# Patient Record
Sex: Female | Born: 1955 | Race: White | Hispanic: No | Marital: Married | Smoking: Never smoker
Health system: Southern US, Community
[De-identification: ages and names within clinical notes are randomized; demographics above are authoritative.]

## PROBLEM LIST (undated history)

## (undated) DIAGNOSIS — H6692 Otitis media, unspecified, left ear: Secondary | ICD-10-CM

## (undated) DIAGNOSIS — E079 Disorder of thyroid, unspecified: Secondary | ICD-10-CM

## (undated) DIAGNOSIS — E119 Type 2 diabetes mellitus without complications: Secondary | ICD-10-CM

## (undated) DIAGNOSIS — I729 Aneurysm of unspecified site: Secondary | ICD-10-CM

## (undated) DIAGNOSIS — F419 Anxiety disorder, unspecified: Secondary | ICD-10-CM

## (undated) HISTORY — PX: APPENDECTOMY: SHX54

## (undated) HISTORY — PX: DESCENDING AORTIC ANEURYSM REPAIR W/ STENT: SHX1456

---

## 2007-02-26 ENCOUNTER — Encounter (INDEPENDENT_AMBULATORY_CARE_PROVIDER_SITE_OTHER): Payer: Self-pay | Admitting: Specialist

## 2007-02-26 ENCOUNTER — Ambulatory Visit (HOSPITAL_BASED_OUTPATIENT_CLINIC_OR_DEPARTMENT_OTHER): Admission: RE | Admit: 2007-02-26 | Discharge: 2007-02-27 | Payer: Self-pay | Admitting: Specialist

## 2010-01-26 ENCOUNTER — Ambulatory Visit: Payer: Self-pay | Admitting: Radiology

## 2010-01-26 ENCOUNTER — Encounter: Payer: Self-pay | Admitting: Emergency Medicine

## 2010-01-27 ENCOUNTER — Observation Stay (HOSPITAL_COMMUNITY): Admission: EM | Admit: 2010-01-27 | Discharge: 2010-01-28 | Payer: Self-pay | Admitting: Internal Medicine

## 2010-12-14 LAB — BASIC METABOLIC PANEL
BUN: 12 mg/dL (ref 6–23)
CO2: 28 mEq/L (ref 19–32)
CO2: 26 mEq/L (ref 19–32)
Calcium: 8.9 mg/dL (ref 8.4–10.5)
Chloride: 105 mEq/L (ref 96–112)
Chloride: 102 mEq/L (ref 96–112)
Creatinine, Ser: 0.62 mg/dL (ref 0.4–1.2)
Creatinine, Ser: 0.7 mg/dL (ref 0.4–1.2)
GFR calc Af Amer: >60 mL/min (ref >60)
Glucose, Bld: 181 mg/dL — ABNORMAL HIGH (ref 70–99)
Glucose, Bld: 170 mg/dL — ABNORMAL HIGH (ref 70–99)
Potassium: 4.2 mEq/L (ref 3.5–5.1)

## 2010-12-14 LAB — COMPREHENSIVE METABOLIC PANEL
ALT: 22 U/L (ref 0–35)
AST: 30 U/L (ref 0–37)
Alkaline Phosphatase: 45 U/L (ref 39–117)
CO2: 23 mEq/L (ref 19–32)
Calcium: 8.8 mg/dL (ref 8.4–10.5)
Creatinine, Ser: 0.88 mg/dL (ref 0.4–1.2)
GFR calc non Af Amer: >60 mL/min (ref >60)
Glucose, Bld: 299 mg/dL — ABNORMAL HIGH (ref 70–99)
Potassium: 4.2 mEq/L (ref 3.5–5.1)

## 2010-12-14 LAB — CBC
HCT: 36.8 % (ref 36.0–46.0)
MCHC: 33.7 g/dL (ref 30.0–36.0)
MCV: 85.9 fL (ref 78.0–100.0)
RBC: 4.26 MIL/uL (ref 3.87–5.11)
RBC: 4.66 MIL/uL (ref 3.87–5.11)
RDW: 13.9 % (ref 11.5–15.5)
WBC: 9.1 10*3/uL (ref 4.0–10.5)

## 2010-12-14 LAB — DIFFERENTIAL
Basophils Absolute: 0 10*3/uL (ref 0.0–0.1)
Basophils Relative: 1 % (ref 0–1)
Eosinophils Absolute: 0 10*3/uL (ref 0.0–0.7)
Monocytes Absolute: 0.4 10*3/uL (ref 0.1–1.0)
Monocytes Relative: 4 % (ref 3–12)
Neutrophils Relative %: 85 % — ABNORMAL HIGH (ref 43–77)

## 2010-12-14 LAB — POCT I-STAT 3, ART BLOOD GAS (G3+)
Acid-base deficit: 2 mmol/L (ref 0.0–2.0)
O2 Saturation: 91 %
pCO2 arterial: 33.7 mmHg — ABNORMAL LOW (ref 35.0–45.0)

## 2010-12-14 LAB — GLUCOSE, CAPILLARY: Glucose-Capillary: 167 mg/dL — ABNORMAL HIGH (ref 70–99)

## 2010-12-14 LAB — HEMOGLOBIN A1C: Hgb A1c MFr Bld: 6.4 % — ABNORMAL HIGH (ref <5.7)

## 2011-02-11 NOTE — Op Note (Signed)
Amber Cruz, Amber Cruz                  ACCOUNT NO.:  [id number]   MEDICAL RECORD NO.:  [id number]          PATIENT TYPE:  AMB   LOCATION:  DSC                          FACILITY:  [facility]   PHYSICIAN:  Earvin Hansen L. Shon Hough, M.D.DATE OF BIRTH:  21-Jan-1956   DATE OF PROCEDURE:  02/26/2007  DATE OF DISCHARGE:                               OPERATIVE REPORT   HISTORY:  This patient has severe macromastia back and shoulder pain.  There are large pendulous breasts.  Has increase in accessory breast  tissue that is going around to the latissimus dorsal area up to the  axillary regions causing above intertriginous changes, chap, and  irritation.   PROCEDURES DONE:  Bilateral breast reduction using the inferior pedicle  technique.  Excision of accessory breast tissue with some liposuction  assistance.   ANESTHESIA:  General.   PREOPERATIVE:  The patient was set up and drawn for the inferior pedicle  reduction mammoplasty. We marked the nipple areolar complex back up to  20 cm from the suprasternal notch.  She then underwent general  anesthesia, intubated orally.  Prep was done to the chest, breast, and  back areas with Hibiclens soap and solution, walled off with sterile  towels and draped in satisfactory sterile field.  Xylocaine 0.25% with  epinephrine was injected locally, 1:400,000 concentration on 150 mL per  side.  This was allowed to set up.  The wound was scored then with a #10  blade.  The skin over the inferior pedicle was epithelialized with a #20  blade.  Medial and lateral fatty dermal pedicles were excised out down  to the pectoralis major fascia.  Accessory breast tissue was also  excised rectally in the lateral aspect and over the axillary and  latissimus dorsal area.  It was removed with liposuction assistance  using a [place] catheter length is 28-3 and 4's.  Removal of 150 mL of  excess breast tissue.  Appropriate hemostasis of fascia transposed and  stable with 3-0 Monocryl and  3-0 Prolene sutures.  Subcutaneous closure  was done with 3-0 Monocryl x2 layers, then ran a subcuticular stitch  with 3-0 Monocryl and 5-0 Monocryl throughout the inverted T.  The  wounds were drained with #10 Blake drains fully fluted, which were  placed in the depths of the wound to the lateral portion of the breast  and secured with 3-0 Prolene.  The wounds were cleansed.  Sterile strips  and soft dressing were applied with xeroform, 4 x 4, and ABDs,  hyperfixate.  She will then be taken to recovery for evaluation and care  and postop overnight stay.   ESTIMATED BLOOD LOSS:  Less than 100 mL.   COMPLICATIONS:  None.      Yaakov Guthrie. Shon Hough, M.D.  Electronically Signed     GLT/MEDQ  D:  02/26/2007  T:  02/26/2007  Job:  [id number]

## 2011-04-15 ENCOUNTER — Other Ambulatory Visit (HOSPITAL_COMMUNITY)
Admission: RE | Admit: 2011-04-15 | Discharge: 2011-04-15 | Disposition: A | Discharge status: Home / Self Care | Payer: Managed Care, Other (non HMO) | Source: Ambulatory Visit | Attending: Obstetrics and Gynecology | Admitting: Obstetrics and Gynecology

## 2011-04-15 ENCOUNTER — Other Ambulatory Visit: Payer: Self-pay | Admitting: Obstetrics and Gynecology

## 2011-04-15 DIAGNOSIS — Z01419 Encounter for gynecological examination (general) (routine) without abnormal findings: Secondary | ICD-10-CM | POA: Insufficient documentation

## 2014-03-26 DIAGNOSIS — F4322 Adjustment disorder with anxiety: Secondary | ICD-10-CM | POA: Insufficient documentation

## 2014-03-26 DIAGNOSIS — E039 Hypothyroidism, unspecified: Secondary | ICD-10-CM | POA: Insufficient documentation

## 2016-09-22 DIAGNOSIS — M25541 Pain in joints of right hand: Secondary | ICD-10-CM | POA: Insufficient documentation

## 2016-09-26 HISTORY — PX: MYRINGOTOMY: SHX2060

## 2017-08-11 DIAGNOSIS — E119 Type 2 diabetes mellitus without complications: Secondary | ICD-10-CM | POA: Insufficient documentation

## 2017-08-21 DIAGNOSIS — H6502 Acute serous otitis media, left ear: Secondary | ICD-10-CM | POA: Insufficient documentation

## 2017-11-10 ENCOUNTER — Emergency Department
Admission: EM | Admit: 2017-11-10 | Discharge: 2017-11-10 | Disposition: A | Discharge status: Home / Self Care | Payer: Managed Care, Other (non HMO) | Source: Home / Self Care | Attending: Emergency Medicine | Admitting: Emergency Medicine

## 2017-11-10 ENCOUNTER — Encounter: Payer: Self-pay | Admitting: Emergency Medicine

## 2017-11-10 ENCOUNTER — Emergency Department (INDEPENDENT_AMBULATORY_CARE_PROVIDER_SITE_OTHER): Payer: Managed Care, Other (non HMO)

## 2017-11-10 DIAGNOSIS — J342 Deviated nasal septum: Secondary | ICD-10-CM | POA: Diagnosis not present

## 2017-11-10 DIAGNOSIS — J101 Influenza due to other identified influenza virus with other respiratory manifestations: Secondary | ICD-10-CM

## 2017-11-10 DIAGNOSIS — R110 Nausea: Secondary | ICD-10-CM

## 2017-11-10 DIAGNOSIS — R05 Cough: Secondary | ICD-10-CM

## 2017-11-10 DIAGNOSIS — R059 Cough, unspecified: Secondary | ICD-10-CM

## 2017-11-10 HISTORY — DX: Type 2 diabetes mellitus without complications: E11.9

## 2017-11-10 LAB — POCT CBC W AUTO DIFF (K'VILLE URGENT CARE)

## 2017-11-10 LAB — POCT INFLUENZA A/B
INFLUENZA A, POC: POSITIVE — AB
INFLUENZA B, POC: NEGATIVE

## 2017-11-10 MED ORDER — ONDANSETRON 4 MG PO TBDP: 4.0000 mg | ORAL_TABLET | Freq: Three times a day (TID) | ORAL | 0 refills | Status: DC | PRN

## 2017-11-10 MED ORDER — BENZONATATE 100 MG PO CAPS: 100.0000 mg | ORAL_CAPSULE | Freq: Three times a day (TID) | ORAL | 0 refills | Status: DC | PRN

## 2017-11-10 MED ORDER — ONDANSETRON 4 MG PO TBDP
4.0000 mg | ORAL_TABLET | Freq: Once | ORAL | Status: AC
  Administered 2017-11-10: 4 mg via ORAL

## 2017-11-10 MED ORDER — OSELTAMIVIR PHOSPHATE 75 MG PO CAPS: 75.0000 mg | ORAL_CAPSULE | Freq: Two times a day (BID) | ORAL | 0 refills | Status: DC

## 2017-11-10 NOTE — ED Triage Notes (Signed)
Pt c/o cold sxs x1 week. States yesterday sinus pressure, HA and fever of 101. Tylenol this am.

## 2017-11-10 NOTE — Discharge Instructions (Addendum)
Take Tylenol every 6 hours as needed for headache. Take Tamiflu twice a day. You have medications for nausea and cough. Please drink good quantities of fluids during the day.

## 2017-11-10 NOTE — ED Provider Notes (Addendum)
[facility]    CSN: [id number] Arrival date & time: 11/10/17  0813     History   Chief Complaint Chief Complaint  Patient presents with  . Headache  Patient presents with a severe frontal headache. She states she was ill with a respiratory infectionthe last 2 weeks. This was associated with a scratchy throat and dry cough. She has a history of significant sinus problems and has a tube in her left ear. She has subsequently developed a purulent nasal drainage and at times a productive cough. Yesterday she started running fevers up to 101. She has had a flu shot. Her biggest problem has been her severe headache. Her headache has been associated with nausea but no vomiting. 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   HPI Amber Cruz is a 62 y.o. female.   HPI  Past Medical History:  Diagnosis Date  . Diabetes mellitus without complication (HCC)     There are no active problems to display for this patient.   History reviewed. No pertinent surgical history.  OB History    No data available       Home Medications    Prior to Admission medications   Medication Sig Start Date End Date Taking? Authorizing Provider  aspirin EC 81 MG tablet Take 81 mg by mouth daily.   Yes [provider]  clopidogrel (PLAVIX) 75 MG tablet Take 75 mg by mouth daily.   Yes [provider]  metFORMIN (GLUMETZA) 500 MG (MOD) 24 hr tablet Take 500 mg by mouth daily with breakfast.   Yes [provider]  sertraline (ZOLOFT) 100 MG tablet Take 100 mg by mouth daily.   Yes [provider]  benzonatate (TESSALON) 100 MG capsule Take 1-2 capsules (100-200 mg total) by mouth 3 (three) times daily as needed for cough. 11/10/17   Collene Gobbleaub, Christia Domke A, MD  ondansetron (ZOFRAN ODT) 4 MG disintegrating tablet Take 1 tablet (4 mg total) by mouth every 8 (eight) hours as  needed for nausea or vomiting. 11/10/17   Collene Gobbleaub, Kya Mayfield A, MD  oseltamivir (TAMIFLU) 75 MG capsule Take 1 capsule (75 mg total) by mouth every 12 (twelve) hours. 11/10/17   Collene Gobbleaub, Zebadiah Willert A, MD    Family History History reviewed. No pertinent family history.  Social History Social History   Tobacco Use  . Smoking status: Former Games developermoker  . Smokeless tobacco: Never Used  Substance Use Topics  . Alcohol use: No    Frequency: Never  . Drug use: Not on file     Allergies   Patient has no allergy information on record.   Review of Systems Review of Systems  Constitutional: Positive for fever.  HENT: Positive for congestion, postnasal drip, sinus pressure and sinus pain.   Eyes: Negative.   Respiratory: Positive for cough and shortness of breath. Negative for wheezing.   Cardiovascular: Negative for chest pain.  Gastrointestinal: Positive for nausea.  Endocrine:       She has a history of diabetes and checks her sugars twice a day.  Neurological: Positive for headaches.     Physical Exam Triage Vital Signs ED Triage Vitals [11/10/17 0847]  Enc Vitals Group     BP 121/73     Pulse Rate 80     Resp      Temp 98.8 F (37.1 C)     Temp Source Oral  SpO2 95 %     Weight 259 lb (117.5 kg)     Height      Head Circumference      Peak Flow      Pain Score 0     Pain Loc      Pain Edu?      Excl. in GC?    No data found.  Updated Vital Signs BP 121/73 (BP Location: Right Arm)   Pulse 80   Temp 98.8 F (37.1 C) (Oral)   Wt 259 lb (117.5 kg)   SpO2 95%   Visual Acuity Right Eye Distance:   Left Eye Distance:   Bilateral Distance:    Right Eye Near:   Left Eye Near:    Bilateral Near:     Physical Exam  Constitutional:  Patient is ill but not toxic appearing complaining of a significant frontal headache.  HENT:  Head: Normocephalic and atraumatic.  Mouth/Throat: Oropharynx is clear and moist.  Neck: Normal range of motion. Neck supple.  Cardiovascular:  Normal rate.  Pulmonary/Chest: Effort normal and breath sounds normal. No respiratory distress. She has no wheezes. She has no rales.  Abdominal: Soft.  Skin: Skin is warm and dry.   there is no maxillary sinus tenderness to palpation but there is significant tenderness over the frontal area.   UC Treatments / Results  Labs (all labs ordered are listed, but only abnormal results are displayed) Labs Reviewed  POCT INFLUENZA A/B - Abnormal; Notable for the following components:      Result Value   Influenza A, POC Positive (*)    All other components within normal limits  POCT CBC W AUTO DIFF ([facility])  POCT CBC W AUTO DIFF ([facility])   CBC done shows a white count of 6200 with 87% segs with a hemoglobin of 13.3 platelet count 194,000. EKG  EKG Interpretation None       Radiology Dg Sinuses Complete  Result Date: 11/10/2017 CLINICAL DATA:  Sinus drainage and pressure EXAM: PARANASAL SINUSES - COMPLETE 3 + VIEW COMPARISON:  None. FINDINGS: Marina Goodell, and lateral views obtained. Paranasal sinuses are clear. No air-fluid level. No bony destruction or expansion. Mastoid air cells are clear. There is slight rightward deviation of the nasal septum. IMPRESSION: Paranasal sinuses and mastoid air cells are clear. There is mild rightward deviation of the nasal septum. Electronically Signed   By: Bretta Bang III M.D.   On: 11/10/2017 10:01    Procedures Procedures (including critical care time)  Medications Ordered in UC Medications  ondansetron (ZOFRAN-ODT) disintegrating tablet 4 mg (4 mg Oral Given 11/10/17 1610)     Initial Impression / Assessment and Plan / UC Course  I have reviewed the triage vital signs and the nursing notes.  Pertinent labs & imaging results that were available during my care of the patient were reviewed by me and considered in my medical decision making (see chart for details). Patient presents with respiratory symptoms  going on for 2 weeks now yesterday she started with a fever associated with a periodic nasal drainage and mildly productive cough. Her main complaint today is of her severe headache which is in the frontal area and this area is very tender to touch. She has a tube in her left ear from being treated for chronic sinus infections. Her history is complicated by recent treatment with a coil of an aneurysm. She is currently on Plavix and aspirin for this. CBC was done and  had a normal total count but 87% segs. Flu swab will be done just because the count is normal. She is sent down for films of her sinuses. Sinus films look clear to me. Her flu test was positive for influenza A. She will be treated with Tamiflu, Tessalon Perles, and Zofran for nausea. She will take Tylenol for the headache.  sinus films are normal    Final Clinical Impressions(s) / UC Diagnoses   Final diagnoses:  Influenza A  Nausea without vomiting  Cough    ED Discharge Orders        Ordered    oseltamivir (TAMIFLU) 75 MG capsule  Every 12 hours     11/10/17 1004    benzonatate (TESSALON) 100 MG capsule  3 times daily PRN     11/10/17 1004    ondansetron (ZOFRAN ODT) 4 MG disintegrating tablet  Every 8 hours PRN     11/10/17 1004       Controlled Substance Prescriptions [place] Controlled Substance Registry consulted? Yes, I have consulted the [place] Controlled Substances Registry for this patient, and feel the risk/benefit ratio today is favorable for proceeding with this prescription for a controlled substance.   Collene Gobble, MD 11/10/17 1014    Collene Gobble, MD 11/10/17 1016

## 2017-11-16 ENCOUNTER — Encounter: Payer: Self-pay | Admitting: *Deleted

## 2017-11-16 ENCOUNTER — Emergency Department (INDEPENDENT_AMBULATORY_CARE_PROVIDER_SITE_OTHER): Payer: Managed Care, Other (non HMO)

## 2017-11-16 ENCOUNTER — Emergency Department
Admission: EM | Admit: 2017-11-16 | Discharge: 2017-11-16 | Disposition: A | Discharge status: Home / Self Care | Payer: Managed Care, Other (non HMO) | Source: Home / Self Care | Attending: Family Medicine | Admitting: Family Medicine

## 2017-11-16 ENCOUNTER — Other Ambulatory Visit: Payer: Self-pay

## 2017-11-16 DIAGNOSIS — R05 Cough: Secondary | ICD-10-CM | POA: Diagnosis not present

## 2017-11-16 DIAGNOSIS — R059 Cough, unspecified: Secondary | ICD-10-CM

## 2017-11-16 DIAGNOSIS — R0602 Shortness of breath: Secondary | ICD-10-CM

## 2017-11-16 MED ORDER — METHYLPREDNISOLONE SODIUM SUCC 125 MG IJ SOLR
80.0000 mg | Freq: Once | INTRAMUSCULAR | Status: AC
  Administered 2017-11-16: 80 mg via INTRAMUSCULAR

## 2017-11-16 MED ORDER — PREDNISONE 20 MG PO TABS: ORAL_TABLET | ORAL | 0 refills | Status: DC

## 2017-11-16 NOTE — ED Triage Notes (Signed)
Pt c/o lingering cough and SOB after flu dx last week.

## 2017-11-16 NOTE — Discharge Instructions (Signed)
Begin prednisone Friday 11/17/17. Take plain guaifenesin (1200mg  extended release tabs such as Mucinex) twice daily, with plenty of water, for cough and congestion.  May add Pseudoephedrine (30mg , one or two every 4 to 6 hours) for sinus congestion.  Get adequate rest.   May use Afrin nasal spray (or generic oxymetazoline) once daily for about 5 days and then discontinue.  Also recommend using saline nasal spray several times daily and saline nasal irrigation (AYR is a common brand).  Use Fluticasone nasal spray after using Afrin nasal spray and saline nasal irrigation. May take Delsym Cough Suppressant at bedtime for nighttime cough.  May take Tessalon perle with the Delsym Stop all antihistamines for now, and other non-prescription cough/cold preparations.

## 2017-11-16 NOTE — ED Provider Notes (Signed)
[name]    CSN: [id number] Arrival date & time: 11/16/17  1117     History   Chief Complaint Chief Complaint  Patient presents with  . Cough    HPI Amber Cruz is a 62 y.o. female.   Patient had the flu last week, and complains of persistent cough with tightness in her anterior chest.  She felt somewhat better yesterday.  No pleuritic pain but feels mild shortness of breath.  Her ears feel full.  She has a history of otitis media and has a left T-tube in place.  She notes that URI's tend to linger.   The history is provided by the patient.    Past Medical History:  Diagnosis Date  . Diabetes mellitus without complication (HCC)     There are no active problems to display for this patient.   History reviewed. No pertinent surgical history.  OB History    No data available       Home Medications    Prior to Admission medications   Medication Sig Start Date End Date Taking? Authorizing Provider  aspirin EC 81 MG tablet Take 81 mg by mouth daily.   Yes [provider]  clopidogrel (PLAVIX) 75 MG tablet Take 75 mg by mouth daily.   Yes [provider]  levothyroxine (SYNTHROID, LEVOTHROID) 137 MCG tablet Take 137 mcg by mouth daily before breakfast.   Yes [provider]  metFORMIN (GLUMETZA) 500 MG (MOD) 24 hr tablet Take 500 mg by mouth daily with breakfast.    [provider]  ondansetron (ZOFRAN ODT) 4 MG disintegrating tablet Take 1 tablet (4 mg total) by mouth every 8 (eight) hours as needed for nausea or vomiting. 11/10/17   Collene Gobble, MD  predniSONE (DELTASONE) 20 MG tablet Take one tab by mouth twice daily for 4 days, then one daily for 3 days. Take with food. 11/16/17   Lattie Haw, MD  sertraline (ZOLOFT) 100 MG tablet Take 100 mg by mouth daily.    [provider]    Family History History reviewed. No pertinent family history.  Social History Social History   Tobacco Use  .  Smoking status: Former Games developer  . Smokeless tobacco: Never Used  Substance Use Topics  . Alcohol use: No    Frequency: Never  . Drug use: No     Allergies   Patient has no known allergies.   Review of Systems Review of Systems No sore throat + cough No pleuritic pain, but feels tight in anterior chest No wheezing + nasal congestion + post-nasal drainage No sinus pain/pressure No itchy/red eyes No earache, but ears feel clogged. No hemoptysis No SOB No fever/chills No nausea No vomiting No abdominal pain No diarrhea No urinary symptoms No skin rash + fatigue No myalgias No headache Used OTC meds without relief   Physical Exam Triage Vital Signs ED Triage Vitals  Enc Vitals Group     BP 11/16/17 1136 136/79     Pulse Rate 11/16/17 1136 67     Resp 11/16/17 1136 16     Temp 11/16/17 1136 98.2 F (36.8 C)     Temp Source 11/16/17 1136 Oral     SpO2 11/16/17 1136 95 %     Weight 11/16/17 1138 260 lb (117.9 kg)     Height 11/16/17 1138 5\' 6"  (1.676 m)     Head Circumference --      Peak Flow --  Pain Score 11/16/17 1138 0     Pain Loc --      Pain Edu? --      Excl. in GC? --    No data found.  Updated Vital Signs BP 136/79   Pulse 67   Temp 98.2 F (36.8 C) (Oral)   Resp 16   Ht 5\' 6"  (1.676 m)   Wt 260 lb (117.9 kg)   SpO2 95%   BMI 41.97 kg/m   Visual Acuity Right Eye Distance:   Left Eye Distance:   Bilateral Distance:    Right Eye Near:   Left Eye Near:    Bilateral Near:     Physical Exam Nursing notes and Vital Signs reviewed. Appearance:  Patient appears stated age, and in no acute distress Eyes:  Pupils are equal, round, and reactive to light and accomodation.  Extraocular movement is intact.  Conjunctivae are not inflamed  Ears:  Canals normal.  Right tympanic membrane normal.  Left tympanic membrane has T-tube in place without drainage. Nose:  Mildly congested turbinates.  No sinus tenderness.    Pharynx:  Normal Neck:   Supple.  No adenopathy.  Lungs:  Clear to auscultation.  Breath sounds are equal.  Moving air well. Heart:  Regular rate and rhythm without murmurs, rubs, or gallops.  Abdomen:  Nontender without masses or hepatosplenomegaly.  Bowel sounds are present.  No CVA or flank tenderness.  Extremities:  No edema.  Skin:  No rash present.    UC Treatments / Results  Labs (all labs ordered are listed, but only abnormal results are displayed) Labs Reviewed - No data to display  EKG  EKG Interpretation None       Radiology Dg Chest 2 View  Result Date: 11/16/2017 CLINICAL DATA:  Cough and short of breath EXAM: CHEST  2 VIEW COMPARISON:  08/25/2015 FINDINGS: The heart size and mediastinal contours are within normal limits. Both lungs are clear. The visualized skeletal structures are unremarkable. IMPRESSION: No active cardiopulmonary disease. Electronically Signed   By: Marlan Palauharles  Clark M.D.   On: 11/16/2017 11:56    Procedures Procedures (including critical care time)  Medications Ordered in UC Medications  methylPREDNISolone sodium succinate (SOLU-MEDROL) 125 mg/2 mL injection 80 mg (80 mg Intramuscular Given 11/16/17 1242)     Initial Impression / Assessment and Plan / UC Course  I have reviewed the triage vital signs and the nursing notes.  Pertinent labs & imaging results that were available during my care of the patient were reviewed by me and considered in my medical decision making (see chart for details).    Influenza resolving.  Negative chest X-ray reassuring.  Appears to have post-infectious cough. Administered Solumedrol 80mg  IM. Begin prednisone burst/taper Friday 11/17/17. Take plain guaifenesin (1200mg  extended release tabs such as Mucinex) twice daily, with plenty of water, for cough and congestion.  May add Pseudoephedrine (30mg , one or two every 4 to 6 hours) for sinus congestion.  Get adequate rest.   May use Afrin nasal spray (or generic oxymetazoline) once daily for  about 5 days and then discontinue.  Also recommend using saline nasal spray several times daily and saline nasal irrigation (AYR is a common brand).  Use Fluticasone nasal spray after using Afrin nasal spray and saline nasal irrigation. May take Delsym Cough Suppressant at bedtime for nighttime cough.  May take Tessalon perle with the Delsym Stop all antihistamines for now, and other non-prescription cough/cold preparations. Followup with Family Doctor if not improved in  one week.     Final Clinical Impressions(s) / UC Diagnoses   Final diagnoses:  Cough    ED Discharge Orders        Ordered    predniSONE (DELTASONE) 20 MG tablet     11/16/17 1239          Lattie Haw, MD 11/19/17 2038

## 2018-01-29 DIAGNOSIS — I671 Cerebral aneurysm, nonruptured: Secondary | ICD-10-CM | POA: Insufficient documentation

## 2018-05-09 ENCOUNTER — Other Ambulatory Visit: Payer: Self-pay

## 2018-05-09 ENCOUNTER — Emergency Department
Admission: EM | Admit: 2018-05-09 | Discharge: 2018-05-09 | Disposition: A | Discharge status: Home / Self Care | Payer: Managed Care, Other (non HMO) | Source: Home / Self Care | Attending: Family Medicine | Admitting: Family Medicine

## 2018-05-09 DIAGNOSIS — H9202 Otalgia, left ear: Secondary | ICD-10-CM | POA: Diagnosis not present

## 2018-05-09 DIAGNOSIS — Z9622 Myringotomy tube(s) status: Secondary | ICD-10-CM

## 2018-05-09 MED ORDER — AMOXICILLIN-POT CLAVULANATE 875-125 MG PO TABS: 1.0000 | ORAL_TABLET | Freq: Two times a day (BID) | ORAL | 0 refills | Status: DC

## 2018-05-09 NOTE — ED Triage Notes (Signed)
Tube was placed in left ear in January.  About 10 days ago, started having pain in her left ear.  Sunday had a small amount of bloody drainage.  Today pain has been constant.

## 2018-05-09 NOTE — ED Provider Notes (Signed)
[name]    CSN: [id number] Arrival date & time: 05/09/18  1915     History   Chief Complaint Chief Complaint  Patient presents with  . Otalgia    HPI Amber Cruz is a 62 y.o. female.   HPI  Amber Cruz is a 62 y.o. female presenting to UC with c/o Left ear pain for about 10 days.  She had a small amount of bloody drainage 3 days ago on her pillow.  Today the pain has been a constant ache, radiating down the Left side of her face.  Denies fever, chills, cough. She has mild congestion. She did have an ear tube placed in her Left ear in January 2019.  This is her second set in two years. The first set lasted about 8-9 months before falling out on its own.    Past Medical History:  Diagnosis Date  . Diabetes mellitus without complication (HCC)     There are no active problems to display for this patient.   History reviewed. No pertinent surgical history.  OB History   None      Home Medications    Prior to Admission medications   Medication Sig Start Date End Date Taking? Authorizing Provider  amoxicillin-clavulanate (AUGMENTIN) 875-125 MG tablet Take 1 tablet by mouth 2 (two) times daily. One po bid x 7 days 05/09/18   Lurene Shadow, PA-C  aspirin EC 81 MG tablet Take 81 mg by mouth daily.    [provider]  clopidogrel (PLAVIX) 75 MG tablet Take 75 mg by mouth daily.    [provider]  levothyroxine (SYNTHROID, LEVOTHROID) 137 MCG tablet Take 137 mcg by mouth daily before breakfast.    [provider]  metFORMIN (GLUMETZA) 500 MG (MOD) 24 hr tablet Take 500 mg by mouth daily with breakfast.    [provider]  ondansetron (ZOFRAN ODT) 4 MG disintegrating tablet Take 1 tablet (4 mg total) by mouth every 8 (eight) hours as needed for nausea or vomiting. 11/10/17   Collene Gobble, MD  predniSONE (DELTASONE) 20 MG tablet Take one tab by mouth twice daily for 4 days, then one daily for 3 days. Take with food. 11/16/17    Lattie Haw, MD  sertraline (ZOLOFT) 100 MG tablet Take 100 mg by mouth daily.    [provider]    Family History History reviewed. No pertinent family history.  Social History Social History   Tobacco Use  . Smoking status: Former Games developer  . Smokeless tobacco: Never Used  Substance Use Topics  . Alcohol use: No    Frequency: Never  . Drug use: No     Allergies   Patient has no known allergies.   Review of Systems Review of Systems  Constitutional: Negative for chills and fever.  HENT: Positive for congestion ( minimal), ear pain ( Left) and sinus pressure ( Left side). Negative for postnasal drip, rhinorrhea, sinus pain and sore throat.   Respiratory: Negative for cough and wheezing.   Gastrointestinal: Negative for nausea and vomiting.  Neurological: Negative for dizziness, light-headedness and headaches.     Physical Exam Triage Vital Signs ED Triage Vitals [05/09/18 1950]  Enc Vitals Group     BP 124/75     Pulse Rate 63     Resp      Temp 97.8 F (36.6 C)     Temp Source Oral     SpO2 95 %     Weight 225  lb (102.1 kg)     Height 5\' 6"  (1.676 m)     Head Circumference      Peak Flow      Pain Score 8     Pain Loc      Pain Edu?      Excl. in GC?    No data found.  Updated Vital Signs BP 124/75 (BP Location: Right Arm)   Pulse 63   Temp 97.8 F (36.6 C) (Oral)   Ht 5\' 6"  (1.676 m)   Wt 225 lb (102.1 kg)   SpO2 95%   BMI 36.32 kg/m   Visual Acuity Right Eye Distance:   Left Eye Distance:   Bilateral Distance:    Right Eye Near:   Left Eye Near:    Bilateral Near:     Physical Exam  Constitutional: She is oriented to person, place, and time. She appears well-developed and well-nourished.  HENT:  Head: Normocephalic and atraumatic.  Right Ear: Tympanic membrane normal.  Left Ear: Tympanic membrane is not erythematous and not bulging.  Left: tube in place, appears to be slightly at an angle. Scant dried blood below tube  but no active bleeding or drainage. TM- besides tube in place, appears normal. No erythema or bulging.   Eyes: EOM are normal.  Neck: Normal range of motion. Neck supple.  Cardiovascular: Normal rate and regular rhythm.  Pulmonary/Chest: Effort normal and breath sounds normal. No stridor. No respiratory distress. She has no wheezes. She has no rales.  Musculoskeletal: Normal range of motion.  Neurological: She is alert and oriented to person, place, and time.  Skin: Skin is warm and dry.  Psychiatric: She has a normal mood and affect. Her behavior is normal.  Nursing note and vitals reviewed.    UC Treatments / Results  Labs (all labs ordered are listed, but only abnormal results are displayed) Labs Reviewed - No data to display  EKG None  Radiology No results found.  Procedures Procedures (including critical care time)  Medications Ordered in UC Medications - No data to display  Initial Impression / Assessment and Plan / UC Course  I have reviewed the triage vital signs and the nursing notes.  Pertinent labs & imaging results that were available during my care of the patient were reviewed by me and considered in my medical decision making (see chart for details).     No evidence of infection at this time. Question if tube is starting to work its way out because it is currently at an angle but still in place. No bleeding or drainage. No erythema Encouraged  Symptomatic treatment at this time.  Prescription to hold with expiration date for Augmentin.  Final Clinical Impressions(s) / UC Diagnoses   Final diagnoses:  Otalgia of left ear  History of placement of ear tubes     Discharge Instructions      You do not appear to have an ear infection at this time but you may be developing an early sinus infection and the ear tube is at a slight angle, it may be gradually working its way out but there is currently no active bleeding or drainage.  If you develop a fever,  worsening pain, or no improvement of pain over the next 3-5 days, you may start the antibiotic. If you start taking the antibiotic, please complete the entire course, even if you start to feel better, to ensure the infection does not come back.  If not improving in 7-10 days,  please follow up with family medicine or your ENT.    ED Prescriptions    Medication Sig Dispense Auth. Provider   amoxicillin-clavulanate (AUGMENTIN) 875-125 MG tablet Take 1 tablet by mouth 2 (two) times daily. One po bid x 7 days 14 tablet Lurene ShadowPhelps, Alicianna Litchford O, [place]PA-C     Controlled Substance Prescriptions [place] Controlled Substance Registry consulted? Not Applicable   Rolla Platehelps, Kimyah Frein O, PA-C 05/10/18 1408

## 2018-05-09 NOTE — Discharge Instructions (Signed)
°  You do not appear to have an ear infection at this time but you may be developing an early sinus infection and the ear tube is at a slight angle, it may be gradually working its way out but there is currently no active bleeding or drainage.  If you develop a fever, worsening pain, or no improvement of pain over the next 3-5 days, you may start the antibiotic. If you start taking the antibiotic, please complete the entire course, even if you start to feel better, to ensure the infection does not come back.  If not improving in 7-10 days, please follow up with family medicine or your ENT.

## 2018-07-10 ENCOUNTER — Emergency Department (INDEPENDENT_AMBULATORY_CARE_PROVIDER_SITE_OTHER): Payer: Managed Care, Other (non HMO)

## 2018-07-10 ENCOUNTER — Emergency Department
Admission: EM | Admit: 2018-07-10 | Discharge: 2018-07-10 | Disposition: A | Discharge status: Home / Self Care | Payer: Managed Care, Other (non HMO) | Source: Home / Self Care | Attending: Family Medicine | Admitting: Family Medicine

## 2018-07-10 ENCOUNTER — Encounter: Payer: Self-pay | Admitting: Emergency Medicine

## 2018-07-10 ENCOUNTER — Other Ambulatory Visit: Payer: Self-pay

## 2018-07-10 DIAGNOSIS — J069 Acute upper respiratory infection, unspecified: Secondary | ICD-10-CM | POA: Diagnosis not present

## 2018-07-10 DIAGNOSIS — B9789 Other viral agents as the cause of diseases classified elsewhere: Secondary | ICD-10-CM | POA: Diagnosis not present

## 2018-07-10 DIAGNOSIS — R05 Cough: Secondary | ICD-10-CM

## 2018-07-10 DIAGNOSIS — R062 Wheezing: Secondary | ICD-10-CM

## 2018-07-10 MED ORDER — METHYLPREDNISOLONE SODIUM SUCC 40 MG IJ SOLR
80.0000 mg | Freq: Once | INTRAMUSCULAR | Status: AC
  Administered 2018-07-10: 80 mg via INTRAMUSCULAR

## 2018-07-10 MED ORDER — PREDNISONE 20 MG PO TABS: ORAL_TABLET | ORAL | 0 refills | Status: DC

## 2018-07-10 NOTE — ED Triage Notes (Signed)
Cough, ear pain, congestion x 3 days

## 2018-07-10 NOTE — ED Provider Notes (Signed)
[facility]    CSN: [id number] Arrival date & time: 07/10/18  0955     History   Chief Complaint Chief Complaint  Patient presents with  . Cough    HPI Amber Cruz is a 62 y.o. female.   HPI Amber Cruz is a 62 y.o. female presenting to UC with c/o 3 days cough, congestion with mild chest tightness/soreness from cough, and ear pain. Associated nasal congestion and post-nasal drip. Denies fever but did have chills this morning. 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  No prior hx of asthma or pneumonia but she has done well with steroid shot and prednisone for cough in the past.     Past Medical History:  Diagnosis Date  . Diabetes mellitus without complication (HCC)     There are no active problems to display for this patient.   History reviewed. No pertinent surgical history.  OB History   None      Home Medications    Prior to Admission medications   Medication Sig Start Date End Date Taking? Authorizing Provider  aspirin EC 81 MG tablet Take 81 mg by mouth daily.    [provider]  clopidogrel (PLAVIX) 75 MG tablet Take 75 mg by mouth daily.    [provider]  levothyroxine (SYNTHROID, LEVOTHROID) 137 MCG tablet Take 137 mcg by mouth daily before breakfast.    [provider]  metFORMIN (GLUMETZA) 500 MG (MOD) 24 hr tablet Take 500 mg by mouth daily with breakfast.    [provider]  predniSONE (DELTASONE) 20 MG tablet 3 tabs po day one, then 2 po daily x 4 days 07/10/18   Lurene Shadow, PA-C  sertraline (ZOLOFT) 100 MG tablet Take 100 mg by mouth daily.    [provider]    Family History No family history on file.  Social History Social History   Tobacco Use  . Smoking status: Former Games developer  . Smokeless tobacco: Never Used  Substance Use Topics  . Alcohol use: No    Frequency: Never  . Drug use: No     Allergies   Patient has no  known allergies.   Review of Systems Review of Systems  Constitutional: Positive for chills. Negative for fever.  HENT: Positive for congestion, ear pain, postnasal drip and sore throat. Negative for trouble swallowing and voice change.   Respiratory: Positive for cough. Negative for shortness of breath.   Cardiovascular: Negative for chest pain and palpitations.  Gastrointestinal: Negative for abdominal pain, diarrhea, nausea and vomiting.  Musculoskeletal: Negative for arthralgias, back pain and myalgias.  Skin: Negative for rash.     Physical Exam Triage Vital Signs ED Triage Vitals  Enc Vitals Group     BP 07/10/18 1007 119/81     Pulse Rate 07/10/18 1007 71     Resp --      Temp 07/10/18 1007 99.1 F (37.3 C)     Temp Source 07/10/18 1007 Oral     SpO2 07/10/18 1007 96 %     Weight 07/10/18 1008 245 lb (111.1 kg)     Height 07/10/18 1008 5\' 6"  (1.676 m)     Head Circumference --      Peak Flow --      Pain Score 07/10/18 1008 2     Pain Loc --      Pain Edu? --      Excl. in GC? --  No data found.  Updated Vital Signs BP 119/81 (BP Location: Right Arm)   Pulse 71   Temp 99.1 F (37.3 C) (Oral)   Ht 5\' 6"  (1.676 m)   Wt 245 lb (111.1 kg)   SpO2 96%   BMI 39.54 kg/m   Visual Acuity Right Eye Distance:   Left Eye Distance:   Bilateral Distance:    Right Eye Near:   Left Eye Near:    Bilateral Near:     Physical Exam  Constitutional: She is oriented to person, place, and time. She appears well-developed and well-nourished. No distress.  HENT:  Head: Normocephalic and atraumatic.  Right Ear: Tympanic membrane normal.  Left Ear: Tympanic membrane normal.  Nose: Nose normal. Right sinus exhibits no maxillary sinus tenderness and no frontal sinus tenderness. Left sinus exhibits no maxillary sinus tenderness and no frontal sinus tenderness.  Mouth/Throat: Uvula is midline, oropharynx is clear and moist and mucous membranes are normal.  Eyes: EOM are  normal.  Neck: Normal range of motion. Neck supple.  Cardiovascular: Normal rate and regular rhythm.  Pulmonary/Chest: Effort normal. No stridor. No respiratory distress. She has wheezes ( lower lung fields bilaterally, Right > Left).  Musculoskeletal: Normal range of motion.  Lymphadenopathy:    She has no cervical adenopathy.  Neurological: She is alert and oriented to person, place, and time.  Skin: Skin is warm and dry. She is not diaphoretic.  Psychiatric: She has a normal mood and affect. Her behavior is normal.  Nursing note and vitals reviewed.    UC Treatments / Results  Labs (all labs ordered are listed, but only abnormal results are displayed) Labs Reviewed - No data to display  EKG None  Radiology Dg Chest 2 View  Result Date: 07/10/2018 CLINICAL DATA:  Cough, congestion, wheezing EXAM: CHEST - 2 VIEW COMPARISON:  11/16/2017 FINDINGS: Heart is enlarged. Persistent stable linear densities in the lung bases, likely scarring. No effusions or acute confluent airspace opacities. No acute bony abnormality. IMPRESSION: Stable mild cardiomegaly. Stable bibasilar scarring. No active disease. Electronically Signed   By: Charlett Nose M.D.   On: 07/10/2018 10:48    Procedures Procedures (including critical care time)  Medications Ordered in UC Medications  methylPREDNISolone sodium succinate (SOLU-MEDROL) 40 mg/mL injection 80 mg (80 mg Intramuscular Given 07/10/18 1124)    Initial Impression / Assessment and Plan / UC Course  I have reviewed the triage vital signs and the nursing notes.  Pertinent labs & imaging results that were available during my care of the patient were reviewed by me and considered in my medical decision making (see chart for details).     Reviewed CXR with pt Will tx for viral illness Solumedrol 80mg  IM given in UC Home care instructions provided.  Final Clinical Impressions(s) / UC Diagnoses   Final diagnoses:  Viral URI with cough      Discharge Instructions      Please follow up with family medicine in 1 week if not improving.     ED Prescriptions    Medication Sig Dispense Auth. Provider   predniSONE (DELTASONE) 20 MG tablet 3 tabs po day one, then 2 po daily x 4 days 11 tablet Lurene Shadow, PA-C     Controlled Substance Prescriptions [place] Controlled Substance Registry consulted? Not Applicable   Rolla Plate 07/10/18 1130

## 2018-07-10 NOTE — Discharge Instructions (Signed)
°  Please follow up with family medicine in 1 week if not improving. °

## 2018-11-22 ENCOUNTER — Telehealth: Payer: Self-pay

## 2018-11-22 NOTE — Telephone Encounter (Signed)
Copied from CRM [id number]. Topic: Appointment Scheduling - New Patient >> Nov 21, 2018 12:16 PM [name] E wrote: New patient would like to be scheduled for your office. Provider: Dr. Abner Greenspan Pt was referred by Gabriel Rung and Gardiner Coins and Pt's PCP Dr. Lendon Colonel is retiring and referred her to Dr. Abner Greenspan as well / please advise   Route to department's PEC pool.  \  Kirsten- Dr. Abner Greenspan is currently not accepting new patients at this time.  Please let patient know

## 2018-11-23 NOTE — Telephone Encounter (Signed)
CALLED PATIENT TO INFORM OF DR. Abner Greenspan NOT ACCEPTING NEW PATIENTS. Informed patient of Dr. Carmelia Roller and Esperanza Richters who are accepting patients. She stated she wanted to think on it and call us back. Informed of [place] not prescribing controlled substances.

## 2019-01-01 ENCOUNTER — Encounter: Payer: Self-pay | Admitting: Emergency Medicine

## 2019-01-01 ENCOUNTER — Other Ambulatory Visit: Payer: Self-pay

## 2019-01-01 ENCOUNTER — Emergency Department
Admission: EM | Admit: 2019-01-01 | Discharge: 2019-01-01 | Disposition: A | Discharge status: Home / Self Care | Payer: Managed Care, Other (non HMO) | Source: Home / Self Care

## 2019-01-01 DIAGNOSIS — R3915 Urgency of urination: Secondary | ICD-10-CM

## 2019-01-01 DIAGNOSIS — N3289 Other specified disorders of bladder: Secondary | ICD-10-CM | POA: Diagnosis not present

## 2019-01-01 LAB — POCT URINALYSIS DIP (MANUAL ENTRY)
Bilirubin, UA: NEGATIVE
Blood, UA: NEGATIVE
Glucose, UA: NEGATIVE mg/dL
Ketones, POC UA: NEGATIVE mg/dL
Leukocytes, UA: NEGATIVE
Nitrite, UA: NEGATIVE
Protein Ur, POC: NEGATIVE mg/dL
Spec Grav, UA: 1.015 (ref 1.010–1.025)
Urobilinogen, UA: 0.2 E.U./dL
pH, UA: 7 (ref 5.0–8.0)

## 2019-01-01 MED ORDER — PHENAZOPYRIDINE HCL 200 MG PO TABS: 200.0000 mg | ORAL_TABLET | Freq: Three times a day (TID) | ORAL | 0 refills | Status: DC

## 2019-01-01 NOTE — ED Provider Notes (Signed)
[name]    CSN: [id number] Arrival date & time: 01/01/19  0840     History   Chief Complaint Chief Complaint  Patient presents with  . Polyuria    HPI Amber Cruz is a 63 y.o. female.   HPI Amber Cruz is a 63 y.o. female presenting to UC with c/o bladder spasms with urinary frequency and urgency for the last 3-4 days. Hx of overactive bladder about 2 years ago.  Denies fever, chills, abdominal pain, back pain, or pain with urination. No medication tried PTA. Pt believes she was prescribed pyridium last time she had similar symptoms.   Past Medical History:  Diagnosis Date  . Diabetes mellitus without complication (HCC)     There are no active problems to display for this patient.   History reviewed. No pertinent surgical history.  OB History   No obstetric history on file.      Home Medications    Prior to Admission medications   Medication Sig Start Date End Date Taking? Authorizing Provider  aspirin EC 81 MG tablet Take 81 mg by mouth daily.    [provider]  levothyroxine (SYNTHROID, LEVOTHROID) 137 MCG tablet Take 137 mcg by mouth daily before breakfast.    [provider]  phenazopyridine (PYRIDIUM) 200 MG tablet Take 1 tablet (200 mg total) by mouth 3 (three) times daily. 01/01/19   Lurene Shadow, PA-C  sertraline (ZOLOFT) 100 MG tablet Take 100 mg by mouth daily.    [provider]    Family History History reviewed. No pertinent family history.  Social History Social History   Tobacco Use  . Smoking status: Former Games developer  . Smokeless tobacco: Never Used  Substance Use Topics  . Alcohol use: No    Frequency: Never  . Drug use: No     Allergies   Patient has no known allergies.   Review of Systems Review of Systems  Constitutional: Negative for chills and fever.  Gastrointestinal: Negative for abdominal pain, diarrhea, nausea and vomiting.  Genitourinary: Positive for frequency and  urgency. Negative for dysuria, flank pain, hematuria and pelvic pain.  Neurological: Negative for dizziness, light-headedness and headaches.     Physical Exam Triage Vital Signs ED Triage Vitals  Enc Vitals Group     BP 01/01/19 0923 124/83     Pulse Rate 01/01/19 0923 73     Resp --      Temp 01/01/19 0923 98.8 F (37.1 C)     Temp Source 01/01/19 0923 Oral     SpO2 01/01/19 0923 96 %     Weight 01/01/19 0924 225 lb (102.1 kg)     Height 01/01/19 0924 5\' 6"  (1.676 m)     Head Circumference --      Peak Flow --      Pain Score 01/01/19 0924 6     Pain Loc --      Pain Edu? --      Excl. in GC? --    No data found.  Updated Vital Signs BP 124/83 (BP Location: Right Arm)   Pulse 73   Temp 98.8 F (37.1 C) (Oral)   Ht 5\' 6"  (1.676 m)   Wt 225 lb (102.1 kg)   SpO2 96%   BMI 36.32 kg/m   Visual Acuity Right Eye Distance:   Left Eye Distance:   Bilateral Distance:    Right Eye Near:   Left Eye Near:    Bilateral Near:  Physical Exam Vitals signs and nursing note reviewed.  Constitutional:      Appearance: Normal appearance. She is well-developed.  HENT:     Head: Normocephalic and atraumatic.  Neck:     Musculoskeletal: Normal range of motion.  Cardiovascular:     Rate and Rhythm: Normal rate and regular rhythm.  Pulmonary:     Effort: Pulmonary effort is normal.     Breath sounds: Normal breath sounds.  Abdominal:     General: There is no distension.     Palpations: Abdomen is soft.     Tenderness: There is no abdominal tenderness. There is no right CVA tenderness or left CVA tenderness.  Musculoskeletal: Normal range of motion.  Skin:    General: Skin is warm and dry.  Neurological:     Mental Status: She is alert and oriented to person, place, and time.  Psychiatric:        Behavior: Behavior normal.      UC Treatments / Results  Labs (all labs ordered are listed, but only abnormal results are displayed) Labs Reviewed  URINE CULTURE     EKG None  Radiology No results found.  Procedures Procedures (including critical care time)  Medications Ordered in UC Medications - No data to display  Initial Impression / Assessment and Plan / UC Course  I have reviewed the triage vital signs and the nursing notes.  Pertinent labs & imaging results that were available during my care of the patient were reviewed by me and considered in my medical decision making (see chart for details).     UA: no evidence of UTI Will tx symptomatically   Final Clinical Impressions(s) / UC Diagnoses   Final diagnoses:  Bladder spasms  Urinary urgency     Discharge Instructions      Please take this medication as prescribed.  Please call your family doctor later this week if not improving.  Limit or avoid caffeine which can cause urinary symptoms to worsen.    ED Prescriptions    Medication Sig Dispense Auth. Provider   phenazopyridine (PYRIDIUM) 200 MG tablet Take 1 tablet (200 mg total) by mouth 3 (three) times daily. 6 tablet Lurene ShadowPhelps, Jory Welke O, PA-C     Controlled Substance Prescriptions [place] Controlled Substance Registry consulted? Not Applicable   Rolla Platehelps, Tinamarie Przybylski O, PA-C 01/01/19 1006

## 2019-01-01 NOTE — ED Triage Notes (Signed)
Bladder spasms x 4 days

## 2019-01-01 NOTE — Discharge Instructions (Signed)
°  Please take this medication as prescribed.  Please call your family doctor later this week if not improving.  Limit or avoid caffeine which can cause urinary symptoms to worsen.

## 2019-01-03 ENCOUNTER — Telehealth: Payer: Self-pay

## 2019-01-03 LAB — URINE CULTURE
MICRO NUMBER:: 380385
SPECIMEN QUALITY:: ADEQUATE

## 2019-01-03 NOTE — Telephone Encounter (Signed)
Called patient today in regards to labwork from recent visit.  No answer, left voicemail to return call

## 2019-01-03 NOTE — Telephone Encounter (Signed)
Patient returned call, informed her of lab results and if she needs then to return to clinic

## 2019-06-17 ENCOUNTER — Encounter: Payer: Self-pay | Admitting: Emergency Medicine

## 2019-06-17 ENCOUNTER — Emergency Department
Admission: EM | Admit: 2019-06-17 | Discharge: 2019-06-17 | Disposition: A | Discharge status: Home / Self Care | Payer: Managed Care, Other (non HMO) | Source: Home / Self Care | Attending: Family Medicine | Admitting: Family Medicine

## 2019-06-17 ENCOUNTER — Other Ambulatory Visit: Payer: Self-pay

## 2019-06-17 DIAGNOSIS — H1032 Unspecified acute conjunctivitis, left eye: Secondary | ICD-10-CM

## 2019-06-17 MED ORDER — SULFACETAMIDE SODIUM 10 % OP SOLN: 1.0000 [drp] | OPHTHALMIC | 0 refills | Status: DC

## 2019-06-17 NOTE — ED Triage Notes (Signed)
LT eye red, scratchy, painful, watery, started yesterday, today it was swollen shut, matted. RT eye is starting to bother me

## 2019-06-17 NOTE — ED Provider Notes (Signed)
[facility]    CSN: [id number] Arrival date & time: 06/17/19  2542      History   Chief Complaint Chief Complaint  Patient presents with   Eye Problem    HPI Amber Cruz is a 63 y.o. female.   Patient reports that her left eye felt "dry" yesterday after she removed her contact lens.  Today she has had increased left eye redness and mucoid discharge in her left eye this morning.  She denies lid pain and foreign body sensation.  Her right eye felt minimally "scratchy" today.  No changes in vision.  The history is provided by the patient.  Eye Problem Location:  Left eye Quality: feels dry. Severity:  Mild Onset quality:  Sudden Duration:  1 day Timing:  Constant Progression:  Worsening Chronicity:  New Context: contact lenses   Context: not burn, not chemical exposure, not direct trauma, not foreign body, not using machinery, not scratch, not smoke exposure and not UV exposure   Relieved by:  Nothing Worsened by:  Nothing Ineffective treatments:  None tried Associated symptoms: crusting, discharge, redness, swelling and tearing   Associated symptoms: no blurred vision, no decreased vision, no double vision, no facial rash, no headaches, no itching, no photophobia and no scotomas   Risk factors: no conjunctival hemorrhage, no exposure to pinkeye, no previous injury to eye and no recent URI     History reviewed. No pertinent past medical history.  There are no active problems to display for this patient.   History reviewed. No pertinent surgical history.  OB History   No obstetric history on file.      Home Medications    Prior to Admission medications   Medication Sig Start Date End Date Taking? Authorizing Provider  aspirin EC 81 MG tablet Take 81 mg by mouth daily.    [provider]  levothyroxine (SYNTHROID, LEVOTHROID) 137 MCG tablet Take 137 mcg by mouth daily before breakfast.    [provider]  sertraline (ZOLOFT) 100  MG tablet Take 100 mg by mouth daily.    [provider]  sulfacetamide (BLEPH-10) 10 % ophthalmic solution Place 1-2 drops into both eyes every 4 (four) hours. (While awake) 06/17/19   Tor[place] Sponsel, Ishmael Holter, MD    Family History Family History  Problem Relation Age of Onset   Alzheimer's disease Mother    Hypertension Father    Heart failure Father     Social History Social History   Tobacco Use   Smoking status: Never Smoker   Smokeless tobacco: Never Used  Substance Use Topics   Alcohol use: No    Frequency: Never   Drug use: No     Allergies   Patient has no known allergies.   Review of Systems Review of Systems  Eyes: Positive for discharge and redness. Negative for blurred vision, double vision, photophobia and itching.  Neurological: Negative for headaches.     Physical Exam Triage Vital Signs ED Triage Vitals  Enc Vitals Group     BP 06/17/19 0953 104/72     Pulse Rate 06/17/19 0953 73     Resp --      Temp 06/17/19 0953 98.8 F (37.1 C)     Temp Source 06/17/19 0953 Oral     SpO2 06/17/19 0953 96 %     Weight 06/17/19 0955 225 lb (102.1 kg)     Height 06/17/19 0955 5\' 6"  (1.676 m)     Head Circumference --  Peak Flow --      Pain Score 06/17/19 0954 1     Pain Loc --      Pain Edu? --      Excl. in [place]? --    No data found.  Updated Vital Signs BP 104/72 (BP Location: Right Arm)    Pulse 73    Temp 98.8 F (37.1 C) (Oral)    Ht 5\' 6"  (1.676 m)    Wt 102.1 kg    SpO2 96%    BMI 36.32 kg/m   Visual Acuity Right Eye Distance: 20/25 Left Eye Distance: 20/25 Bilateral Distance: 20/25  Right Eye Near:   Left Eye Near:    Bilateral Near:     Physical Exam Nursing notes and Vital Signs reviewed. Appearance:  Patient appears stated age, and in no acute distress Eyes:  Pupils are equal, round, and reactive to light and accomodation.  Extraocular movement is intact.  Left conjunctivae mildly injected.  No lid swelling or tenderness  to palpation.  Left lid eversion reveals no foreign bodies.  Fluorescein shows no uptake left cornea.  No photophobia.  Right eye appears normal. Ears:  Canals normal.  Tympanic membranes normal.  Nose:  Mildly congested turbinates.  No sinus tenderness.  Neck:  Supple. No adnopathy. Lungs:  Clear to auscultation.  Breath sounds are equal.  Moving air well. Heart:  Regular rate and rhythm without murmurs, rubs, or gallops.  Extremities:  No edema.  Skin:  No rash present.    UC Treatments / Results  Labs (all labs ordered are listed, but only abnormal results are displayed) Labs Reviewed - No data to display  EKG   Radiology No results found.  Procedures Procedures (including critical care time)  Medications Ordered in UC Medications - No data to display  Initial Impression / Assessment and Plan / UC Course  I have reviewed the triage vital signs and the nursing notes.  Pertinent labs & imaging results that were available during my care of the patient were reviewed by me and considered in my medical decision making (see chart for details).    Suspect viral conjunctivitis; likely early viral URI Begin empiric sulfacetamide ophthalmic suspension. Advise to avoid wearing contacts until symptoms resolved. Followup with ophthalmologist if not improved 4 days.     Final Clinical Impressions(s) / UC Diagnoses   Final diagnoses:  Acute conjunctivitis of left eye, unspecified acute conjunctivitis type     Discharge Instructions     May continue to use lubricating eye drops as needed.    ED Prescriptions    Medication Sig Dispense Auth. Provider   sulfacetamide (BLEPH-10) 10 % ophthalmic solution Place 1-2 drops into both eyes every 4 (four) hours. (While awake) 5 mL Lattie HawBeese, Zamira Hickam A, MD        Lattie HawBeese, Nhat Hearne A, MD 06/20/19 [phone]

## 2019-06-17 NOTE — Discharge Instructions (Addendum)
May continue to use lubricating eye drops as needed.

## 2019-07-13 ENCOUNTER — Other Ambulatory Visit: Payer: Self-pay

## 2019-07-13 ENCOUNTER — Emergency Department
Admission: EM | Admit: 2019-07-13 | Discharge: 2019-07-13 | Disposition: A | Discharge status: Home / Self Care | Payer: Managed Care, Other (non HMO) | Source: Home / Self Care

## 2019-07-13 ENCOUNTER — Encounter: Payer: Self-pay | Admitting: Emergency Medicine

## 2019-07-13 DIAGNOSIS — H9202 Otalgia, left ear: Secondary | ICD-10-CM

## 2019-07-13 DIAGNOSIS — R110 Nausea: Secondary | ICD-10-CM

## 2019-07-13 DIAGNOSIS — R6889 Other general symptoms and signs: Secondary | ICD-10-CM | POA: Diagnosis not present

## 2019-07-13 DIAGNOSIS — R42 Dizziness and giddiness: Secondary | ICD-10-CM

## 2019-07-13 LAB — POCT INFLUENZA A/B
Influenza A, POC: NEGATIVE
Influenza B, POC: NEGATIVE

## 2019-07-13 MED ORDER — ONDANSETRON 4 MG PO TBDP
4.0000 mg | ORAL_TABLET | Freq: Once | ORAL | Status: AC
  Administered 2019-07-13: 11:00:00 4 mg via ORAL

## 2019-07-13 MED ORDER — ONDANSETRON HCL 4 MG PO TABS: 4.0000 mg | ORAL_TABLET | Freq: Four times a day (QID) | ORAL | 0 refills | Status: DC

## 2019-07-13 NOTE — ED Triage Notes (Signed)
Took tylenol at 9am States fever has been 101

## 2019-07-13 NOTE — ED Triage Notes (Signed)
Since Thursday nausea, fever and dizziness and left ear fullness.

## 2019-07-13 NOTE — ED Provider Notes (Signed)
[name]    CSN: [id number] Arrival date & time: 07/13/19  1018      History   Chief Complaint Chief Complaint  Patient presents with  . Fever    HPI Amber Cruz is a 63 y.o. female.   HPI Amber Cruz is a 63 y.o. female presenting to UC with c/o gradually worsening flu-like symptoms the last 2-3 days. She returned from the beach 3 days ago.  She started feeling nauseated on the ride home, the next day she developed mild body aches, congestion, dizziness, and Left ear fullness. She had an ear tube placed in Left ear about 1 year ago. Denies bleeding or drainage from her ear. She has taken Tylenol and OTC cold/sinus medication with mild relief. She has also taken zofran, which has helped the nausea but she took her last pill today.  No known sick contacts. Pt stayed with her husband in their camper trailer while at the beach. Her husband is not sick. 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  Denies chest pain, SOB, or major cough. She has an occasional dry cough to clear her throat.   History reviewed. No pertinent past medical history.  There are no active problems to display for this patient.   History reviewed. No pertinent surgical history.  OB History   No obstetric history on file.      Home Medications    Prior to Admission medications   Medication Sig Start Date End Date Taking? Authorizing Provider  aspirin EC 81 MG tablet Take 81 mg by mouth daily.    [provider]  levothyroxine (SYNTHROID, LEVOTHROID) 137 MCG tablet Take 137 mcg by mouth daily before breakfast.    [provider]  sertraline (ZOLOFT) 100 MG tablet Take 100 mg by mouth daily.    [provider]  sulfacetamide (BLEPH-10) 10 % ophthalmic solution Place 1-2 drops into both eyes every 4 (four) hours. (While awake) 06/17/19   Lattie Haw, MD    Family History Family History  Problem  Relation Age of Onset  . Alzheimer's disease Mother   . Hypertension Father   . Heart failure Father     Social History Social History   Tobacco Use  . Smoking status: Never Smoker  . Smokeless tobacco: Never Used  Substance Use Topics  . Alcohol use: No    Frequency: Never  . Drug use: No     Allergies   Iodides   Review of Systems Review of Systems  Constitutional: Negative for chills and fever.  HENT: Positive for congestion and ear pain (Left). Negative for ear discharge, sore throat, trouble swallowing and voice change.   Respiratory: Positive for cough (minimal). Negative for shortness of breath.   Cardiovascular: Negative for chest pain and palpitations.  Gastrointestinal: Negative for abdominal pain, diarrhea, nausea and vomiting.  Musculoskeletal: Negative for arthralgias, back pain and myalgias.  Skin: Negative for rash.  Neurological: Positive for dizziness and headaches. Negative for light-headedness.     Physical Exam Triage Vital Signs ED Triage Vitals  Enc Vitals Group     BP 07/13/19 1033 129/64     Pulse Rate 07/13/19 1033 79     Resp --      Temp 07/13/19 1033 98.7 F (37.1 C)     Temp Source 07/13/19 1033 Oral     SpO2 07/13/19 1033 96 %     Weight --  Height --      Head Circumference --      Peak Flow --      Pain Score 07/13/19 1035 0     Pain Loc --      Pain Edu? --      Excl. in [place]? --    No data found.  Updated Vital Signs BP 129/64 (BP Location: Right Arm)   Pulse 79   Temp 98.7 F (37.1 C) (Oral)   SpO2 96%   Visual Acuity Right Eye Distance:   Left Eye Distance:   Bilateral Distance:    Right Eye Near:   Left Eye Near:    Bilateral Near:     Physical Exam Vitals signs and nursing note reviewed.  Constitutional:      Appearance: Normal appearance. She is well-developed.  HENT:     Head: Normocephalic and atraumatic.     Right Ear: Tympanic membrane and ear canal normal.     Left Ear: Ear canal normal. No  drainage, swelling or tenderness. A PE tube (in place, clear, no bleeding or drainage ) is present. Tympanic membrane is not erythematous or bulging.     Nose: Nose normal.     Right Sinus: No maxillary sinus tenderness or frontal sinus tenderness.     Left Sinus: No maxillary sinus tenderness or frontal sinus tenderness.     Mouth/Throat:     Lips: Pink.     Mouth: Mucous membranes are moist.     Pharynx: Oropharynx is clear. Uvula midline.  Neck:     Musculoskeletal: Normal range of motion.  Cardiovascular:     Rate and Rhythm: Normal rate and regular rhythm.  Pulmonary:     Effort: Pulmonary effort is normal. No respiratory distress.     Breath sounds: Normal breath sounds. No stridor. No wheezing, rhonchi or rales.  Musculoskeletal: Normal range of motion.  Skin:    General: Skin is warm and dry.  Neurological:     Mental Status: She is alert and oriented to person, place, and time.  Psychiatric:        Behavior: Behavior normal.      UC Treatments / Results  Labs (all labs ordered are listed, but only abnormal results are displayed) Labs Reviewed  NOVEL CORONAVIRUS, NAA  POCT INFLUENZA A/B    EKG   Radiology No results found.  Procedures Procedures (including critical care time)  Medications Ordered in UC Medications  ondansetron (ZOFRAN-ODT) disintegrating tablet 4 mg (4 mg Oral Given 07/13/19 1107)    Initial Impression / Assessment and Plan / UC Course  I have reviewed the triage vital signs and the nursing notes.  Pertinent labs & imaging results that were available during my care of the patient were reviewed by me and considered in my medical decision making (see chart for details).     Rapid flu: NEGATIVE Covid-19 pending Encouraged symptomatic tx AVS provided  Final Clinical Impressions(s) / UC Diagnoses   Final diagnoses:  Flu-like symptoms   Discharge Instructions   None    ED Prescriptions    None     PDMP not reviewed this  encounter.   Noe Gens, [place] 07/13/19 1123

## 2019-07-13 NOTE — Discharge Instructions (Signed)
  You may take 500mg acetaminophen every 4-6 hours or in combination with ibuprofen 400-600mg every 6-8 hours as needed for pain, inflammation, and fever.  Be sure to well hydrated with clear liquids and get at least 8 hours of sleep at night, preferably more while sick.   Please follow up with family medicine in 1 week if needed.   

## 2019-07-14 ENCOUNTER — Telehealth: Payer: Self-pay

## 2019-07-14 MED ORDER — PREDNISONE 50 MG PO TABS: 50.0000 mg | ORAL_TABLET | Freq: Every day | ORAL | 0 refills | Status: AC

## 2019-07-14 NOTE — Telephone Encounter (Signed)
Prednisone sent to pharmacy on file to help with sinus pressure. F/u with PCP or ENT if not improving by later this week.

## 2019-07-14 NOTE — Telephone Encounter (Signed)
Pt called and said that the pressure in the left ear is worse. Wanted to know what could be done.

## 2019-07-15 LAB — NOVEL CORONAVIRUS, NAA: SARS-CoV-2, NAA: NOT DETECTED

## 2020-01-09 ENCOUNTER — Emergency Department (HOSPITAL_BASED_OUTPATIENT_CLINIC_OR_DEPARTMENT_OTHER): Payer: Managed Care, Other (non HMO)

## 2020-01-09 ENCOUNTER — Emergency Department (HOSPITAL_BASED_OUTPATIENT_CLINIC_OR_DEPARTMENT_OTHER)
Admission: EM | Admit: 2020-01-09 | Discharge: 2020-01-10 | Disposition: A | Discharge status: Home / Self Care | Payer: Managed Care, Other (non HMO) | Attending: Emergency Medicine | Admitting: Emergency Medicine

## 2020-01-09 ENCOUNTER — Other Ambulatory Visit: Payer: Self-pay

## 2020-01-09 ENCOUNTER — Encounter (HOSPITAL_BASED_OUTPATIENT_CLINIC_OR_DEPARTMENT_OTHER): Payer: Self-pay | Admitting: *Deleted

## 2020-01-09 DIAGNOSIS — S81012A Laceration without foreign body, left knee, initial encounter: Secondary | ICD-10-CM

## 2020-01-09 DIAGNOSIS — W19XXXA Unspecified fall, initial encounter: Secondary | ICD-10-CM

## 2020-01-09 DIAGNOSIS — Y999 Unspecified external cause status: Secondary | ICD-10-CM | POA: Insufficient documentation

## 2020-01-09 DIAGNOSIS — W01198A Fall on same level from slipping, tripping and stumbling with subsequent striking against other object, initial encounter: Secondary | ICD-10-CM | POA: Insufficient documentation

## 2020-01-09 DIAGNOSIS — Y92007 Garden or yard of unspecified non-institutional (private) residence as the place of occurrence of the external cause: Secondary | ICD-10-CM | POA: Insufficient documentation

## 2020-01-09 DIAGNOSIS — Y9389 Activity, other specified: Secondary | ICD-10-CM | POA: Insufficient documentation

## 2020-01-09 DIAGNOSIS — Z23 Encounter for immunization: Secondary | ICD-10-CM | POA: Insufficient documentation

## 2020-01-09 DIAGNOSIS — Z79899 Other long term (current) drug therapy: Secondary | ICD-10-CM | POA: Diagnosis not present

## 2020-01-09 DIAGNOSIS — Z7982 Long term (current) use of aspirin: Secondary | ICD-10-CM | POA: Insufficient documentation

## 2020-01-09 MED ORDER — TETANUS-DIPHTH-ACELL PERTUSSIS 5-2.5-18.5 LF-MCG/0.5 IM SUSP
0.5000 mL | Freq: Once | INTRAMUSCULAR | Status: AC
  Administered 2020-01-09: 23:00:00 0.5 mL via INTRAMUSCULAR
  Filled 2020-01-09: qty 0.5

## 2020-01-09 MED ORDER — LIDOCAINE-EPINEPHRINE (PF) 2 %-1:200000 IJ SOLN
10.0000 mL | Freq: Once | INTRAMUSCULAR | Status: AC
  Administered 2020-01-09: 10 mL
  Filled 2020-01-09: qty 10

## 2020-01-09 NOTE — ED Triage Notes (Signed)
She slipped in her yard and fell today. Laceration to her left knee. Skin tears to her hands and right forearm. Stiffness to both shoulders.

## 2020-01-10 MED ORDER — TRAMADOL HCL 50 MG PO TABS: 50.0000 mg | ORAL_TABLET | Freq: Four times a day (QID) | ORAL | 0 refills | Status: DC | PRN

## 2020-01-10 NOTE — ED Provider Notes (Signed)
[facility] Provider Note   CSN: [id number] Arrival date & time: 01/09/20  1940     History Chief Complaint  Patient presents with  . Fall    Amber Cruz is a 64 y.o. female.  Patient status post a fall outside in the garden.  Feels a tetanus is probably not up-to-date.  She did not hit her head there was no loss of consciousness.  Main injury has been laceration to her left knee.  Which is L-shaped.  Also has a few abrasions superficial to her arms and hands.  But no complaints of any pain there.  Patient states she did roll.        History reviewed. No pertinent past medical history.  There are no problems to display for this patient.   History reviewed. No pertinent surgical history.   OB History   No obstetric history on file.     Family History  Problem Relation Age of Onset  . Alzheimer's disease Mother   . Hypertension Father   . Heart failure Father     Social History   Tobacco Use  . Smoking status: Never Smoker  . Smokeless tobacco: Never Used  Substance Use Topics  . Alcohol use: No  . Drug use: No    Home Medications Prior to Admission medications   Medication Sig Start Date End Date Taking? Authorizing Provider  aspirin EC 81 MG tablet Take 81 mg by mouth daily.   Yes [provider]  levothyroxine (SYNTHROID, LEVOTHROID) 137 MCG tablet Take 137 mcg by mouth daily before breakfast.   Yes [provider]  sertraline (ZOLOFT) 100 MG tablet Take 100 mg by mouth daily.   Yes [provider]  ondansetron (ZOFRAN) 4 MG tablet Take 1 tablet (4 mg total) by mouth every 6 (six) hours. 07/13/19   Noe Gens, PA-C  sulfacetamide (BLEPH-10) 10 % ophthalmic solution Place 1-2 drops into both eyes every 4 (four) hours. (While awake) 06/17/19   Kandra Nicolas, MD  traMADol (ULTRAM) 50 MG tablet Take 1 tablet (50 mg total) by mouth every 6 (six) hours as needed. 01/10/20   Fredia Sorrow, MD     Allergies    Iodides  Review of Systems   Review of Systems  Constitutional: Negative for chills and fever.  HENT: Negative for rhinorrhea and sore throat.   Eyes: Negative for visual disturbance.  Respiratory: Negative for cough and shortness of breath.   Cardiovascular: Negative for chest pain and leg swelling.  Gastrointestinal: Negative for abdominal pain, diarrhea, nausea and vomiting.  Genitourinary: Negative for dysuria.  Musculoskeletal: Negative for back pain and neck pain.  Skin: Positive for wound. Negative for rash.  Neurological: Negative for dizziness, light-headedness and headaches.  Hematological: Does not bruise/bleed easily.  Psychiatric/Behavioral: Negative for confusion.    Physical Exam Updated Vital Signs BP 116/83   Pulse 66   Temp 98.1 F (36.7 C) (Oral)   Resp 20   Ht 1.676 m (5\' 6" )   Wt 102.1 kg   SpO2 100%   BMI 36.33 kg/m   Physical Exam Vitals and nursing note reviewed.  Constitutional:      General: She is not in acute distress.    Appearance: She is well-developed.  HENT:     Head: Normocephalic and atraumatic.  Eyes:     Conjunctiva/sclera: Conjunctivae normal.     Pupils: Pupils are equal, round, and reactive to light.  Cardiovascular:     Rate  and Rhythm: Normal rate and regular rhythm.     Heart sounds: No murmur.  Pulmonary:     Effort: Pulmonary effort is normal. No respiratory distress.     Breath sounds: Normal breath sounds.  Abdominal:     Palpations: Abdomen is soft.     Tenderness: There is no abdominal tenderness.  Musculoskeletal:        General: Signs of injury present.     Cervical back: Normal range of motion and neck supple.     Comments: Some superficial abrasions skin tears to hands and forearm.  Scattered.  None of them deep.  Radial pulses are 2+.  Left knee has an L-shaped laceration into the subcutaneous tissue that measures 7 cm in total.  You can see some of the underlying structures.  No evidence of  any significant foreign bodies.  No evidence of joint capsule penetration.  Distally dorsalis pedis and posterior tibial pulses are 1-2+.  Good cap refill.  Sensation to the foot is fine.  Motor function is fine.  Skin:    General: Skin is warm and dry.  Neurological:     General: No focal deficit present.     Mental Status: She is alert and oriented to person, place, and time.     Cranial Nerves: No cranial nerve deficit.     Sensory: No sensory deficit.     Motor: No weakness.     ED Results / Procedures / Treatments   Labs (all labs ordered are listed, but only abnormal results are displayed) Labs Reviewed - No data to display  EKG None  Radiology DG Knee Complete 4 Views Left  Result Date: 01/09/2020 CLINICAL DATA:  Recent fall with left knee pain, initial encounter EXAM: LEFT KNEE - COMPLETE 4+ VIEW COMPARISON:  None. FINDINGS: Soft tissue injury is noted anteriorly consistent with the given clinical history. No acute fracture or dislocation is noted. No other focal abnormality is noted. IMPRESSION: Soft tissue injury anteriorly without bony abnormality. Electronically Signed   By: Alcide Clever M.D.   On: 01/09/2020 20:26    Procedures Procedures (including critical care time)  Medications Ordered in ED Medications  Tdap (BOOSTRIX) injection 0.5 mL (0.5 mLs Intramuscular Given 01/09/20 2243)  lidocaine-EPINEPHrine (XYLOCAINE W/EPI) 2 %-1:200000 (PF) injection 10 mL (10 mLs Infiltration Given 01/09/20 2244)    ED Course  I have reviewed the triage vital signs and the nursing notes.  Pertinent labs & imaging results that were available during my care of the patient were reviewed by me and considered in my medical decision making (see chart for details).    MDM Rules/Calculators/A&P                      X-rays of the left knee show no bony involvement.  No evidence of air in the joint capsule.  No evidence of any foreign bodies.  Wound irrigation and suture closure done  by physician assistant.  Shared visit.  Wound will be dressed.  Patient be placed in knee immobilizer.  Suture removal in 10 to 14 days.  Patient tetanus was updated here.  Medical screening examination/treatment/procedure(s) were conducted as a shared visit with non-physician practitioner(s) and myself.  I personally evaluated the patient during the encounter.     Final Clinical Impression(s) / ED Diagnoses Final diagnoses:  Fall, initial encounter  Knee laceration, left, initial encounter    Rx / DC Orders ED Discharge Orders  Ordered    traMADol (ULTRAM) 50 MG tablet  Every 6 hours PRN     01/10/20 0011           Vanetta Mulders, MD 01/10/20 0020

## 2020-01-10 NOTE — ED Provider Notes (Signed)
LACERATION REPAIR Performed by: Placido Sou Consent: Verbal consent obtained. Risks and benefits: risks, benefits and alternatives were discussed Patient identity confirmed: provided demographic data Time out performed prior to procedure Prepped and Draped in normal sterile fashion Wound explored Laceration Location: left knee Laceration Length: 7 cm No Foreign Bodies seen or palpated Anesthesia: local infiltration Local anesthetic: lidocaine 2% with epinephrine Anesthetic total: 5 ml Irrigation method: syringe Amount of cleaning: standard Skin closure: sutures Number of sutures or staples: 15 Technique: simple interrupted  Patient tolerance: Patient tolerated the procedure well with no immediate complications.    Placido Sou, PA-C 01/10/20 0007    Vanetta Mulders, MD 01/10/20 [phone]

## 2020-01-10 NOTE — Discharge Instructions (Addendum)
Keep knee immobilizer in place. Except for bathing. But do not bend. The sutures may rip. Suture removal and 14 days. He can return here to have them removed. Tetanus updated here. Keep dressing in place for 2 days. Then can take off and remove and apply a new dressing. He can also have sutures removed by your primary care doctor. Prescription for tramadol provided for pain.

## 2020-03-22 ENCOUNTER — Other Ambulatory Visit: Payer: Self-pay

## 2020-03-22 ENCOUNTER — Emergency Department (INDEPENDENT_AMBULATORY_CARE_PROVIDER_SITE_OTHER)
Admission: RE | Admit: 2020-03-22 | Discharge: 2020-03-22 | Disposition: A | Discharge status: Home / Self Care | Payer: Managed Care, Other (non HMO) | Source: Ambulatory Visit

## 2020-03-22 VITALS — BP 109/73 | HR 68 | Temp 99.2°F | Ht 66.0 in | Wt 240.0 lb

## 2020-03-22 DIAGNOSIS — H6692 Otitis media, unspecified, left ear: Secondary | ICD-10-CM

## 2020-03-22 DIAGNOSIS — H8112 Benign paroxysmal vertigo, left ear: Secondary | ICD-10-CM

## 2020-03-22 MED ORDER — MECLIZINE HCL 25 MG PO TABS: 25.0000 mg | ORAL_TABLET | Freq: Three times a day (TID) | ORAL | 0 refills | Status: DC | PRN

## 2020-03-22 MED ORDER — PREDNISONE 20 MG PO TABS: ORAL_TABLET | ORAL | 0 refills | Status: DC

## 2020-03-22 MED ORDER — AMOXICILLIN-POT CLAVULANATE 875-125 MG PO TABS: 1.0000 | ORAL_TABLET | Freq: Two times a day (BID) | ORAL | 0 refills | Status: DC

## 2020-03-22 NOTE — ED Provider Notes (Signed)
[facility]    CSN: [id number] Arrival date & time: 03/22/20  1305      History   Chief Complaint Chief Complaint  Patient presents with  . Otalgia    HPI Amber Cruz is a 64 y.o. female.   HPI Amber Cruz is a 64 y.o. female presenting to UC with c/o left ear pain that is aching and throbbing, associated dizziness and mild nausea c/w prior episodes of vertigo that only occur when moving her head such as looking down.  Facial pain on Left side when leaning forward, sinus drainage.  Symptoms started 3 days ago, gradually worsening. Ear tube placed Left ear 68mo ago.  No recent swimming. No fever, chills. No sick contacts or recent travel.    History reviewed. No pertinent past medical history.  There are no problems to display for this patient.   History reviewed. No pertinent surgical history.  OB History   No obstetric history on file.      Home Medications    Prior to Admission medications   Medication Sig Start Date End Date Taking? Authorizing Provider  levothyroxine (SYNTHROID, LEVOTHROID) 137 MCG tablet Take 137 mcg by mouth daily before breakfast.   Yes [provider]  LORazepam (ATIVAN) 1 MG tablet TAKE ONE (1) TABLET BY MOUTH TWICE DAILY DAY AS NEEDED FOR ANXIETY 11/17/16  Yes [provider]  Multiple Vitamin (MULTI-VITAMIN) tablet Take 1 tablet by mouth daily.   Yes [provider]  sertraline (ZOLOFT) 100 MG tablet Take 100 mg by mouth daily.   Yes [provider]  amoxicillin-clavulanate (AUGMENTIN) 875-125 MG tablet Take 1 tablet by mouth 2 (two) times daily. One po bid x 7 days 03/22/20   Noe Gens, PA-C  aspirin EC 81 MG tablet Take 81 mg by mouth daily.    [provider]  meclizine (ANTIVERT) 25 MG tablet Take 1 tablet (25 mg total) by mouth 3 (three) times daily as needed for dizziness. 03/22/20   Noe Gens, PA-C  ondansetron (ZOFRAN) 4 MG tablet Take 1 tablet (4 mg total) by mouth  every 6 (six) hours. 07/13/19   Noe Gens, PA-C  predniSONE (DELTASONE) 20 MG tablet 3 tabs po day one, then 2 po daily x 4 days 03/22/20   Noe Gens, PA-C  sulfacetamide (BLEPH-10) 10 % ophthalmic solution Place 1-2 drops into both eyes every 4 (four) hours. (While awake) 06/17/19   Kandra Nicolas, MD  traMADol (ULTRAM) 50 MG tablet Take 1 tablet (50 mg total) by mouth every 6 (six) hours as needed. 01/10/20   Fredia Sorrow, MD    Family History Family History  Problem Relation Age of Onset  . Alzheimer's disease Mother   . Hypertension Father   . Heart failure Father     Social History Social History   Tobacco Use  . Smoking status: Never Smoker  . Smokeless tobacco: Never Used  Vaping Use  . Vaping Use: Never used  Substance Use Topics  . Alcohol use: No  . Drug use: No     Allergies   Iodides   Review of Systems Review of Systems  Constitutional: Negative for chills and fever.  HENT: Positive for congestion and ear pain (Left ). Negative for ear discharge, sore throat, trouble swallowing and voice change.   Respiratory: Positive for cough (minimal from drainage). Negative for shortness of breath.   Cardiovascular: Negative for chest pain and palpitations.  Gastrointestinal: Positive for nausea. Negative for  abdominal pain, diarrhea and vomiting.  Musculoskeletal: Negative for arthralgias, back pain and myalgias.  Skin: Negative for rash.  Neurological: Positive for dizziness. Negative for light-headedness and headaches.  All other systems reviewed and are negative.    Physical Exam Triage Vital Signs ED Triage Vitals  Enc Vitals Group     BP 03/22/20 1327 109/73     Pulse Rate 03/22/20 1327 68     Resp --      Temp 03/22/20 1327 99.2 F (37.3 C)     Temp Source 03/22/20 1327 Oral     SpO2 03/22/20 1327 95 %     Weight 03/22/20 1322 240 lb (108.9 kg)     Height 03/22/20 1322 5\' 6"  (1.676 m)     Head Circumference --      Peak Flow --       Pain Score 03/22/20 1322 6     Pain Loc --      Pain Edu? --      Excl. in GC? --    No data found.  Updated Vital Signs BP 109/73 (BP Location: Right Arm)   Pulse 68   Temp 99.2 F (37.3 C) (Oral)   Ht 5\' 6"  (1.676 m)   Wt 240 lb (108.9 kg)   SpO2 95%   BMI 38.74 kg/m   Visual Acuity Right Eye Distance:   Left Eye Distance:   Bilateral Distance:    Right Eye Near:   Left Eye Near:    Bilateral Near:     Physical Exam Vitals and nursing note reviewed.  Constitutional:      Appearance: Normal appearance. She is well-developed.  HENT:     Head: Normocephalic and atraumatic.     Right Ear: Tympanic membrane and ear canal normal.     Left Ear: Tenderness present. No drainage or swelling. A PE tube is present. Tympanic membrane is erythematous. Tympanic membrane is not bulging.     Nose:     Right Sinus: No maxillary sinus tenderness or frontal sinus tenderness.     Left Sinus: Maxillary sinus tenderness present. No frontal sinus tenderness.     Mouth/Throat:     Lips: Pink.     Mouth: Mucous membranes are moist.     Pharynx: Oropharynx is clear. Uvula midline.  Eyes:     Extraocular Movements: Extraocular movements intact.     Pupils: Pupils are equal, round, and reactive to light.  Cardiovascular:     Rate and Rhythm: Normal rate and regular rhythm.  Pulmonary:     Effort: Pulmonary effort is normal. No respiratory distress.     Breath sounds: Normal breath sounds. No stridor. No wheezing, rhonchi or rales.  Musculoskeletal:        General: Normal range of motion.     Cervical back: Normal range of motion.  Skin:    General: Skin is warm and dry.  Neurological:     General: No focal deficit present.     Mental Status: She is alert and oriented to person, place, and time.     Cranial Nerves: No cranial nerve deficit.     Coordination: Coordination normal.     Gait: Gait normal.  Psychiatric:        Mood and Affect: Mood normal.        Behavior: Behavior  normal.      UC Treatments / Results  Labs (all labs ordered are listed, but only abnormal results are displayed) Labs Reviewed - No  data to display  EKG   Radiology No results found.  Procedures Procedures (including critical care time)  Medications Ordered in UC Medications - No data to display  Initial Impression / Assessment and Plan / UC Course  I have reviewed the triage vital signs and the nursing notes.  Pertinent labs & imaging results that were available during my care of the patient were reviewed by me and considered in my medical decision making (see chart for details).     Will tx for otitis media, and secondary vertigo F/u PCP  AVS given  Final Clinical Impressions(s) / UC Diagnoses   Final diagnoses:  Acute left otitis media  Benign paroxysmal positional vertigo of left ear     Discharge Instructions      You may take 500mg  acetaminophen every 4-6 hours or in combination with ibuprofen 400-600mg  every 6-8 hours as needed for pain, inflammation, and fever.  Be sure to well hydrated with clear liquids and get at least 8 hours of sleep at night, preferably more while sick.   Please follow up with family medicine in 1 week if needed.  Antivert (meclizine) is a medication to help with dizziness and nausea related to vertigo.  This medication can cause drowsiness. Do not operate heavy machinery or drive while taking.   Please take antibiotics as prescribed and be sure to complete entire course even if you start to feel better to ensure infection does not come back.      ED Prescriptions    Medication Sig Dispense Auth. Provider   amoxicillin-clavulanate (AUGMENTIN) 875-125 MG tablet Take 1 tablet by mouth 2 (two) times daily. One po bid x 7 days 14 tablet Akeria Hedstrom O, PA-C   predniSONE (DELTASONE) 20 MG tablet 3 tabs po day one, then 2 po daily x 4 days 11 tablet Cabe Lashley O, PA-C   meclizine (ANTIVERT) 25 MG tablet Take 1 tablet (25 mg  total) by mouth 3 (three) times daily as needed for dizziness. 30 tablet 12-23-1971, Lurene Shadow     PDMP not reviewed this encounter.   [place], Lurene Shadow 03/22/20 1431

## 2020-03-22 NOTE — ED Triage Notes (Signed)
Pt states she has some left ear pain, pt states that she has some facial pain as well and dizziness. Pt states that she has some sinus drainage as well. Pt states that I started 3 days ago. Pt states that she has been fully vaccinated.

## 2020-03-22 NOTE — Discharge Instructions (Signed)
  You may take 500mg  acetaminophen every 4-6 hours or in combination with ibuprofen 400-600mg  every 6-8 hours as needed for pain, inflammation, and fever.  Be sure to well hydrated with clear liquids and get at least 8 hours of sleep at night, preferably more while sick.   Please follow up with family medicine in 1 week if needed.  Antivert (meclizine) is a medication to help with dizziness and nausea related to vertigo.  This medication can cause drowsiness. Do not operate heavy machinery or drive while taking.   Please take antibiotics as prescribed and be sure to complete entire course even if you start to feel better to ensure infection does not come back.

## 2020-08-26 ENCOUNTER — Emergency Department
Admission: RE | Admit: 2020-08-26 | Discharge: 2020-08-26 | Disposition: A | Discharge status: Home / Self Care | Payer: Managed Care, Other (non HMO) | Source: Ambulatory Visit

## 2020-08-26 ENCOUNTER — Other Ambulatory Visit: Payer: Self-pay

## 2020-08-26 VITALS — BP 127/78 | HR 64 | Temp 98.7°F | Resp 18 | Ht 66.0 in | Wt 235.0 lb

## 2020-08-26 DIAGNOSIS — S0081XA Abrasion of other part of head, initial encounter: Secondary | ICD-10-CM | POA: Diagnosis not present

## 2020-08-26 HISTORY — DX: Disorder of thyroid, unspecified: E07.9

## 2020-08-26 HISTORY — DX: Aneurysm of unspecified site: I72.9

## 2020-08-26 HISTORY — DX: Anxiety disorder, unspecified: F41.9

## 2020-08-26 NOTE — ED Provider Notes (Signed)
[name]    CSN: [id number] Arrival date & time: 08/26/20  1049      History   Chief Complaint Chief Complaint  Patient presents with  . Abrasion    HPI Amber Cruz is a 64 y.o. female.   The history is provided by the patient. No language interpreter was used.  Facial Injury Mechanism of injury:  Laceration Location:  Face Pain details:    Quality:  Aching   Severity:  Mild Foreign body present:  No foreign bodies Relieved by:  Nothing Worsened by:  Nothing Pt complains of scratches on her face from her dog.   Past Medical History:  Diagnosis Date  . Aneurysm (HCC)   . Anxiety   . Thyroid disease     There are no problems to display for this patient.   Past Surgical History:  Procedure Laterality Date  . DESCENDING AORTIC ANEURYSM REPAIR W/ STENT      OB History   No obstetric history on file.      Home Medications    Prior to Admission medications   Medication Sig Start Date End Date Taking? Authorizing Provider  DULoxetine (CYMBALTA) 30 MG capsule Take 1 a day 07/31/20  Yes [provider]  levothyroxine (SYNTHROID, LEVOTHROID) 137 MCG tablet Take 137 mcg by mouth daily before breakfast.   Yes [provider]  LORazepam (ATIVAN) 1 MG tablet TAKE ONE (1) TABLET BY MOUTH TWICE DAILY DAY AS NEEDED FOR ANXIETY 11/17/16  Yes [provider]  meclizine (ANTIVERT) 25 MG tablet Take 1 tablet (25 mg total) by mouth 3 (three) times daily as needed for dizziness. 03/22/20  Yes Phelps, Vangie Bicker, PA-C  Multiple Vitamin (MULTI-VITAMIN) tablet Take 1 tablet by mouth daily.   Yes [provider]  OZEMPIC, 0.25 OR 0.5 MG/DOSE, 2 MG/1.5ML SOPN SMARTSIG:0.4 Milliliter(s) SUB-Q Once a Week 08/05/20   [provider]    Family History Family History  Problem Relation Age of Onset  . Alzheimer's disease Mother   . Hypertension Father   . Heart failure Father     Social History Social History   Tobacco  Use  . Smoking status: Never Smoker  . Smokeless tobacco: Never Used  Vaping Use  . Vaping Use: Never used  Substance Use Topics  . Alcohol use: No  . Drug use: No     Allergies   Iodinated diagnostic agents, Ambien [zolpidem], Honey bee venom protein [honey bee venom], and Iodides   Review of Systems Review of Systems  All other systems reviewed and are negative.    Physical Exam Triage Vital Signs ED Triage Vitals  Enc Vitals Group     BP 08/26/20 1119 127/78     Pulse Rate 08/26/20 1119 64     Resp 08/26/20 1119 18     Temp 08/26/20 1119 98.7 F (37.1 C)     Temp Source 08/26/20 1119 Oral     SpO2 08/26/20 1119 96 %     Weight 08/26/20 1109 235 lb (106.6 kg)     Height 08/26/20 1109 5\' 6"  (1.676 m)     Head Circumference --      Peak Flow --      Pain Score 08/26/20 1108 1     Pain Loc --      Pain Edu? --      Excl. in GC? --    No data found.  Updated Vital Signs BP 127/78 (BP Location: Right Arm)   Pulse  64   Temp 98.7 F (37.1 C) (Oral)   Resp 18   Ht 5\' 6"  (1.676 m)   Wt 106.6 kg   SpO2 96%   BMI 37.93 kg/m   Visual Acuity Right Eye Distance:   Left Eye Distance:   Bilateral Distance:    Right Eye Near:   Left Eye Near:    Bilateral Near:     Physical Exam Vitals and nursing note reviewed.  Constitutional:      Appearance: She is well-developed.  HENT:     Head: Normocephalic.     Comments: 2 abrasions face, no gapping    Nose: Nose normal.     Mouth/Throat:     Mouth: Mucous membranes are moist.  Musculoskeletal:        General: Normal range of motion.     Cervical back: Normal range of motion.  Neurological:     General: No focal deficit present.     Mental Status: She is alert and oriented to person, place, and time.  Psychiatric:        Mood and Affect: Mood normal.      UC Treatments / Results  Labs (all labs ordered are listed, but only abnormal results are displayed) Labs Reviewed - No data to  display  EKG   Radiology No results found.  Procedures Procedures (including critical care time)  Medications Ordered in UC Medications - No data to display  Initial Impression / Assessment and Plan / UC Course  I have reviewed the triage vital signs and the nursing notes.  Pertinent labs & imaging results that were available during my care of the patient were reviewed by me and considered in my medical decision making (see chart for details).     MDM:  Pt's tetanus is up to date.  Pt has Bactroban ointment.  Pt advised to use ointment  Final Clinical Impressions(s) / UC Diagnoses   Final diagnoses:  Abrasion of face, initial encounter     Discharge Instructions     Use antibiotic ointment 2 times a day   ED Prescriptions    None    An After Visit Summary was printed and given to the patient.  PDMP not reviewed this encounter.   , Elson Areas 08/26/20 1150

## 2020-08-26 NOTE — Discharge Instructions (Signed)
Use antibiotic ointment 2 times a day

## 2020-08-26 NOTE — ED Triage Notes (Signed)
Pt c/o dog scratch on the LT side of her face x last night while trimming her dogs nails. Tdap 4/21'.

## 2020-12-22 ENCOUNTER — Emergency Department
Admission: RE | Admit: 2020-12-22 | Discharge: 2020-12-22 | Disposition: A | Discharge status: Home / Self Care | Payer: Managed Care, Other (non HMO) | Source: Ambulatory Visit | Attending: Family Medicine | Admitting: Family Medicine

## 2020-12-22 ENCOUNTER — Other Ambulatory Visit: Payer: Self-pay

## 2020-12-22 VITALS — BP 110/73 | HR 76 | Temp 99.4°F | Resp 17 | Ht 66.0 in | Wt 220.0 lb

## 2020-12-22 DIAGNOSIS — R110 Nausea: Secondary | ICD-10-CM

## 2020-12-22 DIAGNOSIS — J0101 Acute recurrent maxillary sinusitis: Secondary | ICD-10-CM

## 2020-12-22 HISTORY — DX: Otitis media, unspecified, left ear: H66.92

## 2020-12-22 MED ORDER — MECLIZINE HCL 25 MG PO TABS: 25.0000 mg | ORAL_TABLET | Freq: Three times a day (TID) | ORAL | 0 refills | Status: DC | PRN

## 2020-12-22 MED ORDER — PREDNISONE 20 MG PO TABS: ORAL_TABLET | ORAL | 0 refills | Status: DC

## 2020-12-22 MED ORDER — AMOXICILLIN-POT CLAVULANATE 875-125 MG PO TABS: 1.0000 | ORAL_TABLET | Freq: Two times a day (BID) | ORAL | 0 refills | Status: AC

## 2020-12-22 MED ORDER — ONDANSETRON 4 MG PO TBDP: 4.0000 mg | ORAL_TABLET | Freq: Three times a day (TID) | ORAL | 0 refills | Status: DC | PRN

## 2020-12-22 NOTE — ED Provider Notes (Signed)
[facility]    CSN: [id number] Arrival date & time: 12/22/20  1348      History   Chief Complaint Chief Complaint  Patient presents with  . Otalgia  . Facial Pain    HPI Conception Amber Cruz is a 65 y.o. female.   Six days ago patient developed "stuffiness" in her left ear, increased sinus drainage/pressure, and intermittent vertigo.  She has a history of recurring otitis media with a T-tube present in her left ear.  She admits to chills but no fever.  She denies URI symptoms.  She has mild intermittent vertigo that she controls with Antivert, and is taking Zofran ODT for occasional nausea.  The history is provided by the patient.    Past Medical History:  Diagnosis Date  . Aneurysm (HCC)   . Anxiety   . Frequent infections of left ear   . Thyroid disease     Patient Active Problem List   Diagnosis Date Noted  . Cerebral aneurysm 01/29/2018  . Acute serous otitis media of left ear 08/21/2017  . Controlled type 2 diabetes mellitus without complication, without long-term current use of insulin (HCC) 08/11/2017  . Arthralgia of both hands 09/22/2016  . Acquired hypothyroidism 03/26/2014  . Adjustment disorder with anxious mood 03/26/2014    Past Surgical History:  Procedure Laterality Date  . DESCENDING AORTIC ANEURYSM REPAIR W/ STENT    . MYRINGOTOMY Left 2018    OB History   No obstetric history on file.      Home Medications    Prior to Admission medications   Medication Sig Start Date End Date Taking? Authorizing Provider  amoxicillin-clavulanate (AUGMENTIN) 875-125 MG tablet Take 1 tablet by mouth every 12 (twelve) hours for 10 days. 12/22/20 01/01/21 Yes Lattie Haw, MD  DULoxetine (CYMBALTA) 30 MG capsule Take 1 a day 07/31/20  Yes [provider]  levothyroxine (SYNTHROID, LEVOTHROID) 137 MCG tablet Take 137 mcg by mouth daily before breakfast.   Yes [provider]  LORazepam (ATIVAN) 1 MG tablet TAKE ONE (1) TABLET BY MOUTH  TWICE DAILY DAY AS NEEDED FOR ANXIETY 11/17/16  Yes [provider]  Multiple Vitamin (MULTI-VITAMIN) tablet Take 1 tablet by mouth daily.   Yes [provider]  ondansetron (ZOFRAN-ODT) 4 MG disintegrating tablet Take 1 tablet (4 mg total) by mouth every 8 (eight) hours as needed for nausea or vomiting. Dissolve under tongue. 12/22/20  Yes Lattie Haw, MD  predniSONE (DELTASONE) 20 MG tablet Take one tab by mouth twice daily for 4 days, then one daily for 3 days. Take with food. 12/22/20  Yes Lattie Haw, MD  meclizine (ANTIVERT) 25 MG tablet Take 1 tablet (25 mg total) by mouth 3 (three) times daily as needed for dizziness. 12/22/20   Lattie Haw, MD  OZEMPIC, 0.25 OR 0.5 MG/DOSE, 2 MG/1.5ML SOPN SMARTSIG:0.4 Milliliter(s) SUB-Q Once a Week 08/05/20   [provider]    Family History Family History  Problem Relation Age of Onset  . Alzheimer's disease Mother   . Hypertension Father   . Heart failure Father     Social History Social History   Tobacco Use  . Smoking status: Never Smoker  . Smokeless tobacco: Never Used  Vaping Use  . Vaping Use: Never used  Substance Use Topics  . Alcohol use: No  . Drug use: No     Allergies   Iodinated diagnostic agents, Ambien [zolpidem], Prochlorperazine, Honey bee venom protein [honey bee venom], and  Iodides   Review of Systems Review of Systems  No sore throat No cough No pleuritic pain No wheezing + nasal congestion + post-nasal drainage + sinus pain/pressure No itchy/red eyes No earache + dizziness No hemoptysis No SOB No fever/+ chills + nausea No vomiting No abdominal pain No diarrhea No urinary symptoms No skin rash + fatigue No myalgias No headache    Physical Exam Triage Vital Signs ED Triage Vitals  Enc Vitals Group     BP 12/22/20 1359 110/73     Pulse Rate 12/22/20 1359 76     Resp 12/22/20 1359 17     Temp 12/22/20 1359 99.4 F (37.4 C)     Temp Source  12/22/20 1359 Oral     SpO2 12/22/20 1359 95 %     Weight 12/22/20 1403 220 lb (99.8 kg)     Height 12/22/20 1403 5\' 6"  (1.676 m)     Head Circumference --      Peak Flow --      Pain Score 12/22/20 1403 3     Pain Loc --      Pain Edu? --      Excl. in GC? --    No data found.  Updated Vital Signs BP 110/73 (BP Location: Right Arm)   Pulse 76   Temp 99.4 F (37.4 C) (Oral)   Resp 17   Ht 5\' 6"  (1.676 m)   Wt 99.8 kg   SpO2 95%   BMI 35.51 kg/m   Visual Acuity Right Eye Distance:   Left Eye Distance:   Bilateral Distance:    Right Eye Near:   Left Eye Near:    Bilateral Near:     Physical Exam Nursing notes and Vital Signs reviewed. Appearance:  Patient appears stated age, and in no acute distress Eyes:  Pupils are equal, round, and reactive to light and accomodation.  Extraocular movement is intact.  Conjunctivae are not inflamed.  No nystagmus.  Ears:  Canals normal.  Right tympanic membrane normal.  Left tympanic membrane normal except for presence of T-tube.  T-tube appears patent without drainage. Nose:   Congested turbinates bilaterally.  Maxillary sinus tenderness is present on the left.  Pharynx:  Normal Neck:  Supple. No adenopathy. Lungs:  Clear to auscultation.  Breath sounds are equal.  Moving air well. Heart:  Regular rate and rhythm without murmurs, rubs, or gallops.  Abdomen:  Nontender without masses or hepatosplenomegaly.  Bowel sounds are present.  No CVA or flank tenderness.  Extremities:  No edema.  Skin:  No rash present.   UC Treatments / Results  Labs (all labs ordered are listed, but only abnormal results are displayed) Labs Reviewed - No data to display  EKG   Radiology No results found.  Procedures Procedures (including critical care time)  Medications Ordered in UC Medications - No data to display  Initial Impression / Assessment and Plan / UC Course  I have reviewed the triage vital signs and the nursing notes.  Pertinent  labs & imaging results that were available during my care of the patient were reviewed by me and considered in my medical decision making (see chart for details).    Rx for Augmentin and prednisone burst/taper. Refill Rx for Zofran ODT 4mg  and Antivert 25mg . Followup with Family Doctor if not improved in one week.    Final Clinical Impressions(s) / UC Diagnoses   Final diagnoses:  Acute recurrent maxillary sinusitis  Nausea without vomiting  Discharge Instructions     Try Pseudoephedrine (30mg , one or two every 4 to 6 hours) for sinus congestion.  Get adequate rest.   May use Afrin nasal spray (or generic oxymetazoline) each morning for about 5 days and then discontinue.  Also recommend using saline nasal spray several times daily and saline nasal irrigation (AYR is a common brand).  Use Flonase nasal spray each morning after using Afrin nasal spray and saline nasal irrigation.      ED Prescriptions    Medication Sig Dispense Auth. Provider   amoxicillin-clavulanate (AUGMENTIN) 875-125 MG tablet Take 1 tablet by mouth every 12 (twelve) hours for 10 days. 20 tablet , MD   predniSONE (DELTASONE) 20 MG tablet Take one tab by mouth twice daily for 4 days, then one daily for 3 days. Take with food. 12 tablet Lattie Haw, MD   ondansetron (ZOFRAN-ODT) 4 MG disintegrating tablet Take 1 tablet (4 mg total) by mouth every 8 (eight) hours as needed for nausea or vomiting. Dissolve under tongue. 12 tablet Lattie Haw, MD   meclizine (ANTIVERT) 25 MG tablet Take 1 tablet (25 mg total) by mouth 3 (three) times daily as needed for dizziness. 20 tablet Lattie Haw, MD        Lattie Haw, MD 12/23/20 [phone]

## 2020-12-22 NOTE — ED Triage Notes (Signed)
Sinus pressure w/ decreased appetite & dizziness since last Thursday  Took zofran at 1300 Hx of ear infections w/ vertigo  Chills - denies fever COVID vaccine & booster

## 2020-12-22 NOTE — Discharge Instructions (Signed)
Try Pseudoephedrine (30mg, one or two every 4 to 6 hours) for sinus congestion.  Get adequate rest.   May use Afrin nasal spray (or generic oxymetazoline) each morning for about 5 days and then discontinue.  Also recommend using saline nasal spray several times daily and saline nasal irrigation (AYR is a common brand).  Use Flonase nasal spray each morning after using Afrin nasal spray and saline nasal irrigation.   

## 2021-04-24 ENCOUNTER — Other Ambulatory Visit: Payer: Self-pay

## 2021-04-24 ENCOUNTER — Emergency Department
Admission: EM | Admit: 2021-04-24 | Discharge: 2021-04-24 | Disposition: A | Discharge status: Home / Self Care | Payer: Managed Care, Other (non HMO) | Source: Home / Self Care

## 2021-04-24 DIAGNOSIS — J0100 Acute maxillary sinusitis, unspecified: Secondary | ICD-10-CM | POA: Diagnosis not present

## 2021-04-24 DIAGNOSIS — J3489 Other specified disorders of nose and nasal sinuses: Secondary | ICD-10-CM

## 2021-04-24 DIAGNOSIS — R6883 Chills (without fever): Secondary | ICD-10-CM

## 2021-04-24 DIAGNOSIS — J309 Allergic rhinitis, unspecified: Secondary | ICD-10-CM

## 2021-04-24 LAB — POC SARS CORONAVIRUS 2 AG -  ED: SARS Coronavirus 2 Ag: NEGATIVE

## 2021-04-24 MED ORDER — AMOXICILLIN-POT CLAVULANATE 875-125 MG PO TABS: 1.0000 | ORAL_TABLET | Freq: Two times a day (BID) | ORAL | 0 refills | Status: AC

## 2021-04-24 MED ORDER — FEXOFENADINE HCL 180 MG PO TABS: 180.0000 mg | ORAL_TABLET | Freq: Every day | ORAL | 0 refills | Status: DC

## 2021-04-24 MED ORDER — PREDNISONE 20 MG PO TABS: ORAL_TABLET | ORAL | 0 refills | Status: DC

## 2021-04-24 NOTE — Discharge Instructions (Addendum)
Advised patient to take medication as directed with food to completion.  Encouraged patient increase daily water intake while taking this medication. 

## 2021-04-24 NOTE — ED Provider Notes (Signed)
[name]    CSN: [id number] Arrival date & time: 04/24/21  1212      History   Chief Complaint Chief Complaint  Patient presents with   Nasal Congestion   Facial Pain   Sore Throat    HPI Amber Cruz is a 65 y.o. female.   HPI 64 year old female presents with sinus nasal congestion, sinus pressure and green purulent rhinorrhea for the past 3 days.  Patient is vaccinated for COVID-19 and reports no recent COVID-19 test taken.  Past Medical History:  Diagnosis Date   Aneurysm (HCC)    Anxiety    Frequent infections of left ear    Thyroid disease     Patient Active Problem List   Diagnosis Date Noted   Cerebral aneurysm 01/29/2018   Acute serous otitis media of left ear 08/21/2017   Controlled type 2 diabetes mellitus without complication, without long-term current use of insulin (HCC) 08/11/2017   Arthralgia of both hands 09/22/2016   Acquired hypothyroidism 03/26/2014   Adjustment disorder with anxious mood 03/26/2014    Past Surgical History:  Procedure Laterality Date   DESCENDING AORTIC ANEURYSM REPAIR W/ STENT     MYRINGOTOMY Left 2018    OB History   No obstetric history on file.      Home Medications    Prior to Admission medications   Medication Sig Start Date End Date Taking? Authorizing Provider  amoxicillin-clavulanate (AUGMENTIN) 875-125 MG tablet Take 1 tablet by mouth every 12 (twelve) hours for 10 days. 04/24/21 05/04/21 Yes Trevor Iha, FNP  fexofenadine [facility] ALLERGY) 180 MG tablet Take 1 tablet (180 mg total) by mouth daily for 15 days. 04/24/21 05/09/21 Yes Trevor Iha, FNP  predniSONE (DELTASONE) 20 MG tablet Take 3 tabs PO daily x 5 days. 04/24/21  Yes Trevor Iha, FNP  DULoxetine (CYMBALTA) 30 MG capsule Take 1 a day 07/31/20   [provider]  levothyroxine (SYNTHROID, LEVOTHROID) 137 MCG tablet Take 137 mcg by mouth daily before breakfast.    [provider]  LORazepam (ATIVAN) 1 MG tablet  TAKE ONE (1) TABLET BY MOUTH TWICE DAILY DAY AS NEEDED FOR ANXIETY 11/17/16   [provider]  meclizine (ANTIVERT) 25 MG tablet Take 1 tablet (25 mg total) by mouth 3 (three) times daily as needed for dizziness. 12/22/20   Lattie Haw, MD  Multiple Vitamin (MULTI-VITAMIN) tablet Take 1 tablet by mouth daily.    [provider]  ondansetron (ZOFRAN-ODT) 4 MG disintegrating tablet Take 1 tablet (4 mg total) by mouth every 8 (eight) hours as needed for nausea or vomiting. Dissolve under tongue. 12/22/20   Lattie Haw, MD  OZEMPIC, 0.25 OR 0.5 MG/DOSE, 2 MG/1.5ML SOPN SMARTSIG:0.4 Milliliter(s) SUB-Q Once a Week 08/05/20   [provider]    Family History Family History  Problem Relation Age of Onset   Alzheimer's disease Mother    Hypertension Father    Heart failure Father     Social History Social History   Tobacco Use   Smoking status: Never   Smokeless tobacco: Never  Vaping Use   Vaping Use: Never used  Substance Use Topics   Alcohol use: No   Drug use: No     Allergies   Iodinated diagnostic agents, Ambien [zolpidem], Prochlorperazine, Honey bee venom protein [honey bee venom], and Iodides   Review of Systems Review of Systems  HENT:  Positive for congestion, postnasal drip, sinus pressure and sore throat.   All other systems reviewed  and are negative.   Physical Exam Triage Vital Signs ED Triage Vitals  Enc Vitals Group     BP 04/24/21 1322 111/71     Pulse Rate 04/24/21 1322 76     Resp 04/24/21 1322 20     Temp 04/24/21 1322 99.2 F (37.3 C)     Temp Source 04/24/21 1322 Oral     SpO2 04/24/21 1322 96 %     Weight 04/24/21 1318 180 lb (81.6 kg)     Height 04/24/21 1318 5\' 6"  (1.676 m)     Head Circumference --      Peak Flow --      Pain Score 04/24/21 1318 7     Pain Loc --      Pain Edu? --      Excl. in GC? --    No data found.  Updated Vital Signs BP 111/71   Pulse 76   Temp 99.2 F (37.3 C) (Oral)   Resp  20   Ht 5\' 6"  (1.676 m)   Wt 180 lb (81.6 kg)   SpO2 96%   BMI 29.05 kg/m    Physical Exam Vitals and nursing note reviewed.  Constitutional:      General: She is not in acute distress.    Appearance: Normal appearance. She is normal weight. She is not ill-appearing.  HENT:     Head: Normocephalic.     Right Ear: Tympanic membrane, ear canal and external ear normal.     Left Ear: Tympanic membrane, ear canal and external ear normal.     Ears:     Comments: Eustachian tube dysfunction noted bilaterally    Nose:     Right Turbinates: Enlarged.     Left Turbinates: Enlarged.     Right Sinus: Frontal sinus tenderness present.     Left Sinus: Frontal sinus tenderness present.     Comments: Turbinates are erythematous, TTP over bilateral frontal sinuses    Mouth/Throat:     Lips: Pink.     Pharynx: Oropharynx is clear. Uvula midline. No posterior oropharyngeal erythema.     Comments: Moderate amount of clear drainage of posterior oropharynx noted Cardiovascular:     Rate and Rhythm: Normal rate and regular rhythm.     Pulses: Normal pulses.     Heart sounds: Normal heart sounds. No murmur heard. Pulmonary:     Effort: Pulmonary effort is normal.     Breath sounds: Normal breath sounds. No stridor. No wheezing, rhonchi or rales.     Comments: No adventitious breath sounds noted Musculoskeletal:        General: Normal range of motion.     Cervical back: Normal range of motion and neck supple. Tenderness present.  Lymphadenopathy:     Cervical: Cervical adenopathy present.  Skin:    General: Skin is warm and dry.  Neurological:     General: No focal deficit present.     Mental Status: She is alert and oriented to person, place, and time. Mental status is at baseline.  Psychiatric:        Mood and Affect: Mood normal.        Behavior: Behavior normal.     UC Treatments / Results  Labs (all labs ordered are listed, but only abnormal results are displayed) Labs Reviewed   POC SARS CORONAVIRUS 2 AG -  ED    EKG   Radiology No results found.  Procedures Procedures (including critical care time)  Medications Ordered in UC  Medications - No data to display  Initial Impression / Assessment and Plan / UC Course  I have reviewed the triage vital signs and the nursing notes.  Pertinent labs & imaging results that were available during my care of the patient were reviewed by me and considered in my medical decision making (see chart for details).     MDM: 1. Acute maxillary sinusitis-Rx'd Augmentin twice daily for 5 days; 2.  Sinus pressure-Rx'd prednisone burst x 5 days; 3.  Allergic rhinitis-Rx'd Allegra x 5 days, then as needed; 4.  Chills-rapid COVID-19 negative. Patient discharged home, hemodynamically stable. Final Clinical Impressions(s) / UC Diagnoses   Final diagnoses:  Acute maxillary sinusitis, recurrence not specified  Sinus pressure  Allergic rhinitis, unspecified seasonality, unspecified trigger  Chills     Discharge Instructions      Advised patient to take medication as directed with food to completion.  Encouraged patient increase daily water intake while taking this medication.     ED Prescriptions     Medication Sig Dispense Auth. Provider   amoxicillin-clavulanate (AUGMENTIN) 875-125 MG tablet Take 1 tablet by mouth every 12 (twelve) hours for 10 days. 20 tablet Trevor Iha, FNP   predniSONE (DELTASONE) 20 MG tablet Take 3 tabs PO daily x 5 days. 15 tablet Trevor Iha, FNP   fexofenadine [facility] ALLERGY) 180 MG tablet Take 1 tablet (180 mg total) by mouth daily for 15 days. 15 tablet Trevor Iha, FNP      PDMP not reviewed this encounter.   Trevor Iha, FNP 04/24/21 1454

## 2021-04-24 NOTE — ED Triage Notes (Signed)
Pt presents to Urgent Care with c/o facial pain and nasal congestion (green mucus) x 3 days. Also states throat is sore, but attributes this to sinus drainage. No home COVID test; pt is vaccinated.

## 2021-10-18 IMAGING — DX DG KNEE COMPLETE 4+V*L*
4 series · 4 of 4 positions shown · non-contrast
Comparison: None.

CLINICAL DATA: Recent fall with left knee pain, initial encounter

EXAM:
LEFT KNEE - COMPLETE 4+ VIEW

[knee ap]
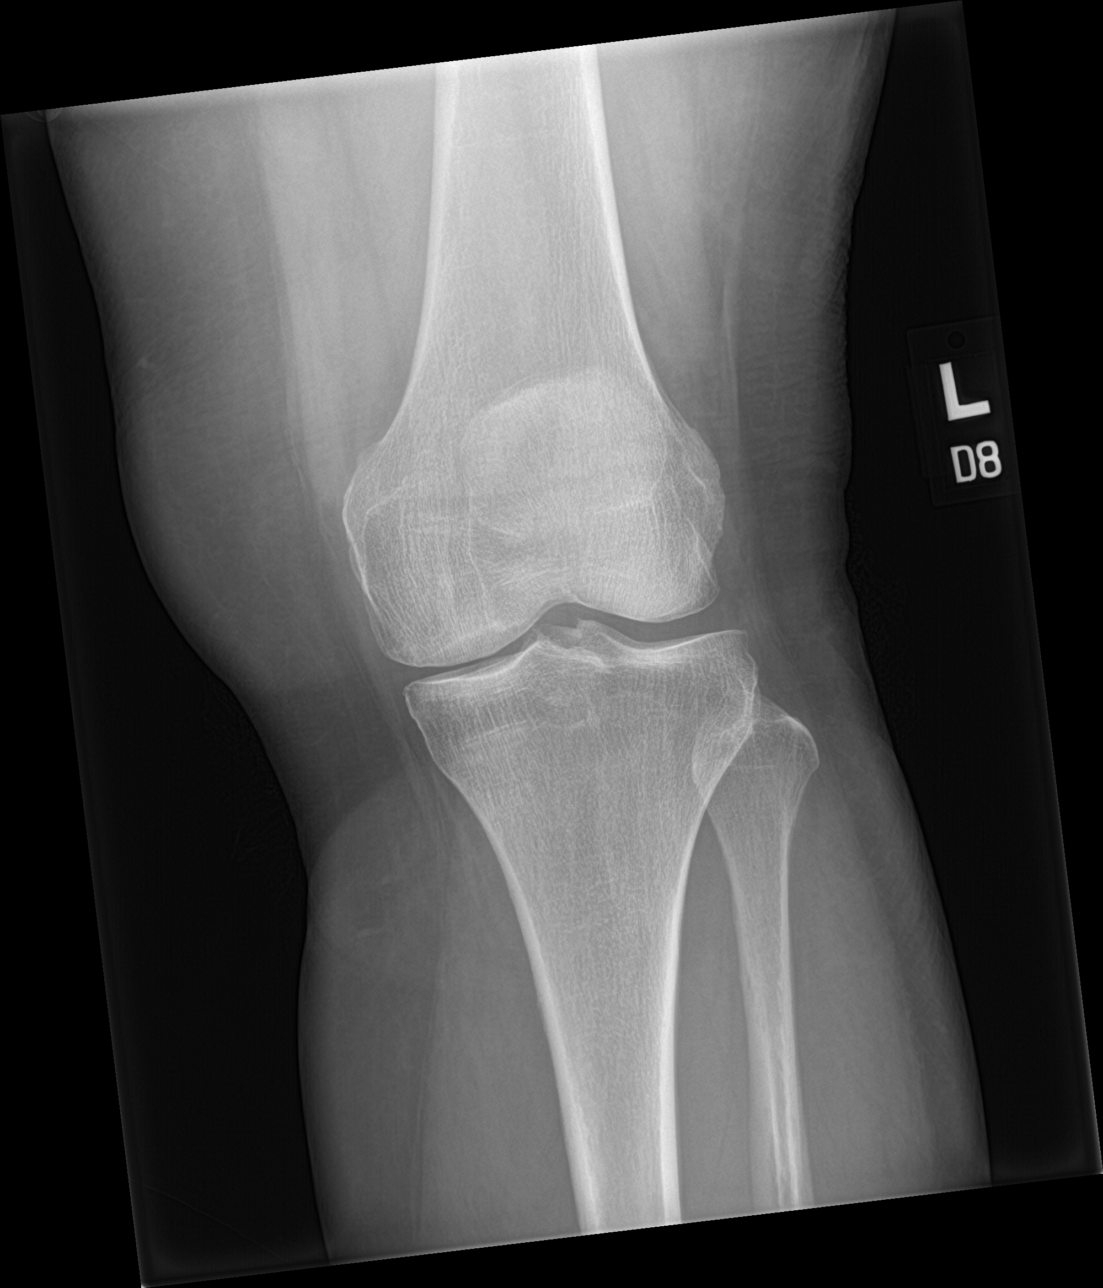

[knee lat]
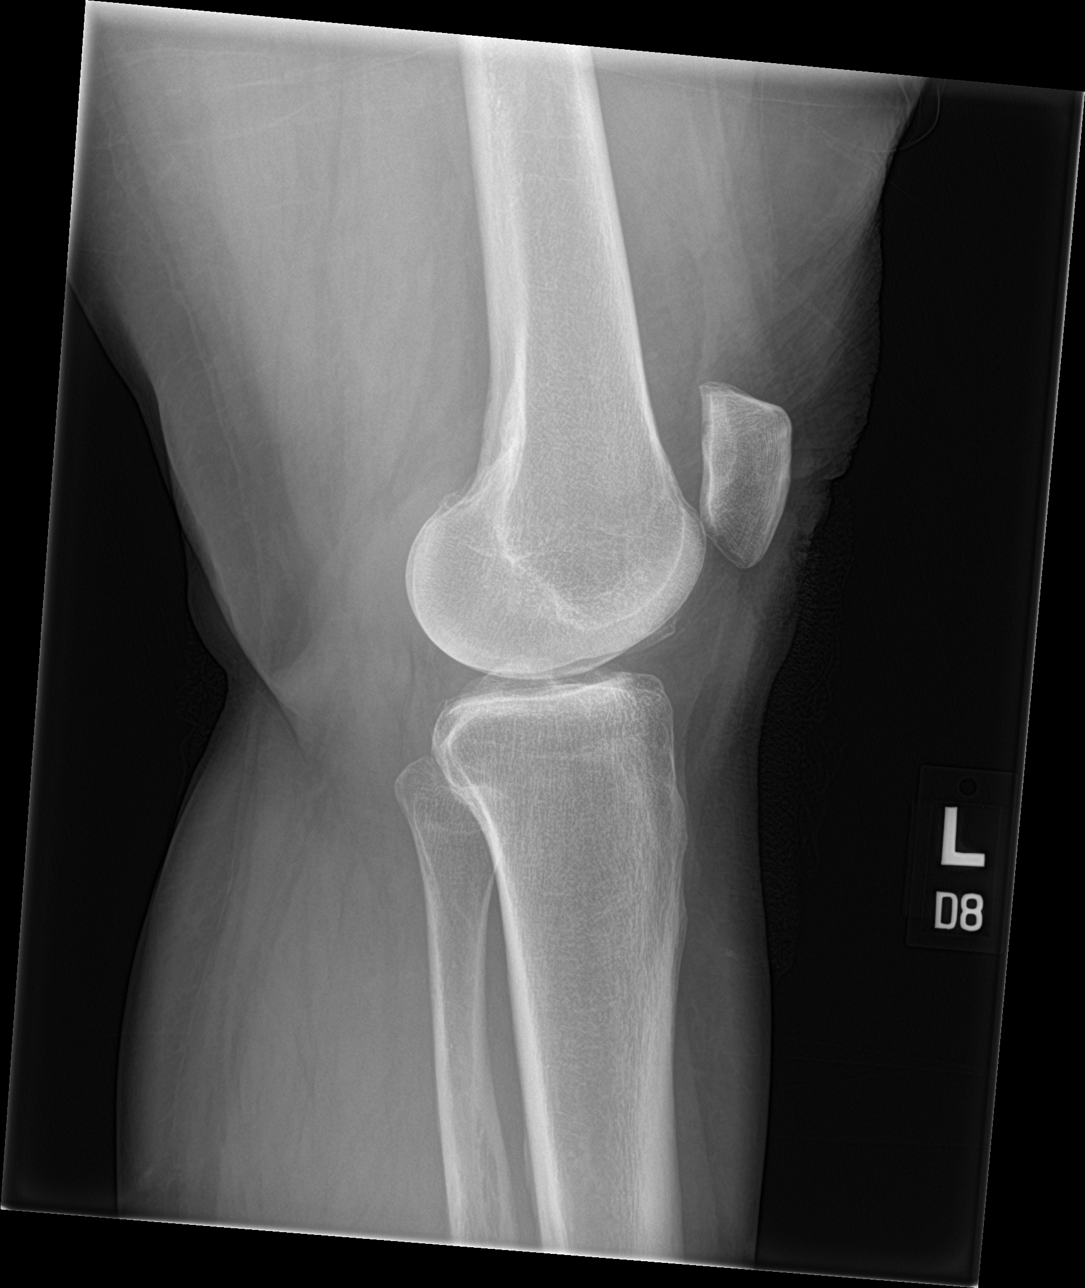

[knee obl (1 of 2)]
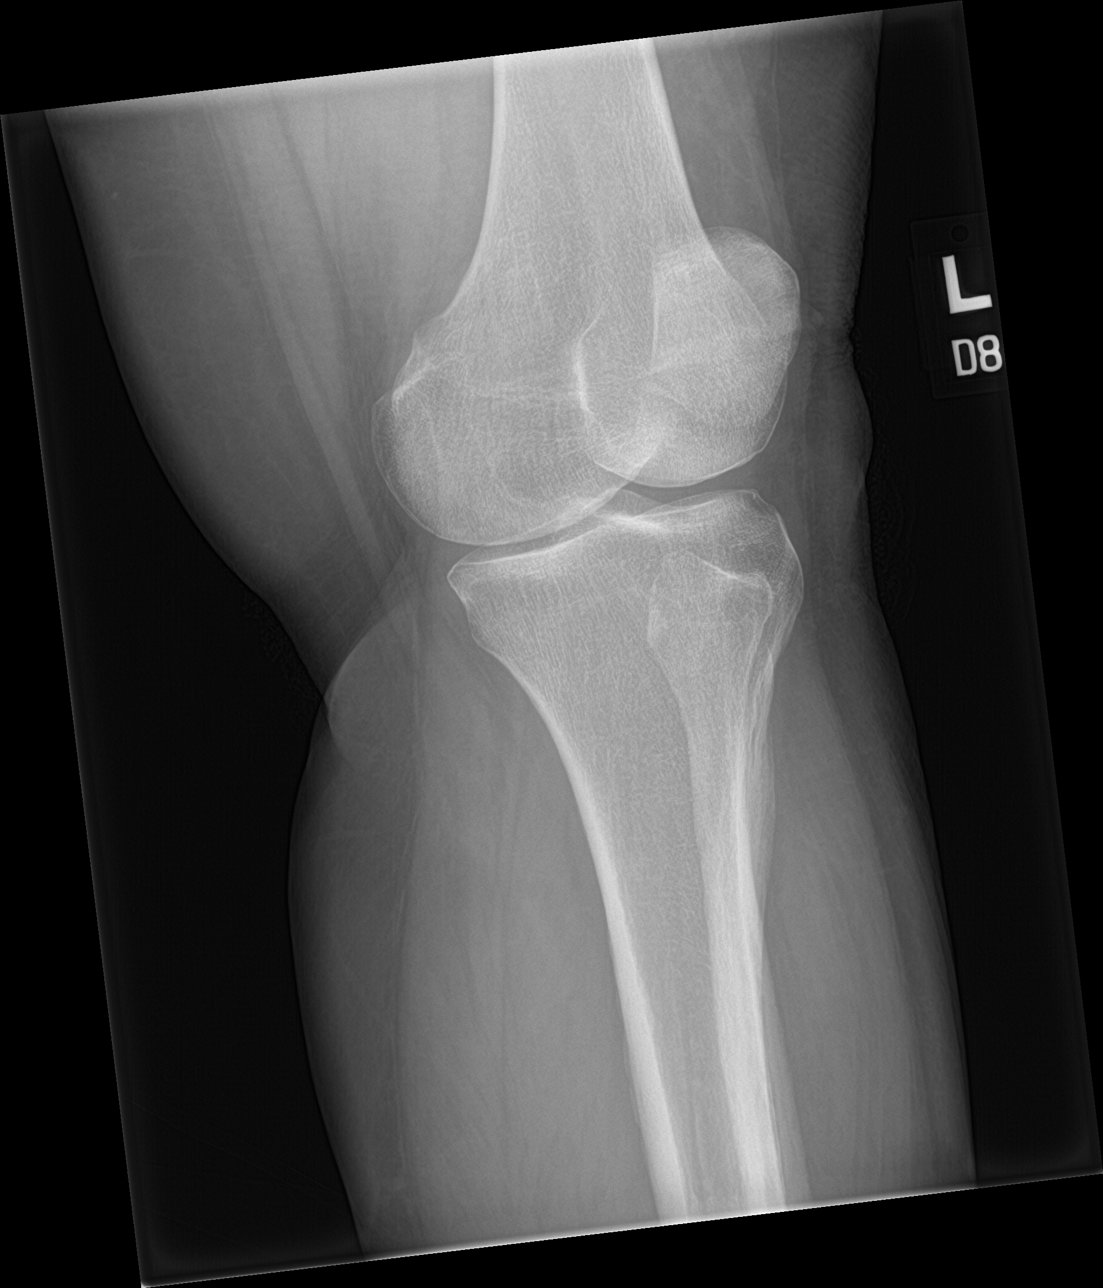

[knee obl (2 of 2)]
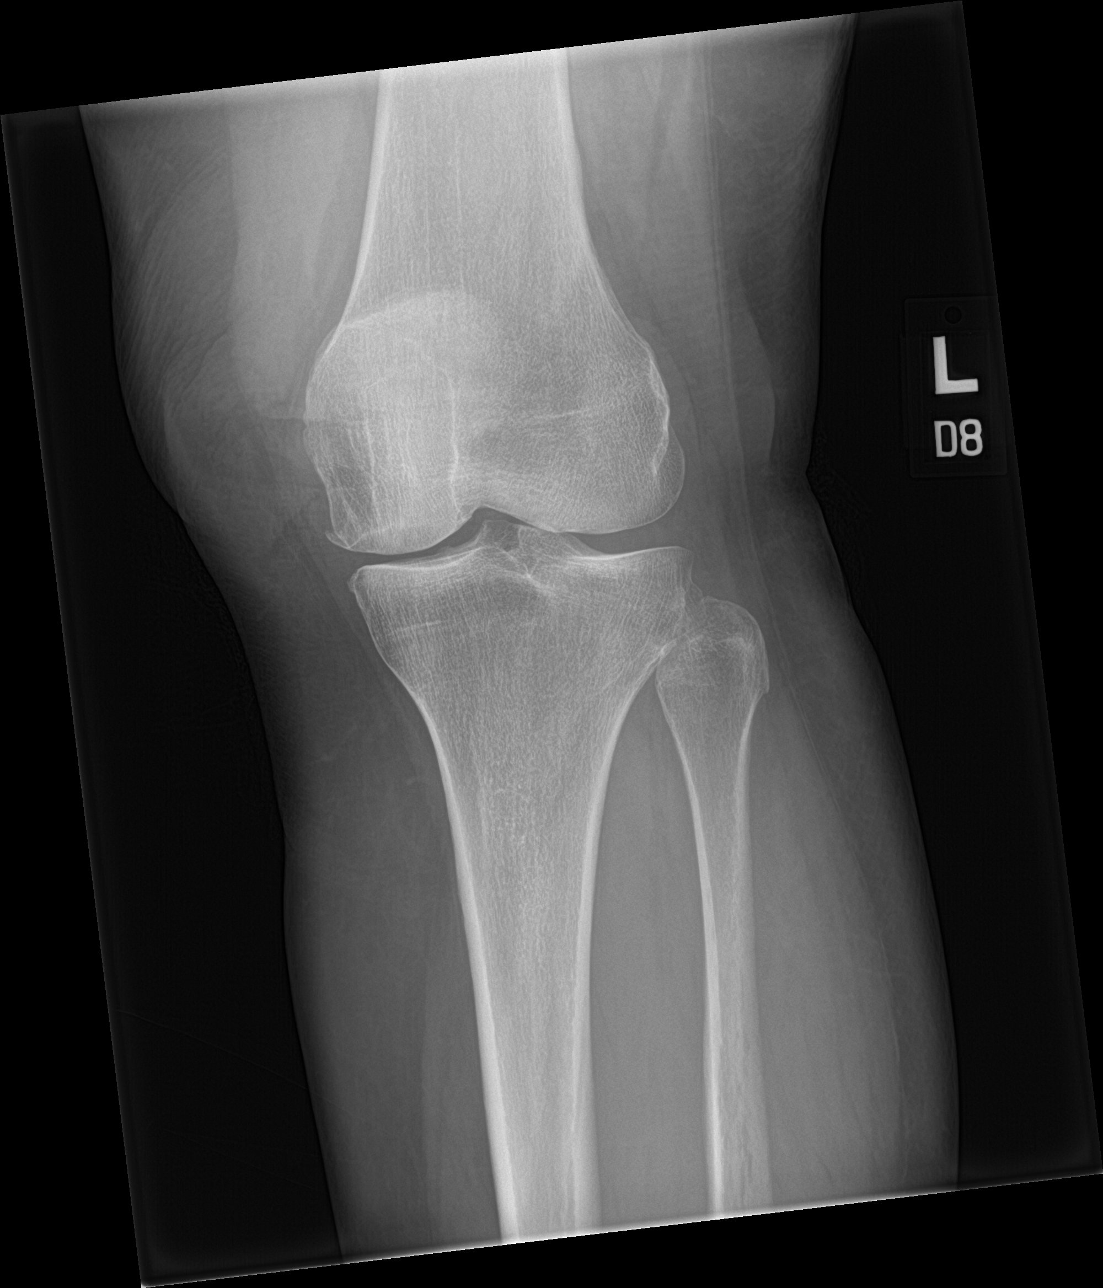

[4 of 4 positions shown; findings below may reference images not displayed]

FINDINGS: Soft tissue injury is noted anteriorly consistent with the given
clinical history. No acute fracture or dislocation is noted. No
other focal abnormality is noted.
IMPRESSION: Soft tissue injury anteriorly without bony abnormality.

## 2023-09-23 ENCOUNTER — Ambulatory Visit
Admission: EM | Admit: 2023-09-23 | Discharge: 2023-09-23 | Disposition: A | Discharge status: Home / Self Care | Payer: Managed Care, Other (non HMO) | Attending: Family Medicine | Admitting: Family Medicine

## 2023-09-23 ENCOUNTER — Other Ambulatory Visit: Payer: Self-pay

## 2023-09-23 DIAGNOSIS — R059 Cough, unspecified: Secondary | ICD-10-CM

## 2023-09-23 DIAGNOSIS — J0100 Acute maxillary sinusitis, unspecified: Secondary | ICD-10-CM

## 2023-09-23 LAB — POC SARS CORONAVIRUS 2 AG -  ED: SARS Coronavirus 2 Ag: NEGATIVE

## 2023-09-23 LAB — POCT INFLUENZA A/B
Influenza A, POC: NEGATIVE
Influenza B, POC: NEGATIVE

## 2023-09-23 MED ORDER — AMOXICILLIN-POT CLAVULANATE 875-125 MG PO TABS: 1.0000 | ORAL_TABLET | Freq: Two times a day (BID) | ORAL | 0 refills | Status: DC

## 2023-09-23 MED ORDER — BENZONATATE 200 MG PO CAPS: 200.0000 mg | ORAL_CAPSULE | Freq: Three times a day (TID) | ORAL | 0 refills | Status: AC | PRN

## 2023-09-23 NOTE — Discharge Instructions (Addendum)
Advised patient to take medications as directed with food to completion.  Advised may take Tessalon capsules daily or as needed for cough.  Encourage increase daily water intake to 64 ounces per day while taking these medications.  Advised if symptoms worsen and/or unresolved please follow-up with PCP or here for further evaluation.

## 2023-09-23 NOTE — ED Provider Notes (Signed)
[name]    CSN: [id number] Arrival date & time: 09/23/23  1221      History   Chief Complaint Chief Complaint  Patient presents with   Cough   Fever    HPI Amber Cruz is a 67 y.o. female.   HPI 67 year old female presents with tightness in chest dry cough and fever since yesterday.  PMH significant for anxiety, aneurysm, and T2DM.  Past Medical History:  Diagnosis Date   Aneurysm (HCC)    Anxiety    Frequent infections of left ear    Thyroid disease     Patient Active Problem List   Diagnosis Date Noted   Cerebral aneurysm 01/29/2018   Acute serous otitis media of left ear 08/21/2017   Controlled type 2 diabetes mellitus without complication, without long-term current use of insulin (HCC) 08/11/2017   Arthralgia of both hands 09/22/2016   Acquired hypothyroidism 03/26/2014   Adjustment disorder with anxious mood 03/26/2014    Past Surgical History:  Procedure Laterality Date   DESCENDING AORTIC ANEURYSM REPAIR W/ STENT     MYRINGOTOMY Left 2018    OB History   No obstetric history on file.      Home Medications    Prior to Admission medications   Medication Sig Start Date End Date Taking? Authorizing Provider  amoxicillin-clavulanate (AUGMENTIN) 875-125 MG tablet Take 1 tablet by mouth every 12 (twelve) hours. 09/23/23  Yes Trevor Iha, FNP  benzonatate (TESSALON) 200 MG capsule Take 1 capsule (200 mg total) by mouth 3 (three) times daily as needed for up to 7 days. 09/23/23 09/30/23 Yes Trevor Iha, FNP  DULoxetine (CYMBALTA) 30 MG capsule Take 1 a day 07/31/20   [provider]  fexofenadine (ALLEGRA ALLERGY) 180 MG tablet Take 1 tablet (180 mg total) by mouth daily for 15 days. 04/24/21 05/09/21  Trevor Iha, FNP  levothyroxine (SYNTHROID, LEVOTHROID) 137 MCG tablet Take 137 mcg by mouth daily before breakfast.    [provider]  LORazepam (ATIVAN) 1 MG tablet TAKE ONE (1) TABLET BY MOUTH TWICE DAILY DAY AS  NEEDED FOR ANXIETY 11/17/16   [provider]  meclizine (ANTIVERT) 25 MG tablet Take 1 tablet (25 mg total) by mouth 3 (three) times daily as needed for dizziness. 12/22/20   Lattie Haw, MD  Multiple Vitamin (MULTI-VITAMIN) tablet Take 1 tablet by mouth daily.    [provider]  ondansetron (ZOFRAN-ODT) 4 MG disintegrating tablet Take 1 tablet (4 mg total) by mouth every 8 (eight) hours as needed for nausea or vomiting. Dissolve under tongue. 12/22/20   Lattie Haw, MD  OZEMPIC, 0.25 OR 0.5 MG/DOSE, 2 MG/1.5ML SOPN SMARTSIG:0.4 Milliliter(s) SUB-Q Once a Week 08/05/20   [provider]  predniSONE (DELTASONE) 20 MG tablet Take 3 tabs PO daily x 5 days. 04/24/21   Trevor Iha, FNP    Family History Family History  Problem Relation Age of Onset   Alzheimer's disease Mother    Hypertension Father    Heart failure Father     Social History Social History   Tobacco Use   Smoking status: Never   Smokeless tobacco: Never  Vaping Use   Vaping status: Never Used  Substance Use Topics   Alcohol use: No   Drug use: No     Allergies   Iodinated contrast media, Ambien [zolpidem], Prochlorperazine, Honey bee venom protein [honey bee venom], and Iodides   Review of Systems Review of Systems  Constitutional:  Positive for fever.  Respiratory:  Positive for cough.   All other systems reviewed and are negative.    Physical Exam Triage Vital Signs ED Triage Vitals  Encounter Vitals Group     BP 09/23/23 1243 119/76     Systolic BP Percentile --      Diastolic BP Percentile --      Pulse Rate 09/23/23 1242 62     Resp 09/23/23 1242 16     Temp 09/23/23 1242 99 F (37.2 C)     Temp src --      SpO2 09/23/23 1242 98 %     Weight --      Height --      Head Circumference --      Peak Flow --      Pain Score 09/23/23 1241 0     Pain Loc --      Pain Education --      Exclude from Growth Chart --    No data found.  Updated Vital  Signs BP 119/76   Pulse 62   Temp 99 F (37.2 C)   Resp 16   SpO2 98%    Physical Exam Vitals and nursing note reviewed.  Constitutional:      Appearance: Normal appearance. She is normal weight. She is ill-appearing.  HENT:     Head: Normocephalic and atraumatic.     Right Ear: Tympanic membrane and external ear normal.     Left Ear: Tympanic membrane and external ear normal.     Ears:     Comments: Significant eustachian tube dysfunction noted bilaterally    Nose: Nose normal.     Mouth/Throat:     Mouth: Mucous membranes are moist.     Pharynx: Oropharynx is clear.  Eyes:     Extraocular Movements: Extraocular movements intact.     Conjunctiva/sclera: Conjunctivae normal.     Pupils: Pupils are equal, round, and reactive to light.  Cardiovascular:     Rate and Rhythm: Normal rate and regular rhythm.     Pulses: Normal pulses.     Heart sounds: Normal heart sounds.  Pulmonary:     Effort: Pulmonary effort is normal.     Breath sounds: Normal breath sounds. No wheezing, rhonchi or rales.     Comments: Infrequent nonproductive cough on exam Genitourinary:    General: Normal vulva.     Rectum: Normal.  Musculoskeletal:        General: Normal range of motion.     Cervical back: Normal range of motion and neck supple.  Skin:    General: Skin is warm and dry.  Neurological:     General: No focal deficit present.     Mental Status: She is alert and oriented to person, place, and time. Mental status is at baseline.  Psychiatric:        Mood and Affect: Mood normal.        Behavior: Behavior normal.      UC Treatments / Results  Labs (all labs ordered are listed, but only abnormal results are displayed) Labs Reviewed  POCT INFLUENZA A/B  POC SARS CORONAVIRUS 2 AG -  ED    EKG   Radiology No results found.  Procedures Procedures (including critical care time)  Medications Ordered in UC Medications - No data to display  Initial Impression / Assessment  and Plan / UC Course  I have reviewed the triage vital signs and the nursing notes.  Pertinent labs & imaging results that were  available during my care of the patient were reviewed by me and considered in my medical decision making (see chart for details).     MDM: 1.  Subacute maxillary sinusitis- Rx'd Augmentin 875/125 mg tablet: Take 1 tablet twice daily x 7 days: 2.  Cough, unspecified type-Rx'd Tessalon 200 mg capsules: Take 1 capsule 3 times daily, as needed for cough. Advised patient to take medications as directed with food to completion.  Advised may take Tessalon capsules daily or as needed for cough.  Encourage increase daily water intake to 64 ounces per day while taking these medications.  Advised if symptoms worsen and/or unresolved please follow-up with PCP or here for further evaluation.  Patient discharged home, hemodynamically stable. Final Clinical Impressions(s) / UC Diagnoses   Final diagnoses:  Subacute maxillary sinusitis  Cough, unspecified type     Discharge Instructions      Advised patient to take medications as directed with food to completion.  Advised may take Tessalon capsules daily or as needed for cough.  Encourage increase daily water intake to 64 ounces per day while taking these medications.  Advised if symptoms worsen and/or unresolved please follow-up with PCP or here for further evaluation.     ED Prescriptions     Medication Sig Dispense Auth. Provider   amoxicillin-clavulanate (AUGMENTIN) 875-125 MG tablet Take 1 tablet by mouth every 12 (twelve) hours. 14 tablet Trevor Iha, FNP   benzonatate (TESSALON) 200 MG capsule Take 1 capsule (200 mg total) by mouth 3 (three) times daily as needed for up to 7 days. 40 capsule Trevor Iha, FNP      PDMP not reviewed this encounter.   Trevor Iha, FNP 09/23/23 1406

## 2023-09-23 NOTE — ED Triage Notes (Signed)
Pt presents to uc with co of tightness in chest, dry cough and fever since yesterdays. Tylenol yesterday.

## 2023-10-20 ENCOUNTER — Ambulatory Visit
Admission: EM | Admit: 2023-10-20 | Discharge: 2023-10-20 | Disposition: A | Discharge status: Home / Self Care | Payer: Managed Care, Other (non HMO) | Attending: Family Medicine | Admitting: Family Medicine

## 2023-10-20 ENCOUNTER — Encounter: Payer: Self-pay | Admitting: Emergency Medicine

## 2023-10-20 ENCOUNTER — Telehealth: Payer: Self-pay

## 2023-10-20 ENCOUNTER — Other Ambulatory Visit: Payer: Self-pay

## 2023-10-20 DIAGNOSIS — R0981 Nasal congestion: Secondary | ICD-10-CM | POA: Diagnosis not present

## 2023-10-20 DIAGNOSIS — J0100 Acute maxillary sinusitis, unspecified: Secondary | ICD-10-CM | POA: Diagnosis not present

## 2023-10-20 MED ORDER — PREDNISONE 20 MG PO TABS: ORAL_TABLET | ORAL | 0 refills | Status: DC

## 2023-10-20 MED ORDER — DOXYCYCLINE HYCLATE 100 MG PO CAPS: 100.0000 mg | ORAL_CAPSULE | Freq: Two times a day (BID) | ORAL | 0 refills | Status: DC

## 2023-10-20 MED ORDER — DOXYCYCLINE HYCLATE 100 MG PO CAPS: 100.0000 mg | ORAL_CAPSULE | Freq: Two times a day (BID) | ORAL | 0 refills | Status: AC

## 2023-10-20 NOTE — Discharge Instructions (Addendum)
Advised patient to take medication as directed with food to completion.  Advised patient to take prednisone with first dose of doxycycline for the next 5 of 7 days.  Encouraged to increase daily water intake to 64 ounces per day while taking these medications.  Advised if symptoms worsen and/or unresolved please follow-up with PCP, ENT, or here for further evaluation.

## 2023-10-20 NOTE — Telephone Encounter (Signed)
Patient's husband calling in and states her medications from her visit this morning were sent in to the wrong pharmacy. Informed the Patient's husband that medication went to [facility] in [facility]. Patient and husband request the medication be sent I to [facility] in [place] [address].   Medication sent in to preferred pharmacy per protocol. Patient husband informed and verbalized understanding.

## 2023-10-20 NOTE — ED Triage Notes (Signed)
Patient c/o possible sinus infection, green nasal drainage, head pressure, slight cough x 3 days.  Patient recently completed a round of antibiotics for a sinus infection.  Patient denies any OTC cold meds.

## 2023-10-20 NOTE — ED Provider Notes (Addendum)
[name]    CSN: [id number] Arrival date & time: 10/20/23  1130      History   Chief Complaint Chief Complaint  Patient presents with   Nasal Drainage    HPI Marisel Tostenson is a 68 y.o. female.   HPI Pleasant 68 year old female presents with possible sinus infection with green nasal drainage, head pressure and slight cough for 3 to 4 days.  Patient reports a recently completed antibiotics for sinus infection.  PMH significant for cerebral aneurysm, thyroid disease, and anxiety.  Patient evaluated by me here on 09/23/2023 for subacute maxillary sinusitis.  Please see epic for that encounter note.  Past Medical History:  Diagnosis Date   Aneurysm (HCC)    Anxiety    Frequent infections of left ear    Thyroid disease     Patient Active Problem List   Diagnosis Date Noted   Cerebral aneurysm 01/29/2018   Acute serous otitis media of left ear 08/21/2017   Controlled type 2 diabetes mellitus without complication, without long-term current use of insulin (HCC) 08/11/2017   Arthralgia of both hands 09/22/2016   Acquired hypothyroidism 03/26/2014   Adjustment disorder with anxious mood 03/26/2014    Past Surgical History:  Procedure Laterality Date   DESCENDING AORTIC ANEURYSM REPAIR W/ STENT     MYRINGOTOMY Left 2018    OB History   No obstetric history on file.      Home Medications    Prior to Admission medications   Medication Sig Start Date End Date Taking? Authorizing Provider  doxycycline (VIBRAMYCIN) 100 MG capsule Take 1 capsule (100 mg total) by mouth 2 (two) times daily for 7 days. 10/20/23 10/27/23 Yes Trevor Iha, FNP  DULoxetine (CYMBALTA) 30 MG capsule Take 1 a day 07/31/20  Yes [provider]  levothyroxine (SYNTHROID, LEVOTHROID) 137 MCG tablet Take 137 mcg by mouth daily before breakfast.   Yes [provider]  LORazepam (ATIVAN) 1 MG tablet TAKE ONE (1) TABLET BY MOUTH TWICE DAILY DAY AS NEEDED FOR ANXIETY 11/17/16   Yes [provider]  Multiple Vitamin (MULTI-VITAMIN) tablet Take 1 tablet by mouth daily.   Yes [provider]  predniSONE (DELTASONE) 20 MG tablet Take 3 tabs PO daily x 5 days. 10/20/23  Yes Trevor Iha, FNP  fexofenadine [facility] ALLERGY) 180 MG tablet Take 1 tablet (180 mg total) by mouth daily for 15 days. 04/24/21 05/09/21  Trevor Iha, FNP  meclizine (ANTIVERT) 25 MG tablet Take 1 tablet (25 mg total) by mouth 3 (three) times daily as needed for dizziness. 12/22/20   Lattie Haw, MD  ondansetron (ZOFRAN-ODT) 4 MG disintegrating tablet Take 1 tablet (4 mg total) by mouth every 8 (eight) hours as needed for nausea or vomiting. Dissolve under tongue. 12/22/20   Lattie Haw, MD  OZEMPIC, 0.25 OR 0.5 MG/DOSE, 2 MG/1.5ML SOPN SMARTSIG:0.4 Milliliter(s) SUB-Q Once a Week 08/05/20   [provider]    Family History Family History  Problem Relation Age of Onset   Alzheimer's disease Mother    Hypertension Father    Heart failure Father     Social History Social History   Tobacco Use   Smoking status: Never   Smokeless tobacco: Never  Vaping Use   Vaping status: Never Used  Substance Use Topics   Alcohol use: No   Drug use: No     Allergies   Iodinated contrast media, Ambien [zolpidem], Prochlorperazine, Honey bee venom protein [honey bee venom], and Iodides  Review of Systems Review of Systems  HENT:  Positive for congestion, sinus pressure and sinus pain.   All other systems reviewed and are negative.    Physical Exam Triage Vital Signs ED Triage Vitals  Encounter Vitals Group     BP 10/20/23 1215 120/75     Systolic BP Percentile --      Diastolic BP Percentile --      Pulse Rate 10/20/23 1215 63     Resp 10/20/23 1215 18     Temp 10/20/23 1215 98.8 F (37.1 C)     Temp Source 10/20/23 1215 Oral     SpO2 10/20/23 1215 96 %     Weight 10/20/23 1217 220 lb (99.8 kg)     Height 10/20/23 1217 5\' 6"  (1.676 m)     Head  Circumference --      Peak Flow --      Pain Score 10/20/23 1217 0     Pain Loc --      Pain Education --      Exclude from Growth Chart --    No data found.  Updated Vital Signs BP 120/75 (BP Location: Right Arm)   Pulse 63   Temp 98.8 F (37.1 C) (Oral)   Resp 18   Ht 5\' 6"  (1.676 m)   Wt 220 lb (99.8 kg)   SpO2 96%   BMI 35.51 kg/m    Physical Exam Vitals and nursing note reviewed.  Constitutional:      Appearance: Normal appearance. She is obese. She is ill-appearing.  HENT:     Head: Normocephalic and atraumatic.     Right Ear: Tympanic membrane and external ear normal.     Left Ear: Tympanic membrane and external ear normal.     Ears:     Comments: Significant eustachian tube dysfunction noted bilaterally    Nose:     Comments: Turbinates are erythematous/edematous    Mouth/Throat:     Mouth: Mucous membranes are moist.     Pharynx: Oropharynx is clear.  Eyes:     Extraocular Movements: Extraocular movements intact.     Conjunctiva/sclera: Conjunctivae normal.     Pupils: Pupils are equal, round, and reactive to light.  Cardiovascular:     Rate and Rhythm: Normal rate and regular rhythm.     Pulses: Normal pulses.     Heart sounds: Normal heart sounds.  Pulmonary:     Effort: Pulmonary effort is normal.     Breath sounds: Normal breath sounds. No wheezing, rhonchi or rales.  Musculoskeletal:        General: Normal range of motion.     Cervical back: Normal range of motion and neck supple.  Skin:    General: Skin is warm and dry.  Neurological:     General: No focal deficit present.     Mental Status: She is alert and oriented to person, place, and time.  Psychiatric:        Mood and Affect: Mood normal.        Behavior: Behavior normal.      UC Treatments / Results  Labs (all labs ordered are listed, but only abnormal results are displayed) Labs Reviewed - No data to display  EKG   Radiology No results found.  Procedures Procedures  (including critical care time)  Medications Ordered in UC Medications - No data to display  Initial Impression / Assessment and Plan / UC Course  I have reviewed the triage vital signs and  the nursing notes.  Pertinent labs & imaging results that were available during my care of the patient were reviewed by me and considered in my medical decision making (see chart for details).     MDM: 1.  Acute maxillary sinusitis, recurrence not specified-Rx'd doxycycline 100 mg capsule: Take 1 capsule twice daily x 7 days; 2.  Nasal sinus congestion-Rx'd prednisone 20 mg tablet: Take 3 tabs p.o. daily x 5 days. Advised patient to take medication as directed with food to completion.  Advised patient to take prednisone with first dose of doxycycline for the next 5 of 7 days.  Encouraged to increase daily water intake to 64 ounces per day while taking these medications.  Advised if symptoms worsen and/or unresolved please follow-up with PCP, ENT, or here for further evaluation. Final Clinical Impressions(s) / UC Diagnoses   Final diagnoses:  Acute maxillary sinusitis, recurrence not specified  Nasal sinus congestion     Discharge Instructions      Advised patient to take medication as directed with food to completion.  Advised patient to take prednisone with first dose of doxycycline for the next 5 of 7 days.  Encouraged to increase daily water intake to 64 ounces per day while taking these medications.  Advised if symptoms worsen and/or unresolved please follow-up with PCP, ENT, or here for further evaluation.     ED Prescriptions     Medication Sig Dispense Auth. Provider   doxycycline (VIBRAMYCIN) 100 MG capsule Take 1 capsule (100 mg total) by mouth 2 (two) times daily for 7 days. 14 capsule Trevor Iha, FNP   predniSONE (DELTASONE) 20 MG tablet Take 3 tabs PO daily x 5 days. 15 tablet Trevor Iha, FNP      PDMP not reviewed this encounter.   Trevor Iha, FNP 10/20/23 1316     Trevor Iha, FNP 10/20/23 1455

## 2023-11-17 ENCOUNTER — Encounter (HOSPITAL_BASED_OUTPATIENT_CLINIC_OR_DEPARTMENT_OTHER): Payer: Self-pay | Admitting: Emergency Medicine

## 2023-11-17 ENCOUNTER — Emergency Department (HOSPITAL_BASED_OUTPATIENT_CLINIC_OR_DEPARTMENT_OTHER): Payer: Managed Care, Other (non HMO)

## 2023-11-17 ENCOUNTER — Other Ambulatory Visit: Payer: Self-pay

## 2023-11-17 ENCOUNTER — Emergency Department (HOSPITAL_BASED_OUTPATIENT_CLINIC_OR_DEPARTMENT_OTHER)
Admission: EM | Admit: 2023-11-17 | Discharge: 2023-11-17 | Disposition: A | Discharge status: Home / Self Care | Payer: Managed Care, Other (non HMO) | Attending: Emergency Medicine | Admitting: Emergency Medicine

## 2023-11-17 DIAGNOSIS — R059 Cough, unspecified: Secondary | ICD-10-CM | POA: Diagnosis not present

## 2023-11-17 DIAGNOSIS — R0789 Other chest pain: Secondary | ICD-10-CM | POA: Insufficient documentation

## 2023-11-17 LAB — BASIC METABOLIC PANEL
Anion gap: 8 (ref 5–15)
BUN: 18 mg/dL (ref 8–23)
CO2: 23 mmol/L (ref 22–32)
Calcium: 9.5 mg/dL (ref 8.9–10.3)
Chloride: 106 mmol/L (ref 98–111)
Creatinine, Ser: 0.74 mg/dL (ref 0.44–1.00)
GFR, Estimated: >60 mL/min (ref >60)
Glucose, Bld: 191 mg/dL — ABNORMAL HIGH (ref 70–99)
Potassium: 4.2 mmol/L (ref 3.5–5.1)
Sodium: 137 mmol/L (ref 135–145)

## 2023-11-17 LAB — RESP PANEL BY RT-PCR (RSV, FLU A&B, COVID)  RVPGX2
Influenza A by PCR: NEGATIVE
Influenza B by PCR: NEGATIVE
Resp Syncytial Virus by PCR: NEGATIVE
SARS Coronavirus 2 by RT PCR: NEGATIVE

## 2023-11-17 LAB — CBC
HCT: 45.8 % (ref 36.0–46.0)
Hemoglobin: 15.1 g/dL — ABNORMAL HIGH (ref 12.0–15.0)
MCH: 29.3 pg (ref 26.0–34.0)
MCHC: 33 g/dL (ref 30.0–36.0)
MCV: 88.9 fL (ref 80.0–100.0)
Platelets: 248 10*3/uL (ref 150–400)
RBC: 5.15 MIL/uL — ABNORMAL HIGH (ref 3.87–5.11)
RDW: 13.8 % (ref 11.5–15.5)
WBC: 11.9 10*3/uL — ABNORMAL HIGH (ref 4.0–10.5)
nRBC: 0 % (ref 0.0–0.2)

## 2023-11-17 LAB — TROPONIN I (HIGH SENSITIVITY): Troponin I (High Sensitivity): 5 ng/L (ref <18)

## 2023-11-17 NOTE — Discharge Instructions (Signed)
 If you develop recurrent, continued, or worsening chest pain, shortness of breath, fever, vomiting, abdominal or back pain, or any other new/concerning symptoms then return to the ER for evaluation.

## 2023-11-17 NOTE — ED Provider Notes (Signed)
[place] [facility]Y DEPARTMENT AT [facility] Provider Note   CSN: [id number] Arrival date & time: 11/17/23  1714     History  Chief Complaint  Patient presents with   Cough   Chest Pain    Amber Cruz is a 68 y.o. female.  HPI 68 year old female presents with chest tightness and cough.  She has been dealing with symptoms since 2/2 when she was diagnosed with influenza.  She got better but her cough never really went away.  Went back to urgent care on 2/17 for continued cough/symptoms and they did an x-ray that was reportedly positive for left-sided pneumonia and put her on levofloxacin.  She was also given promethazine cough medicine.  Went back yesterday as she has continued to have low-grade fevers and symptoms including chest tightness for the last 4 days and they did not repeat an x-ray but changed her antibiotics to doxycycline and cephalexin as well as putting her on prednisone and fluconazole.  Continues to cough though it is nonproductive.  Continues to have constant chest tightness.  She was diagnosed with costochondritis.  No leg swelling.  She and husband reports she became anxious and concerned and wanted to get rechecked and they were hoping for repeat chest x-ray to make sure the pneumonia was not getting worse.  Home Medications Prior to Admission medications   Medication Sig Start Date End Date Taking? Authorizing Provider  DULoxetine (CYMBALTA) 30 MG capsule Take 1 a day 07/31/20   [provider]  fexofenadine (ALLEGRA ALLERGY) 180 MG tablet Take 1 tablet (180 mg total) by mouth daily for 15 days. 04/24/21 05/09/21  Trevor Iha, FNP  levothyroxine (SYNTHROID, LEVOTHROID) 137 MCG tablet Take 137 mcg by mouth daily before breakfast.    [provider]  LORazepam (ATIVAN) 1 MG tablet TAKE ONE (1) TABLET BY MOUTH TWICE DAILY DAY AS NEEDED FOR ANXIETY 11/17/16   [provider]  meclizine (ANTIVERT) 25 MG tablet Take 1 tablet (25 mg  total) by mouth 3 (three) times daily as needed for dizziness. 12/22/20   Lattie Haw, MD  Multiple Vitamin (MULTI-VITAMIN) tablet Take 1 tablet by mouth daily.    [provider]  ondansetron (ZOFRAN-ODT) 4 MG disintegrating tablet Take 1 tablet (4 mg total) by mouth every 8 (eight) hours as needed for nausea or vomiting. Dissolve under tongue. 12/22/20   Lattie Haw, MD  OZEMPIC, 0.25 OR 0.5 MG/DOSE, 2 MG/1.5ML SOPN SMARTSIG:0.4 Milliliter(s) SUB-Q Once a Week 08/05/20   [provider]  predniSONE (DELTASONE) 20 MG tablet Take 3 tabs PO daily x 5 days. 10/20/23   Trevor Iha, FNP      Allergies    Iodinated contrast media, Ambien [zolpidem], Prochlorperazine, Honey bee venom protein [honey bee venom], and Iodides    Review of Systems   Review of Systems  Constitutional:  Positive for fever.  Respiratory:  Positive for cough and chest tightness. Negative for shortness of breath.   Cardiovascular:  Negative for leg swelling.    Physical Exam Updated Vital Signs BP 131/64 (BP Location: Left Arm)   Pulse 72   Temp 99.2 F (37.3 C) (Oral)   Resp 20   Ht 5\' 6"  (1.676 m)   Wt 95.3 kg   SpO2 95%   BMI 33.89 kg/m  Physical Exam Vitals and nursing note reviewed.  Constitutional:      General: She is not in acute distress.    Appearance: She is well-developed. She is not ill-appearing  or diaphoretic.  HENT:     Head: Normocephalic and atraumatic.  Cardiovascular:     Rate and Rhythm: Normal rate and regular rhythm.     Heart sounds: Normal heart sounds.  Pulmonary:     Effort: Pulmonary effort is normal.     Breath sounds: Normal breath sounds. No wheezing, rhonchi or rales.  Abdominal:     Palpations: Abdomen is soft.     Tenderness: There is no abdominal tenderness.  Musculoskeletal:     Right lower leg: No edema.     Left lower leg: No edema.  Skin:    General: Skin is warm and dry.  Neurological:     Mental Status: She is alert.     ED  Results / Procedures / Treatments   Labs (all labs ordered are listed, but only abnormal results are displayed) Labs Reviewed  BASIC METABOLIC PANEL - Abnormal; Notable for the following components:      Result Value   Glucose, Bld 191 (*)    All other components within normal limits  CBC - Abnormal; Notable for the following components:   WBC 11.9 (*)    RBC 5.15 (*)    Hemoglobin 15.1 (*)    All other components within normal limits  RESP PANEL BY RT-PCR (RSV, FLU A&B, COVID)  RVPGX2  TROPONIN I (HIGH SENSITIVITY)    EKG EKG Interpretation Date/Time:  Friday November 17 2023 17:25:39 EST Ventricular Rate:  67 PR Interval:  182 QRS Duration:  92 QT Interval:  405 QTC Calculation: 428 R Axis:   -66  Text Interpretation: Sinus rhythm Left anterior fascicular block Low voltage, precordial leads RSR' in V1 or V2, right VCD or RVH no acute ST/T changes Confirmed by Pricilla Loveless [phone]) on 11/17/2023 6:27:24 PM  Radiology DG Chest 2 View Result Date: 11/17/2023 CLINICAL DATA:  Short of breath chest tightness EXAM: CHEST - 2 VIEW COMPARISON:  Radiograph 07/10/2018 FINDINGS: Normal cardiac silhouette. LEFT basilar atelectasis similar prior. No effusion, infiltrate, or pneumothorax. IMPRESSION: LEFT basilar atelectasis.  No interval change.  No pneumonia. Electronically Signed   By: Genevive Bi M.D.   On: 11/17/2023 18:30    Procedures Procedures    Medications Ordered in ED Medications - No data to display  ED Course/ Medical Decision Making/ A&P                                 Medical Decision Making Amount and/or Complexity of Data Reviewed Independent Historian: spouse External Data Reviewed: notes. Labs: ordered.    Details: Mild leukocytosis, could be from infection versus steroids. Radiology: ordered and independent interpretation performed.    Details: No lobar pneumonia ECG/medicine tests: ordered and independent interpretation performed.    Details: No  ischemia   Patient's chest pain is probably from the prolonged coughing for the past couple weeks.  Troponin is negative and no acute ischemia on ECG.  I did discuss we could consider PE though that would be atypical and this sounds more infectious.  She has declined further workup after the initial triage workup including D-dimer or CTA.  I think this is pretty reasonable as I do think this is unlikely.  Mostly we discussed about the reassuring workup including no significant pneumonia on x-ray, normal troponin, etc.  Lungs are clear currently.  She feels well enough for discharge and to continue the treatment prescribed to her yesterday.  Will advise follow-up  with PCP and will give return precautions but she otherwise appears well and has stable vitals.        Final Clinical Impression(s) / ED Diagnoses Final diagnoses:  Chest wall pain    Rx / DC Orders ED Discharge Orders     None         Pricilla Loveless, MD 11/17/23 1859

## 2023-11-17 NOTE — ED Triage Notes (Signed)
Pt POV shuffling gait- c/o cough, chest tightness, recently dx with pneumonia yesterday.  C/o worsening chest tightness.   Dx with FLU A beginning of Feb, pt reports fever today.

## 2023-12-01 ENCOUNTER — Encounter (HOSPITAL_COMMUNITY): Payer: Self-pay

## 2023-12-01 ENCOUNTER — Inpatient Hospital Stay (HOSPITAL_COMMUNITY)

## 2023-12-01 ENCOUNTER — Inpatient Hospital Stay (HOSPITAL_COMMUNITY)
Admission: EM | Admit: 2023-12-01 | Discharge: 2023-12-08 | DRG: 917 | Disposition: A | Discharge status: Other Acute Inpatient Hospital | Attending: Internal Medicine | Admitting: Internal Medicine

## 2023-12-01 ENCOUNTER — Other Ambulatory Visit: Payer: Self-pay

## 2023-12-01 ENCOUNTER — Emergency Department (HOSPITAL_COMMUNITY)

## 2023-12-01 DIAGNOSIS — F411 Generalized anxiety disorder: Secondary | ICD-10-CM | POA: Diagnosis present

## 2023-12-01 DIAGNOSIS — E872 Acidosis, unspecified: Secondary | ICD-10-CM | POA: Diagnosis not present

## 2023-12-01 DIAGNOSIS — Z820 Family history of epilepsy and other diseases of the nervous system: Secondary | ICD-10-CM

## 2023-12-01 DIAGNOSIS — G8929 Other chronic pain: Secondary | ICD-10-CM | POA: Diagnosis present

## 2023-12-01 DIAGNOSIS — T447X2A Poisoning by beta-adrenoreceptor antagonists, intentional self-harm, initial encounter: Principal | ICD-10-CM | POA: Diagnosis present

## 2023-12-01 DIAGNOSIS — G9341 Metabolic encephalopathy: Secondary | ICD-10-CM

## 2023-12-01 DIAGNOSIS — F05 Delirium due to known physiological condition: Secondary | ICD-10-CM | POA: Diagnosis not present

## 2023-12-01 DIAGNOSIS — Z66 Do not resuscitate: Secondary | ICD-10-CM | POA: Diagnosis present

## 2023-12-01 DIAGNOSIS — Z95820 Peripheral vascular angioplasty status with implants and grafts: Secondary | ICD-10-CM

## 2023-12-01 DIAGNOSIS — Z7289 Other problems related to lifestyle: Secondary | ICD-10-CM

## 2023-12-01 DIAGNOSIS — G4451 Hemicrania continua: Secondary | ICD-10-CM | POA: Diagnosis present

## 2023-12-01 DIAGNOSIS — T50902A Poisoning by unspecified drugs, medicaments and biological substances, intentional self-harm, initial encounter: Secondary | ICD-10-CM | POA: Diagnosis not present

## 2023-12-01 DIAGNOSIS — Z9152 Personal history of nonsuicidal self-harm: Secondary | ICD-10-CM

## 2023-12-01 DIAGNOSIS — R570 Cardiogenic shock: Secondary | ICD-10-CM | POA: Diagnosis present

## 2023-12-01 DIAGNOSIS — Z9151 Personal history of suicidal behavior: Secondary | ICD-10-CM | POA: Diagnosis not present

## 2023-12-01 DIAGNOSIS — Z781 Physical restraint status: Secondary | ICD-10-CM | POA: Diagnosis not present

## 2023-12-01 DIAGNOSIS — R001 Bradycardia, unspecified: Secondary | ICD-10-CM | POA: Diagnosis present

## 2023-12-01 DIAGNOSIS — D6800 Von Willebrand disease, unspecified: Secondary | ICD-10-CM | POA: Diagnosis present

## 2023-12-01 DIAGNOSIS — Z79890 Hormone replacement therapy (postmenopausal): Secondary | ICD-10-CM

## 2023-12-01 DIAGNOSIS — E876 Hypokalemia: Secondary | ICD-10-CM | POA: Diagnosis present

## 2023-12-01 DIAGNOSIS — Z79899 Other long term (current) drug therapy: Secondary | ICD-10-CM

## 2023-12-01 DIAGNOSIS — F332 Major depressive disorder, recurrent severe without psychotic features: Secondary | ICD-10-CM

## 2023-12-01 DIAGNOSIS — T424X2A Poisoning by benzodiazepines, intentional self-harm, initial encounter: Secondary | ICD-10-CM | POA: Diagnosis present

## 2023-12-01 DIAGNOSIS — G928 Other toxic encephalopathy: Secondary | ICD-10-CM | POA: Diagnosis present

## 2023-12-01 DIAGNOSIS — F03918 Unspecified dementia, unspecified severity, with other behavioral disturbance: Secondary | ICD-10-CM | POA: Diagnosis present

## 2023-12-01 DIAGNOSIS — T402X2A Poisoning by other opioids, intentional self-harm, initial encounter: Secondary | ICD-10-CM | POA: Diagnosis present

## 2023-12-01 DIAGNOSIS — F0393 Unspecified dementia, unspecified severity, with mood disturbance: Secondary | ICD-10-CM | POA: Diagnosis present

## 2023-12-01 DIAGNOSIS — F339 Major depressive disorder, recurrent, unspecified: Secondary | ICD-10-CM

## 2023-12-01 DIAGNOSIS — R579 Shock, unspecified: Secondary | ICD-10-CM | POA: Diagnosis not present

## 2023-12-01 DIAGNOSIS — F0394 Unspecified dementia, unspecified severity, with anxiety: Secondary | ICD-10-CM | POA: Diagnosis present

## 2023-12-01 DIAGNOSIS — J9601 Acute respiratory failure with hypoxia: Secondary | ICD-10-CM | POA: Diagnosis present

## 2023-12-01 DIAGNOSIS — R4182 Altered mental status, unspecified: Secondary | ICD-10-CM | POA: Diagnosis present

## 2023-12-01 DIAGNOSIS — R253 Fasciculation: Secondary | ICD-10-CM | POA: Diagnosis present

## 2023-12-01 DIAGNOSIS — E871 Hypo-osmolality and hyponatremia: Secondary | ICD-10-CM | POA: Diagnosis not present

## 2023-12-01 DIAGNOSIS — T50904A Poisoning by unspecified drugs, medicaments and biological substances, undetermined, initial encounter: Secondary | ICD-10-CM

## 2023-12-01 DIAGNOSIS — E162 Hypoglycemia, unspecified: Secondary | ICD-10-CM | POA: Diagnosis not present

## 2023-12-01 DIAGNOSIS — Z630 Problems in relationship with spouse or partner: Secondary | ICD-10-CM | POA: Diagnosis not present

## 2023-12-01 DIAGNOSIS — T50901A Poisoning by unspecified drugs, medicaments and biological substances, accidental (unintentional), initial encounter: Secondary | ICD-10-CM | POA: Diagnosis present

## 2023-12-01 DIAGNOSIS — R4189 Other symptoms and signs involving cognitive functions and awareness: Principal | ICD-10-CM

## 2023-12-01 DIAGNOSIS — Z8249 Family history of ischemic heart disease and other diseases of the circulatory system: Secondary | ICD-10-CM

## 2023-12-01 DIAGNOSIS — Z91041 Radiographic dye allergy status: Secondary | ICD-10-CM

## 2023-12-01 DIAGNOSIS — E039 Hypothyroidism, unspecified: Secondary | ICD-10-CM | POA: Diagnosis present

## 2023-12-01 DIAGNOSIS — Z6838 Body mass index (BMI) 38.0-38.9, adult: Secondary | ICD-10-CM

## 2023-12-01 DIAGNOSIS — E66812 Obesity, class 2: Secondary | ICD-10-CM | POA: Diagnosis present

## 2023-12-01 DIAGNOSIS — Z888 Allergy status to other drugs, medicaments and biological substances status: Secondary | ICD-10-CM

## 2023-12-01 DIAGNOSIS — Z91148 Patient's other noncompliance with medication regimen for other reason: Secondary | ICD-10-CM

## 2023-12-01 DIAGNOSIS — E119 Type 2 diabetes mellitus without complications: Secondary | ICD-10-CM | POA: Diagnosis not present

## 2023-12-01 DIAGNOSIS — R451 Restlessness and agitation: Secondary | ICD-10-CM | POA: Diagnosis not present

## 2023-12-01 DIAGNOSIS — R9431 Abnormal electrocardiogram [ECG] [EKG]: Secondary | ICD-10-CM | POA: Diagnosis not present

## 2023-12-01 DIAGNOSIS — Z8679 Personal history of other diseases of the circulatory system: Secondary | ICD-10-CM

## 2023-12-01 DIAGNOSIS — Z23 Encounter for immunization: Secondary | ICD-10-CM | POA: Diagnosis not present

## 2023-12-01 DIAGNOSIS — Z7984 Long term (current) use of oral hypoglycemic drugs: Secondary | ICD-10-CM | POA: Diagnosis not present

## 2023-12-01 DIAGNOSIS — E8770 Fluid overload, unspecified: Secondary | ICD-10-CM | POA: Diagnosis not present

## 2023-12-01 LAB — COMPREHENSIVE METABOLIC PANEL
ALT: 16 U/L (ref 0–44)
AST: 22 U/L (ref 15–41)
Albumin: 3.1 g/dL — ABNORMAL LOW (ref 3.5–5.0)
Alkaline Phosphatase: 33 U/L — ABNORMAL LOW (ref 38–126)
Anion gap: 8 (ref 5–15)
BUN: 14 mg/dL (ref 8–23)
CO2: 25 mmol/L (ref 22–32)
Calcium: 8.7 mg/dL — ABNORMAL LOW (ref 8.9–10.3)
Chloride: 104 mmol/L (ref 98–111)
Creatinine, Ser: 0.95 mg/dL (ref 0.44–1.00)
GFR, Estimated: >60 mL/min (ref >60)
Glucose, Bld: 154 mg/dL — ABNORMAL HIGH (ref 70–99)
Potassium: 4.3 mmol/L (ref 3.5–5.1)
Sodium: 137 mmol/L (ref 135–145)
Total Bilirubin: 0.8 mg/dL (ref 0.0–1.2)
Total Protein: 5.3 g/dL — ABNORMAL LOW (ref 6.5–8.1)

## 2023-12-01 LAB — CBG MONITORING, ED: Glucose-Capillary: 127 mg/dL — ABNORMAL HIGH (ref 70–99)

## 2023-12-01 LAB — ETHANOL: Alcohol, Ethyl (B): <10 mg/dL (ref <10)

## 2023-12-01 LAB — CBC WITH DIFFERENTIAL/PLATELET
Abs Immature Granulocytes: 0.02 10*3/uL (ref 0.00–0.07)
Basophils Absolute: 0 10*3/uL (ref 0.0–0.1)
Basophils Relative: 0 %
Eosinophils Absolute: 0.1 10*3/uL (ref 0.0–0.5)
Eosinophils Relative: 1 %
HCT: 38.5 % (ref 36.0–46.0)
Hemoglobin: 12.3 g/dL (ref 12.0–15.0)
Immature Granulocytes: 0 %
Lymphocytes Relative: 15 %
Lymphs Abs: 1.2 10*3/uL (ref 0.7–4.0)
MCH: 29.6 pg (ref 26.0–34.0)
MCHC: 31.9 g/dL (ref 30.0–36.0)
MCV: 92.5 fL (ref 80.0–100.0)
Monocytes Absolute: 0.5 10*3/uL (ref 0.1–1.0)
Monocytes Relative: 6 %
Neutro Abs: 6.5 10*3/uL (ref 1.7–7.7)
Neutrophils Relative %: 78 %
Platelets: 178 10*3/uL (ref 150–400)
RBC: 4.16 MIL/uL (ref 3.87–5.11)
RDW: 14.1 % (ref 11.5–15.5)
WBC: 8.3 10*3/uL (ref 4.0–10.5)
nRBC: 0 % (ref 0.0–0.2)

## 2023-12-01 LAB — GLUCOSE, CAPILLARY
Glucose-Capillary: 148 mg/dL — ABNORMAL HIGH (ref 70–99)
Glucose-Capillary: 170 mg/dL — ABNORMAL HIGH (ref 70–99)
Glucose-Capillary: 211 mg/dL — ABNORMAL HIGH (ref 70–99)
Glucose-Capillary: 221 mg/dL — ABNORMAL HIGH (ref 70–99)
Glucose-Capillary: 238 mg/dL — ABNORMAL HIGH (ref 70–99)
Glucose-Capillary: 259 mg/dL — ABNORMAL HIGH (ref 70–99)

## 2023-12-01 LAB — I-STAT ARTERIAL BLOOD GAS, ED
Acid-base deficit: 2 mmol/L (ref 0.0–2.0)
Bicarbonate: 24.9 mmol/L (ref 20.0–28.0)
Calcium, Ion: 1.33 mmol/L (ref 1.15–1.40)
HCT: 40 % (ref 36.0–46.0)
Hemoglobin: 13.6 g/dL (ref 12.0–15.0)
O2 Saturation: 99 %
Patient temperature: 98.3
Potassium: 3.8 mmol/L (ref 3.5–5.1)
Sodium: 138 mmol/L (ref 135–145)
TCO2: 26 mmol/L (ref 22–32)
pCO2 arterial: 48.2 mmHg — ABNORMAL HIGH (ref 32–48)
pH, Arterial: 7.32 — ABNORMAL LOW (ref 7.35–7.45)
pO2, Arterial: 151 mmHg — ABNORMAL HIGH (ref 83–108)

## 2023-12-01 LAB — I-STAT CG4 LACTIC ACID, ED: Lactic Acid, Venous: 0.7 mmol/L (ref 0.5–1.9)

## 2023-12-01 LAB — RAPID URINE DRUG SCREEN, HOSP PERFORMED
Amphetamines: NOT DETECTED
Barbiturates: NOT DETECTED
Benzodiazepines: POSITIVE — AB
Cocaine: NOT DETECTED
Opiates: POSITIVE — AB
Tetrahydrocannabinol: NOT DETECTED

## 2023-12-01 LAB — MRSA NEXT GEN BY PCR, NASAL: MRSA by PCR Next Gen: NOT DETECTED

## 2023-12-01 LAB — PHOSPHORUS: Phosphorus: 2.9 mg/dL (ref 2.5–4.6)

## 2023-12-01 LAB — ACETAMINOPHEN LEVEL: Acetaminophen (Tylenol), Serum: 21 ug/mL (ref 10–30)

## 2023-12-01 LAB — CK: Total CK: 81 U/L (ref 38–234)

## 2023-12-01 LAB — MAGNESIUM: Magnesium: 2.2 mg/dL (ref 1.7–2.4)

## 2023-12-01 LAB — SALICYLATE LEVEL: Salicylate Lvl: <7 mg/dL — ABNORMAL LOW (ref 7.0–30.0)

## 2023-12-01 MED ORDER — DEXTROSE 50 % IV SOLN
INTRAVENOUS | Status: AC
  Filled 2023-12-01: qty 100

## 2023-12-01 MED ORDER — DEXTROSE 50 % IV SOLN
0.5000 g/kg | Freq: Once | INTRAVENOUS | Status: AC | PRN
  Administered 2023-12-01: 47.65 g via INTRAVENOUS
  Filled 2023-12-01: qty 50

## 2023-12-01 MED ORDER — INSULIN HIGH DOSE BOLUS VIA INFUSION (FOR BETA BLOCKER / CALCIUM CHANNEL BLOCKER OVERDOSE)
1.0000 [IU]/kg | Freq: Once | INTRAVENOUS | Status: AC
  Administered 2023-12-01: 95.3 [IU] via INTRAVENOUS
  Filled 2023-12-01: qty 96

## 2023-12-01 MED ORDER — CALCIUM GLUCONATE 10 % IV SOLN
1.0000 g | Freq: Once | INTRAVENOUS | Status: AC
Start: 2023-12-01 — End: 2023-12-01
  Administered 2023-12-01: 1 g via INTRAVENOUS
  Filled 2023-12-01: qty 10

## 2023-12-01 MED ORDER — ATROPINE SULFATE 1 MG/10ML IJ SOSY
PREFILLED_SYRINGE | INTRAMUSCULAR | Status: AC
  Filled 2023-12-01: qty 10

## 2023-12-01 MED ORDER — ATROPINE SULFATE 1 MG/ML IV SOLN
1.0000 mg | Freq: Once | INTRAVENOUS | Status: AC
  Administered 2023-12-01: 1 mg via INTRAVENOUS
  Filled 2023-12-01: qty 1

## 2023-12-01 MED ORDER — PROPOFOL 1000 MG/100ML IV EMUL: 5.0000 ug/kg/min | INTRAVENOUS | Status: DC

## 2023-12-01 MED ORDER — DOCUSATE SODIUM 100 MG PO CAPS: 100.0000 mg | ORAL_CAPSULE | Freq: Two times a day (BID) | ORAL | Status: DC | PRN

## 2023-12-01 MED ORDER — CALCIUM GLUCONATE 10 % IV SOLN
1.0000 g | Freq: Once | INTRAVENOUS | Status: AC
  Administered 2023-12-01: 1 g via INTRAVENOUS

## 2023-12-01 MED ORDER — FENTANYL CITRATE PF 50 MCG/ML IJ SOSY: 25.0000 ug | PREFILLED_SYRINGE | INTRAMUSCULAR | Status: DC | PRN

## 2023-12-01 MED ORDER — GLUCAGON HCL RDNA (DIAGNOSTIC) 1 MG IJ SOLR
3.0000 mg | Freq: Once | INTRAVENOUS | Status: AC
  Administered 2023-12-01: 3 mg via INTRAVENOUS
  Filled 2023-12-01: qty 3

## 2023-12-01 MED ORDER — SODIUM CHLORIDE 0.9% FLUSH
10.0000 mL | Freq: Two times a day (BID) | INTRAVENOUS | Status: DC
  Administered 2023-12-01 – 2023-12-02 (×3): 10 mL
  Administered 2023-12-03: 30 mL
  Administered 2023-12-03: 10 mL
  Administered 2023-12-04: 20 mL
  Administered 2023-12-04 – 2023-12-08 (×7): 10 mL

## 2023-12-01 MED ORDER — CHLORHEXIDINE GLUCONATE CLOTH 2 % EX PADS
6.0000 | MEDICATED_PAD | Freq: Every day | CUTANEOUS | Status: DC
  Administered 2023-12-01 – 2023-12-08 (×8): 6 via TOPICAL

## 2023-12-01 MED ORDER — SODIUM CHLORIDE 0.9 % IV SOLN: INTRAVENOUS | Status: AC

## 2023-12-01 MED ORDER — INSULIN ASPART 100 UNIT/ML IJ SOLN
0.0000 [IU] | INTRAMUSCULAR | Status: DC
  Administered 2023-12-01: 2 [IU] via SUBCUTANEOUS

## 2023-12-01 MED ORDER — FENTANYL CITRATE PF 50 MCG/ML IJ SOSY
25.0000 ug | PREFILLED_SYRINGE | INTRAMUSCULAR | Status: DC | PRN
  Administered 2023-12-01 – 2023-12-02 (×8): 50 ug via INTRAVENOUS
  Filled 2023-12-01 (×2): qty 1
  Filled 2023-12-01: qty 2
  Filled 2023-12-01 (×6): qty 1

## 2023-12-01 MED ORDER — CALCIUM CHLORIDE 10 % IV SOLN: 1.0000 g | Freq: Once | INTRAVENOUS | Status: DC

## 2023-12-01 MED ORDER — POLYETHYLENE GLYCOL 3350 17 G PO PACK: 17.0000 g | PACK | Freq: Every day | ORAL | Status: DC | PRN

## 2023-12-01 MED ORDER — LACTATED RINGERS IV BOLUS
1000.0000 mL | Freq: Once | INTRAVENOUS | Status: AC
  Administered 2023-12-01: 1000 mL via INTRAVENOUS

## 2023-12-01 MED ORDER — EPINEPHRINE HCL 5 MG/250ML IV SOLN IN NS
0.5000 ug/min | INTRAVENOUS | Status: DC
  Administered 2023-12-01: 2 ug/min via INTRAVENOUS
  Administered 2023-12-02: 21 ug/min via INTRAVENOUS
  Administered 2023-12-02: 19 ug/min via INTRAVENOUS
  Administered 2023-12-02: 17 ug/min via INTRAVENOUS
  Administered 2023-12-03: 9.5 ug/min via INTRAVENOUS
  Administered 2023-12-03: 2.5 ug/min via INTRAVENOUS
  Filled 2023-12-01 (×7): qty 250

## 2023-12-01 MED ORDER — DOCUSATE SODIUM 50 MG/5ML PO LIQD: 100.0000 mg | Freq: Two times a day (BID) | ORAL | Status: DC | PRN

## 2023-12-01 MED ORDER — DEXTROSE 10% IV SOLUTION (ML/KG/HR)
7.0000 mL/kg/h | INTRAVENOUS | Status: DC
Start: 2023-12-01 — End: 2023-12-02
  Administered 2023-12-01: 5 mL/kg/h via INTRAVENOUS
  Administered 2023-12-02 (×3): 7 mL/kg/h via INTRAVENOUS
  Administered 2023-12-02: 6 mL/kg/h via INTRAVENOUS
  Administered 2023-12-02 (×9): 7 mL/kg/h via INTRAVENOUS
  Administered 2023-12-02: 5 mL/kg/h via INTRAVENOUS
  Administered 2023-12-02: 7 mL/kg/h via INTRAVENOUS
  Filled 2023-12-01 (×10): qty 1000

## 2023-12-01 MED ORDER — PROPOFOL 1000 MG/100ML IV EMUL
0.0000 ug/kg/min | INTRAVENOUS | Status: DC
  Administered 2023-12-01: 10 ug/kg/min via INTRAVENOUS
  Filled 2023-12-01: qty 100

## 2023-12-01 MED ORDER — ETOMIDATE 2 MG/ML IV SOLN
INTRAVENOUS | Status: DC | PRN
  Administered 2023-12-01: 10 mg via INTRAVENOUS

## 2023-12-01 MED ORDER — SODIUM CHLORIDE 0.9% FLUSH: 10.0000 mL | INTRAVENOUS | Status: DC | PRN

## 2023-12-01 MED ORDER — DEXTROSE 50 % IV SOLN
INTRAVENOUS | Status: AC
  Filled 2023-12-01: qty 50

## 2023-12-01 MED ORDER — INSULIN REGULAR HUMAN 100 UNIT/ML IJ SOLN
2.0000 [IU]/kg/h | INTRAMUSCULAR | Status: DC
  Administered 2023-12-01: 1 [IU]/kg/h via INTRAVENOUS
  Administered 2023-12-02 (×4): 2 [IU]/kg/h via INTRAVENOUS
  Filled 2023-12-01 (×8): qty 10

## 2023-12-01 MED ORDER — PANTOPRAZOLE SODIUM 40 MG IV SOLR
40.0000 mg | Freq: Every day | INTRAVENOUS | Status: DC
Start: 2023-12-01 — End: 2023-12-02
  Administered 2023-12-01: 40 mg via INTRAVENOUS
  Filled 2023-12-01: qty 10

## 2023-12-01 MED ORDER — SODIUM CHLORIDE 0.9 % IV SOLN
3.0000 g | Freq: Four times a day (QID) | INTRAVENOUS | Status: DC
  Administered 2023-12-01 – 2023-12-02 (×3): 3 g via INTRAVENOUS
  Filled 2023-12-01 (×3): qty 8

## 2023-12-01 MED ORDER — SUCCINYLCHOLINE CHLORIDE 20 MG/ML IJ SOLN
INTRAMUSCULAR | Status: AC | PRN
  Administered 2023-12-01: 140 mg via INTRAVENOUS

## 2023-12-01 MED ORDER — HEPARIN SODIUM (PORCINE) 5000 UNIT/ML IJ SOLN
5000.0000 [IU] | Freq: Three times a day (TID) | INTRAMUSCULAR | Status: DC
  Administered 2023-12-01 – 2023-12-08 (×20): 5000 [IU] via SUBCUTANEOUS
  Filled 2023-12-01 (×20): qty 1

## 2023-12-01 NOTE — ED Notes (Signed)
 Admitting at bedside
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
## 2023-12-01 NOTE — Progress Notes (Signed)
 Have d/w pharmD re contents of her Rx ativan bottle -- small round purple pill with a line one side of the pill that does not extend the full diameter of the pill.   On a quick search I could not ID this pill. Pharm D has spent a lot of time searching as well and cannot identify this pill.   Cont supportive care as outlined in H&P, remains unclear what all the pt has ingested    Tessie Fass MSN, AGACNP-BC [facility] Pulmonary/Critical Care Medicine 12/01/2023, 6:31 PM
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
## 2023-12-01 NOTE — Progress Notes (Signed)
 Pharmacy Antibiotic Note  Amber Cruz is a 68 y.o. female admitted on 12/01/2023 with possible aspiration pneumonia.  Pharmacy has been consulted for ampicillin/sulbactam  dosing.  Plan: ampicillin/sulbactam 3g q6hr Monitor cultures, clinical status, renal function Narrow abx as able and f/u duration     Temp (24hrs), Avg:96 F (35.6 C), Min:96 F (35.6 C), Max:96 F (35.6 C)  Recent Labs  Lab 12/01/23 1513 12/01/23 1700  WBC 8.3  --   CREATININE 0.95  --   LATICACIDVEN  --  0.7    CrCl cannot be calculated (Unknown ideal weight.).    Allergies  Allergen Reactions   Iodinated Contrast Media Itching and Rash    CT scan done w/ IV dye on 06/08/12. Per pt / [facility] pt needs to be pre-medicated if needs future CT scans w/IV contrast.  CT scan done w/ IV dye on 06/08/12. Per pt / [facility] pt needs to be pre-medicated if needs future CT scans w/IV contrast.     Ambien [Zolpidem] Other (See Comments)    Drove vehicle without knowledge of driving. From [place], [place]   Prochlorperazine Other (See Comments)   Honey Bee Venom Protein [Honey Bee Venom]    Iodides     Antimicrobials this admission: ampsulb 3/7 >>    Microbiology results: 3/7 BCx:    Thank you for allowing pharmacy to be a part of this patient's care.  Alphia Moh, PharmD, BCPS, BCCP Clinical Pharmacist  Please check AMION for all [facility] phone numbers After 10:00 PM, call Main Pharmacy [phone]

## 2023-12-01 NOTE — ED Triage Notes (Signed)
 Pt BIB [facility] from home d/t being found down by PD after a welfare check was done when her husband could not get in touch with her. Unknown downtime husband reports sometime around 1100 LKW. PD arrived & she was unconscious with multiple pill bottle around her. Consisting of Propanolol, Hydrocodone/Tylenol, Lorazepam, promAce (animal tranquilizer, per EMS). EMS reports she was mildly responsive to painful stimuli, cap of 30, 100% on 3L, pulse ranging from 44-55 bpm, BP 76 palp. Did receive 1 Narcan & 1 Glucagon in 18g Lt AC PIV with 1L NS.

## 2023-12-01 NOTE — Progress Notes (Addendum)
 eLink Physician-Brief Progress Note Patient Name: Amber Cruz DOB: April 25, 1956 MRN: [id number]   Date of Service  12/01/2023  HPI/Events of Note  Pt persistently bradycardic, with HR in the 30s.  Epinephrine has been increased to 63mcg/min - BP had increased, but HR 38 at best.    eICU Interventions  Will start trial of continuous IV insulin+dextrose.  Will monitor response.         [name] 12/01/2023, 9:21 PM  1:30 AM HR up to low to mid 40s when asleep.  Insulin now at 2units/kg/hr, increased D10 to 36ml/kg/hr given downtrending glucose.  Also went up ceiling dose of epinephrine to 3mcg/min.  Will titrate epinephrine as appripriate.

## 2023-12-01 NOTE — ED Notes (Signed)
 X-ray at bedside

## 2023-12-01 NOTE — ED Notes (Signed)
 EDP at bedside inserting Central line.

## 2023-12-01 NOTE — H&P (Signed)
 NAME:  Amber Cruz, MRN:  [id number], DOB:  11-15-1955, LOS: 0 ADMISSION DATE:  12/01/2023, CONSULTATION DATE:  12/01/23 REFERRING MD:  Eloise Harman , CHIEF COMPLAINT:  AMS   History of Present Illness:  68 yo F PMH MDD, Anxiety, memory issues (family is describing what sounds like possible dementia) VWB disease, chronic pain // hemicrania continua who was BIB EMS 3/7 with decr LOC. Last seen this morning around 7. Family members had tried calling/texting from about 0830 onward and she did not answer/reply, which was unusual, prompting police welfare check. She was found w GCS 3, HR 40s, SBP 70s, and received narcan and glucagon without great improvement. Reportedly some pink pills on the ground at home and it is unclear what these are, and was also found with bottle of hydrocodone, labetalol, and bottle labeled lorazepam (though contents may not be lorazepam) as well as bottle ace Ace (Rx to dog, later it was determined that she did not take any of these). Per husband it sounds like she may be swapping meds with a friend, has been taking Zoloft the last few nights which sounds like her friend's Rx   In ED pt was intubated and started on propofol, received succ. She received atropine followed by epi gtt.    Poison control was engaged and rec labs, VS optimization, serial EKG    PCCM is called for admission in this setting    Pertinent  Medical History   MDD Anxiety VWB dz  Significant Hospital Events: Including procedures, antibiotic start and stop dates in addition to other pertinent events   3/7 admit to ICU after OD   Interim History / Subjective:  HR 50s SBP >110s   Objective   Blood pressure 128/69, pulse (!) 54, temperature (!) 96 F (35.6 C), temperature source Temporal, resp. rate 18, SpO2 100%.    Vent Mode: PRVC FiO2 (%):  [40 %-80 %] 40 % Set Rate:  [15 bmp-18 bmp] 18 bmp Vt Set:  [480 mL] 480 mL PEEP:  [5 cmH20] 5 cmH20 Plateau Pressure:  [16 cmH20] 16 cmH20  No intake  or output data in the 24 hours ending 12/01/23 1824 There were no vitals filed for this visit.  Examination: General: critically ill obese F intubated sedated  HENT: NCAT ETT secure anicteric sclera  Lungs:  Bilateral rhonchi. Symmetrical chest expansion, mechanically vent Cardiovascular: brady, regular. Cap refill is >4 seconds  Abdomen: obese soft  Extremities: No acute joint deformity.  Neuro: Pupils are 3mm on prop.With sedation turned off she has some extremity fasciculations. She does not awaken or follow commands  GU: foley  Resolved Hospital Problem list     Assessment & Plan:   Acute toxic metabolic encephalopathy Overdose, presumed intentional  MDD with SI  Anxiety Memory disorder -unclear what exactly she took -- contents of the BZD bottle do not seem to match. Do think BB OD is likely. Did not take Acepromazine ("ace" -- animal tranq. Husband can confirm pill count is correct, Rx to dog)  -APAP is slightly elevated (found w bottle of hydrocodone/tylenol) but in d/w poison control not at a level where we would have to move forward w NAC given the actual level + timeline + benign LFTs  P -admit to ICU -CT H -epi for HR/BP -repeat EKG  -serial BMP -if becomes refractory to epi, would consider insulin  -stop continuous sedation. There is PRN fent ordered for RASS 0 (really, 0 to +1 would be fine), but the aim here  is to wake up, please leave the propofol discontinued unless clinically it truly becomes indicated. Even then, might consider more robust fent coverage as generally more hemodynamically tolerated  -UDS -psych when appropriate   Cardiogenic shock -BB overdose. Did get 1g cal glu x2 in ED, as well as atropine before starting epi P -epi -giving a few L of fluid then mIVF x 1 day -if hemodynamics decline, would get coox, start insulin.  -checking LA -- might be helpful for trying to get a picture for how long she was hypoperfused   Acute resp failure w  hypoxia Aspiration PNA P -cont MV support -VAP, pulm hygiene -unasyn   -PRN CXR ABG  -when appropriate WUA/SBT   Fasciculations -probably succ related and in that case would expect these to be self limited  -cont to monitor, with unclear timeline of hypoperfusion always raises question of possible anoxic/hypoperfusion injury, sz  -if ongoing, get eeg   Hypothyroidism -get a med rec to confirm her home dose -restart synthroid when this is clarified   DNR -I spoke with husband and stepdaughter at bedside, both have stated that she is a DNR.  -entered in our system as "DNR, pre-arrest interventions desired"  Best Practice (right click and "Reselect all SmartList Selections" daily)   Diet/type: NPO DVT prophylaxis prophylactic heparin  Pressure ulcer(s): pressure ulcer assessment deferred  GI prophylaxis: PPI Lines: Central line Foley:  Yes, and it is still needed Code Status:  DNR Last date of multidisciplinary goals of care discussion [12/01/23]  Labs   CBC: Recent Labs  Lab 12/01/23 1513 12/01/23 1532  WBC 8.3  --   NEUTROABS 6.5  --   HGB 12.3 13.6  HCT 38.5 40.0  MCV 92.5  --   PLT 178  --     Basic Metabolic Panel: Recent Labs  Lab 12/01/23 1513 12/01/23 1532  NA 137 138  K 4.3 3.8  CL 104  --   CO2 25  --   GLUCOSE 154*  --   BUN 14  --   CREATININE 0.95  --   CALCIUM 8.7*  --    GFR: CrCl cannot be calculated (Unknown ideal weight.). Recent Labs  Lab 12/01/23 1513 12/01/23 1700  WBC 8.3  --   LATICACIDVEN  --  0.7    Liver Function Tests: Recent Labs  Lab 12/01/23 1513  AST 22  ALT 16  ALKPHOS 33*  BILITOT 0.8  PROT 5.3*  ALBUMIN 3.1*   No results for input(s): "LIPASE", "AMYLASE" in the last 168 hours. No results for input(s): "AMMONIA" in the last 168 hours.  ABG    Component Value Date/Time   PHART 7.320 (L) 12/01/2023 1532   PCO2ART 48.2 (H) 12/01/2023 1532   PO2ART 151 (H) 12/01/2023 1532   HCO3 24.9 12/01/2023 1532    TCO2 26 12/01/2023 1532   ACIDBASEDEF 2.0 12/01/2023 1532   O2SAT 99 12/01/2023 1532     Coagulation Profile: No results for input(s): "INR", "PROTIME" in the last 168 hours.  Cardiac Enzymes: Recent Labs  Lab 12/01/23 1513  CKTOTAL 81    HbA1C: Hgb A1c MFr Bld  Date/Time Value Ref Range Status  01/27/2010 05:02 AM (H) <5.7 % Final   6.4 (NOTE)  According to the ADA Clinical Practice Recommendations for 2011, when HbA1c is used as a screening test:   >=6.5%   Diagnostic of Diabetes Mellitus           (if abnormal result  is confirmed)  5.7-6.4%   Increased risk of developing Diabetes Mellitus  References:Diagnosis and Classification of Diabetes Mellitus,Diabetes Care,2011,34(Suppl 1):S62-S69 and Standards of Medical Care in         Diabetes - 2011,Diabetes Care,2011,34  (Suppl 1):S11-S61.    CBG: Recent Labs  Lab 12/01/23 1647 12/01/23 1806  GLUCAP 127* 170*    Review of Systems:   Unable to obtain, intubated   Past Medical History:  She,  has a past medical history of Aneurysm (HCC), Anxiety, Frequent infections of left ear, and Thyroid disease.   Surgical History:   Past Surgical History:  Procedure Laterality Date   DESCENDING AORTIC ANEURYSM REPAIR W/ STENT     MYRINGOTOMY Left 2018     Social History:   reports that she has never smoked. She has never used smokeless tobacco. She reports that she does not drink alcohol and does not use drugs.   Family History:  Her family history includes Alzheimer's disease in her mother; Heart failure in her father; Hypertension in her father.   Allergies Allergies  Allergen Reactions   Iodinated Contrast Media Itching and Rash    CT scan done w/ IV dye on 06/08/12. Per pt / [facility] pt needs to be pre-medicated if needs future CT scans w/IV contrast.  CT scan done w/ IV dye on 06/08/12. Per pt / [facility] pt needs to be pre-medicated if needs future CT scans  w/IV contrast.     Ambien [Zolpidem] Other (See Comments)    Drove vehicle without knowledge of driving. From [place], [place]   Prochlorperazine Other (See Comments)   Honey Bee Venom Protein [Honey Bee Venom]    Iodides      Home Medications  Prior to Admission medications   Medication Sig Start Date End Date Taking? Authorizing Provider  DULoxetine (CYMBALTA) 30 MG capsule Take 1 a day 07/31/20   [provider]  fexofenadine (ALLEGRA ALLERGY) 180 MG tablet Take 1 tablet (180 mg total) by mouth daily for 15 days. 04/24/21 05/09/21  Trevor Iha, FNP  levothyroxine (SYNTHROID, LEVOTHROID) 137 MCG tablet Take 137 mcg by mouth daily before breakfast.    [provider]  LORazepam (ATIVAN) 1 MG tablet TAKE ONE (1) TABLET BY MOUTH TWICE DAILY DAY AS NEEDED FOR ANXIETY 11/17/16   [provider]  meclizine (ANTIVERT) 25 MG tablet Take 1 tablet (25 mg total) by mouth 3 (three) times daily as needed for dizziness. 12/22/20   Lattie Haw, MD  Multiple Vitamin (MULTI-VITAMIN) tablet Take 1 tablet by mouth daily.    [provider]  ondansetron (ZOFRAN-ODT) 4 MG disintegrating tablet Take 1 tablet (4 mg total) by mouth every 8 (eight) hours as needed for nausea or vomiting. Dissolve under tongue. 12/22/20   Lattie Haw, MD  OZEMPIC, 0.25 OR 0.5 MG/DOSE, 2 MG/1.5ML SOPN SMARTSIG:0.4 Milliliter(s) SUB-Q Once a Week 08/05/20   [provider]  predniSONE (DELTASONE) 20 MG tablet Take 3 tabs PO daily x 5 days. 10/20/23   Trevor Iha, FNP     Critical care time: 82 minutes       CRITICAL CARE Performed by: Lanier Clam   Total critical care time: 82 minutes  Critical care time was exclusive of separately billable procedures and  treating other patients. Critical care was necessary to treat or prevent imminent or life-threatening deterioration.  Critical care was time spent personally by me on the following activities: development of treatment  plan with patient and/or surrogate as well as nursing, discussions with consultants, evaluation of patient's response to treatment, examination of patient, obtaining history from patient or surrogate, ordering and performing treatments and interventions, ordering and review of laboratory studies, ordering and review of radiographic studies, pulse oximetry and re-evaluation of patient's condition.  Tessie Fass MSN, AGACNP-BC [facility] Pulmonary/Critical Care Medicine Amion for pager 12/01/2023, 6:24 PM

## 2023-12-01 NOTE — Progress Notes (Addendum)
 Addendum 12/01/23 5:40 PM: Called [facility] back to report acetaminophen level of 21 (Spoke with Kathlene November).  Family member they believe this occurred between ~0530 and ~0930 this morning based on communications with patient via telephone. Family member also able to confirm that acepromazine was not taken by the patient.  Pills brought in by EMS identified and are appropriate per pill bottle packaging with the exception of the small purple/plum colored tablet in the lorazepam bottle. I have not been able to identify this tablet yet.  [facility] recommendations based on update. -Obtain lactate -Repeat ECG to evaluate for qrs prolongation from propranolol  Does NOT recommend acetylcysteine at this time. This was determined by level below treatment line if assuming this occurred at 0530 this morning. If assuming unknown time of ingestion and was more chronic, lack of LFT elevation, APAP level < 25 mean still would not initiate treatment.  Otherwise same recommendations as below.  Delmar Landau, PharmD, BCPS 12/01/2023 5:48 PM ED Clinical Pharmacist -  [phone]   Poison Control Consult Documentation  [facility] has been contacted by request of Dr. Rosalia Hammers spoke with representative Kendal Hymen. EMS responded after PD found patient following request for well check.  Pt noted with pill bottles around including (Acepromazine (vetinary medicine; Norco 5/325; Propranolol 10mg ; Ativan 1mg ) unknown amounts ingested.  Time of exposure: Unk Patient Endorses Self-harm Intentions: UNK  ECG (Date/Time): 12/01/23:1503  QTc 430; QT 457; QRS 111; PR 220  Vitals: HR 40s, BP ~115 SBP, intubated  Labs: Tylenol level not ordered; Salicylate level not ordered; Ethanol not ordered  Summary  of [place] Poison Control Recommendations:  Obtain -APAP/Salycylate/EtOH -CMP -Lactic -LFTs  Acepromazine can cause: Seizures, bradycardia, dysrhythmias, resp & cns depressio, and urinary retention  BB Tox Recommendations (see  below)  Additional Monitoring -follow up labs from above -urinary output -keep on tele    Recommendations provided to requesting physician.  Delmar Landau, PharmD, BCPS Emergency Medicine Clinical Pharmacist 12/01/2023 3:05 PM

## 2023-12-02 ENCOUNTER — Inpatient Hospital Stay (HOSPITAL_COMMUNITY)

## 2023-12-02 DIAGNOSIS — G928 Other toxic encephalopathy: Secondary | ICD-10-CM

## 2023-12-02 DIAGNOSIS — E162 Hypoglycemia, unspecified: Secondary | ICD-10-CM

## 2023-12-02 DIAGNOSIS — R9431 Abnormal electrocardiogram [ECG] [EKG]: Secondary | ICD-10-CM | POA: Diagnosis not present

## 2023-12-02 DIAGNOSIS — R4189 Other symptoms and signs involving cognitive functions and awareness: Secondary | ICD-10-CM | POA: Diagnosis not present

## 2023-12-02 DIAGNOSIS — R579 Shock, unspecified: Secondary | ICD-10-CM

## 2023-12-02 DIAGNOSIS — R001 Bradycardia, unspecified: Secondary | ICD-10-CM | POA: Diagnosis not present

## 2023-12-02 LAB — ECHOCARDIOGRAM COMPLETE
AR max vel: 2.55 cm2
AV Area VTI: 2.52 cm2
AV Area mean vel: 2.78 cm2
AV Mean grad: 6.7 mmHg
AV Peak grad: 15.7 mmHg
Ao pk vel: 1.98 m/s
Est EF: >75
Height: 66 in
S' Lateral: 3.4 cm
Weight: 3767.22 [oz_av]

## 2023-12-02 LAB — CBC
HCT: 37.9 % (ref 36.0–46.0)
Hemoglobin: 12.2 g/dL (ref 12.0–15.0)
MCH: 29.4 pg (ref 26.0–34.0)
MCHC: 32.2 g/dL (ref 30.0–36.0)
MCV: 91.3 fL (ref 80.0–100.0)
Platelets: 193 10*3/uL (ref 150–400)
RBC: 4.15 MIL/uL (ref 3.87–5.11)
RDW: 13.7 % (ref 11.5–15.5)
WBC: 14.3 10*3/uL — ABNORMAL HIGH (ref 4.0–10.5)
nRBC: 0 % (ref 0.0–0.2)

## 2023-12-02 LAB — GLUCOSE, CAPILLARY
Glucose-Capillary: 104 mg/dL — ABNORMAL HIGH (ref 70–99)
Glucose-Capillary: 128 mg/dL — ABNORMAL HIGH (ref 70–99)
Glucose-Capillary: 128 mg/dL — ABNORMAL HIGH (ref 70–99)
Glucose-Capillary: 130 mg/dL — ABNORMAL HIGH (ref 70–99)
Glucose-Capillary: 134 mg/dL — ABNORMAL HIGH (ref 70–99)
Glucose-Capillary: 139 mg/dL — ABNORMAL HIGH (ref 70–99)
Glucose-Capillary: 139 mg/dL — ABNORMAL HIGH (ref 70–99)
Glucose-Capillary: 146 mg/dL — ABNORMAL HIGH (ref 70–99)
Glucose-Capillary: 147 mg/dL — ABNORMAL HIGH (ref 70–99)
Glucose-Capillary: 148 mg/dL — ABNORMAL HIGH (ref 70–99)
Glucose-Capillary: 149 mg/dL — ABNORMAL HIGH (ref 70–99)
Glucose-Capillary: 154 mg/dL — ABNORMAL HIGH (ref 70–99)
Glucose-Capillary: 154 mg/dL — ABNORMAL HIGH (ref 70–99)
Glucose-Capillary: 170 mg/dL — ABNORMAL HIGH (ref 70–99)
Glucose-Capillary: 188 mg/dL — ABNORMAL HIGH (ref 70–99)
Glucose-Capillary: 189 mg/dL — ABNORMAL HIGH (ref 70–99)
Glucose-Capillary: 195 mg/dL — ABNORMAL HIGH (ref 70–99)
Glucose-Capillary: 210 mg/dL — ABNORMAL HIGH (ref 70–99)
Glucose-Capillary: 211 mg/dL — ABNORMAL HIGH (ref 70–99)
Glucose-Capillary: 232 mg/dL — ABNORMAL HIGH (ref 70–99)
Glucose-Capillary: 236 mg/dL — ABNORMAL HIGH (ref 70–99)
Glucose-Capillary: 238 mg/dL — ABNORMAL HIGH (ref 70–99)
Glucose-Capillary: 245 mg/dL — ABNORMAL HIGH (ref 70–99)
Glucose-Capillary: 260 mg/dL — ABNORMAL HIGH (ref 70–99)
Glucose-Capillary: 276 mg/dL — ABNORMAL HIGH (ref 70–99)
Glucose-Capillary: 299 mg/dL — ABNORMAL HIGH (ref 70–99)

## 2023-12-02 LAB — BASIC METABOLIC PANEL
Anion gap: 8 (ref 5–15)
Anion gap: 8 (ref 5–15)
Anion gap: 5 (ref 5–15)
Anion gap: 8 (ref 5–15)
Anion gap: 7 (ref 5–15)
Anion gap: 6 (ref 5–15)
BUN: 12 mg/dL (ref 8–23)
BUN: 10 mg/dL (ref 8–23)
BUN: 5 mg/dL — ABNORMAL LOW (ref 8–23)
BUN: 5 mg/dL — ABNORMAL LOW (ref 8–23)
BUN: <5 mg/dL — ABNORMAL LOW (ref 8–23)
BUN: <5 mg/dL — ABNORMAL LOW (ref 8–23)
CO2: 22 mmol/L (ref 22–32)
CO2: 21 mmol/L — ABNORMAL LOW (ref 22–32)
CO2: 21 mmol/L — ABNORMAL LOW (ref 22–32)
CO2: 20 mmol/L — ABNORMAL LOW (ref 22–32)
CO2: 18 mmol/L — ABNORMAL LOW (ref 22–32)
CO2: 19 mmol/L — ABNORMAL LOW (ref 22–32)
Calcium: 8.5 mg/dL — ABNORMAL LOW (ref 8.9–10.3)
Calcium: 8.4 mg/dL — ABNORMAL LOW (ref 8.9–10.3)
Calcium: 8.2 mg/dL — ABNORMAL LOW (ref 8.9–10.3)
Calcium: 7.8 mg/dL — ABNORMAL LOW (ref 8.9–10.3)
Calcium: 7.8 mg/dL — ABNORMAL LOW (ref 8.9–10.3)
Calcium: 7.8 mg/dL — ABNORMAL LOW (ref 8.9–10.3)
Chloride: 107 mmol/L (ref 98–111)
Chloride: 108 mmol/L (ref 98–111)
Chloride: 110 mmol/L (ref 98–111)
Chloride: 101 mmol/L (ref 98–111)
Chloride: 99 mmol/L (ref 98–111)
Chloride: 95 mmol/L — ABNORMAL LOW (ref 98–111)
Creatinine, Ser: 0.75 mg/dL (ref 0.44–1.00)
Creatinine, Ser: 0.72 mg/dL (ref 0.44–1.00)
Creatinine, Ser: 0.67 mg/dL (ref 0.44–1.00)
Creatinine, Ser: 0.63 mg/dL (ref 0.44–1.00)
Creatinine, Ser: 0.59 mg/dL (ref 0.44–1.00)
Creatinine, Ser: 0.59 mg/dL (ref 0.44–1.00)
GFR, Estimated: >60 mL/min (ref >60)
GFR, Estimated: >60 mL/min (ref >60)
GFR, Estimated: >60 mL/min (ref >60)
GFR, Estimated: >60 mL/min (ref >60)
GFR, Estimated: >60 mL/min (ref >60)
GFR, Estimated: >60 mL/min (ref >60)
Glucose, Bld: 114 mg/dL — ABNORMAL HIGH (ref 70–99)
Glucose, Bld: 152 mg/dL — ABNORMAL HIGH (ref 70–99)
Glucose, Bld: 160 mg/dL — ABNORMAL HIGH (ref 70–99)
Glucose, Bld: 186 mg/dL — ABNORMAL HIGH (ref 70–99)
Glucose, Bld: 237 mg/dL — ABNORMAL HIGH (ref 70–99)
Glucose, Bld: 247 mg/dL — ABNORMAL HIGH (ref 70–99)
Potassium: 2.8 mmol/L — ABNORMAL LOW (ref 3.5–5.1)
Potassium: 3 mmol/L — ABNORMAL LOW (ref 3.5–5.1)
Potassium: 3.2 mmol/L — ABNORMAL LOW (ref 3.5–5.1)
Potassium: 3.8 mmol/L (ref 3.5–5.1)
Potassium: 4.1 mmol/L (ref 3.5–5.1)
Potassium: 4 mmol/L (ref 3.5–5.1)
Sodium: 137 mmol/L (ref 135–145)
Sodium: 137 mmol/L (ref 135–145)
Sodium: 136 mmol/L (ref 135–145)
Sodium: 129 mmol/L — ABNORMAL LOW (ref 135–145)
Sodium: 124 mmol/L — ABNORMAL LOW (ref 135–145)
Sodium: 120 mmol/L — ABNORMAL LOW (ref 135–145)

## 2023-12-02 LAB — PHOSPHORUS: Phosphorus: <1 mg/dL — CL (ref 2.5–4.6)

## 2023-12-02 LAB — COMPREHENSIVE METABOLIC PANEL
ALT: 25 U/L (ref 0–44)
AST: 32 U/L (ref 15–41)
Albumin: 2.8 g/dL — ABNORMAL LOW (ref 3.5–5.0)
Alkaline Phosphatase: 30 U/L — ABNORMAL LOW (ref 38–126)
Anion gap: 2 — ABNORMAL LOW (ref 5–15)
BUN: 6 mg/dL — ABNORMAL LOW (ref 8–23)
CO2: 23 mmol/L (ref 22–32)
Calcium: 8.3 mg/dL — ABNORMAL LOW (ref 8.9–10.3)
Chloride: 111 mmol/L (ref 98–111)
Creatinine, Ser: 0.86 mg/dL (ref 0.44–1.00)
GFR, Estimated: >60 mL/min (ref >60)
Glucose, Bld: 166 mg/dL — ABNORMAL HIGH (ref 70–99)
Potassium: 3.1 mmol/L — ABNORMAL LOW (ref 3.5–5.1)
Sodium: 136 mmol/L (ref 135–145)
Total Bilirubin: 0.7 mg/dL (ref 0.0–1.2)
Total Protein: 4.8 g/dL — ABNORMAL LOW (ref 6.5–8.1)

## 2023-12-02 LAB — POCT I-STAT 7, (LYTES, BLD GAS, ICA,H+H)
Acid-base deficit: 11 mmol/L — ABNORMAL HIGH (ref 0.0–2.0)
Bicarbonate: 14.7 mmol/L — ABNORMAL LOW (ref 20.0–28.0)
Calcium, Ion: 1.25 mmol/L (ref 1.15–1.40)
HCT: 34 % — ABNORMAL LOW (ref 36.0–46.0)
Hemoglobin: 11.6 g/dL — ABNORMAL LOW (ref 12.0–15.0)
O2 Saturation: 90 %
Patient temperature: 98.7
Potassium: 3.8 mmol/L (ref 3.5–5.1)
Sodium: 118 mmol/L — CL (ref 135–145)
TCO2: 16 mmol/L — ABNORMAL LOW (ref 22–32)
pCO2 arterial: 32.6 mmHg (ref 32–48)
pH, Arterial: 7.262 — ABNORMAL LOW (ref 7.35–7.45)
pO2, Arterial: 65 mmHg — ABNORMAL LOW (ref 83–108)

## 2023-12-02 LAB — HIV ANTIBODY (ROUTINE TESTING W REFLEX): HIV Screen 4th Generation wRfx: NONREACTIVE

## 2023-12-02 LAB — MAGNESIUM: Magnesium: 1.5 mg/dL — ABNORMAL LOW (ref 1.7–2.4)

## 2023-12-02 LAB — TRIGLYCERIDES: Triglycerides: 33 mg/dL (ref <150)

## 2023-12-02 MED ORDER — POTASSIUM CHLORIDE 10 MEQ/100ML IV SOLN
10.0000 meq | INTRAVENOUS | Status: AC
  Administered 2023-12-02 (×5): 10 meq via INTRAVENOUS
  Filled 2023-12-02 (×5): qty 100

## 2023-12-02 MED ORDER — ONDANSETRON HCL 4 MG/2ML IJ SOLN
INTRAMUSCULAR | Status: AC
  Administered 2023-12-02: 4 mg
  Filled 2023-12-02: qty 2

## 2023-12-02 MED ORDER — OLANZAPINE 10 MG IM SOLR
5.0000 mg | Freq: Once | INTRAMUSCULAR | Status: AC
  Administered 2023-12-02: 5 mg via INTRAMUSCULAR
  Filled 2023-12-02: qty 10

## 2023-12-02 MED ORDER — SODIUM CHLORIDE 0.9 % IV SOLN
1.0000 [IU]/kg/h | INTRAVENOUS | Status: DC
  Filled 2023-12-02: qty 10

## 2023-12-02 MED ORDER — POTASSIUM CHLORIDE 20 MEQ PO PACK
60.0000 meq | PACK | Freq: Once | ORAL | Status: AC
  Administered 2023-12-02: 60 meq via ORAL
  Filled 2023-12-02: qty 3

## 2023-12-02 MED ORDER — POTASSIUM CHLORIDE 10 MEQ/50ML IV SOLN
10.0000 meq | INTRAVENOUS | Status: AC
  Administered 2023-12-02 (×6): 10 meq via INTRAVENOUS
  Filled 2023-12-02 (×2): qty 50

## 2023-12-02 MED ORDER — ONDANSETRON HCL 4 MG/2ML IJ SOLN
4.0000 mg | Freq: Four times a day (QID) | INTRAMUSCULAR | Status: DC | PRN
  Administered 2023-12-03: 4 mg via INTRAVENOUS
  Filled 2023-12-02: qty 2

## 2023-12-02 MED ORDER — POTASSIUM CHLORIDE 10 MEQ/100ML IV SOLN
INTRAVENOUS | Status: AC
  Administered 2023-12-02: 10 meq via INTRAVENOUS
  Filled 2023-12-02: qty 100

## 2023-12-02 MED ORDER — ONDANSETRON HCL 4 MG/2ML IJ SOLN: 4.0000 mg | Freq: Four times a day (QID) | INTRAMUSCULAR | Status: DC

## 2023-12-02 MED ORDER — POTASSIUM PHOSPHATES 15 MMOLE/5ML IV SOLN
45.0000 mmol | Freq: Once | INTRAVENOUS | Status: AC
  Administered 2023-12-02: 45 mmol via INTRAVENOUS
  Filled 2023-12-02: qty 15

## 2023-12-02 MED ORDER — DEXTROSE 10% IV SOLUTION (ML/KG/HR)
3.0000 mL/kg/h | INTRAVENOUS | Status: DC
  Administered 2023-12-02: 3 mL/kg/h via INTRAVENOUS
  Filled 2023-12-02 (×5): qty 1000

## 2023-12-02 MED ORDER — MAGNESIUM SULFATE 4 GM/100ML IV SOLN
4.0000 g | Freq: Once | INTRAVENOUS | Status: AC
  Administered 2023-12-02: 4 g via INTRAVENOUS
  Filled 2023-12-02: qty 100

## 2023-12-02 MED ORDER — FUROSEMIDE 10 MG/ML IJ SOLN
40.0000 mg | Freq: Once | INTRAMUSCULAR | Status: AC
Start: 2023-12-02 — End: 2023-12-02
  Administered 2023-12-02: 40 mg via INTRAVENOUS
  Filled 2023-12-02: qty 4

## 2023-12-02 MED ORDER — STERILE WATER FOR INJECTION IJ SOLN
INTRAMUSCULAR | Status: AC
Start: 2023-12-02 — End: 2023-12-02
  Administered 2023-12-02: 10 mL
  Filled 2023-12-02: qty 10

## 2023-12-02 MED ORDER — OLANZAPINE 10 MG IM SOLR
5.0000 mg | Freq: Once | INTRAMUSCULAR | Status: AC | PRN
  Administered 2023-12-02: 5 mg via INTRAMUSCULAR
  Filled 2023-12-02: qty 10

## 2023-12-02 MED ORDER — MAGNESIUM SULFATE 2 GM/50ML IV SOLN
2.0000 g | Freq: Once | INTRAVENOUS | Status: AC
  Administered 2023-12-02: 2 g via INTRAVENOUS
  Filled 2023-12-02: qty 50

## 2023-12-02 MED ORDER — POTASSIUM CHLORIDE CRYS ER 20 MEQ PO TBCR: 40.0000 meq | EXTENDED_RELEASE_TABLET | ORAL | Status: DC

## 2023-12-02 MED ORDER — CALCIUM GLUCONATE-NACL 1-0.675 GM/50ML-% IV SOLN: 1.0000 g | Freq: Once | INTRAVENOUS | Status: DC

## 2023-12-02 NOTE — Progress Notes (Signed)
 eLink Physician-Brief Progress Note Patient Name: Amber Cruz DOB: March 10, 1956 MRN: [id number]   Date of Service  12/02/2023  HPI/Events of Note  BB overdose, on HIET Off epi gtt, HR 50s , worsening hypoxia +15L Labs show acute hyponatremia, acidosis   eICU Interventions  Decrease HIET - 1/2 dose of insulin & D10 Lasix 40 x 1 [facility]HFNC     Intervention Category Major Interventions: Acid-Base disturbance - evaluation and management;Electrolyte abnormality - evaluation and management  Alonnie Bieker V. Gautam Langhorst 12/02/2023, 10:57 PM

## 2023-12-02 NOTE — Procedures (Signed)
 Extubation Procedure Note  Patient Details:   Name: Amber Cruz DOB: 1955-12-13 MRN: [id number]   Airway Documentation:    Vent end date: 12/02/23 Vent end time: 0915   Evaluation  O2 sats: stable throughout Complications: No apparent complications Patient did tolerate procedure well. Bilateral Breath Sounds: Clear   Yes  Positive cuff leak prior to extubation. Pt placed on 4L [name]  [name] 12/02/2023, 9:17 AM

## 2023-12-02 NOTE — Progress Notes (Signed)
  Echocardiogram 2D Echocardiogram has been performed.  Amber Cruz 12/02/2023, 12:07 PM

## 2023-12-02 NOTE — H&P (Signed)
 NAME:  Amber Cruz, MRN:  [id number], DOB:  27-Mar-1956, LOS: 1 ADMISSION DATE:  12/01/2023, CONSULTATION DATE:  12/01/23 REFERRING MD:  Eloise Harman , CHIEF COMPLAINT:  AMS   History of Present Illness:  68 yo F PMH MDD, Anxiety, memory issues (family is describing what sounds like possible dementia) VWB disease, chronic pain // hemicrania continua who was BIB EMS 3/7 with decr LOC. Last seen this morning around 7. Family members had tried calling/texting from about 0830 onward and she did not answer/reply, which was unusual, prompting police welfare check. She was found w GCS 3, HR 40s, SBP 70s, and received narcan and glucagon without great improvement. Reportedly some pink pills on the ground at home and it is unclear what these are, and was also found with bottle of hydrocodone, labetalol, and bottle labeled lorazepam (though contents may not be lorazepam) as well as bottle ace Ace (Rx to dog, later it was determined that she did not take any of these). Per husband it sounds like she may be swapping meds with a friend, has been taking Zoloft the last few nights which sounds like her friend's Rx   In ED pt was intubated and started on propofol, received succ. She received atropine followed by epi gtt.    Poison control was engaged and rec labs, VS optimization, serial EKG    PCCM is called for admission in this setting    Pertinent  Medical History   MDD Anxiety VWB dz  Significant Hospital Events: Including procedures, antibiotic start and stop dates in addition to other pertinent events   3/7 admit to ICU after OD   Interim History / Subjective:  Remains on high dose epi Levels of brady seem to coincide with bearing down and levels of alterness Daughter and husband at bedside.  Objective   Blood pressure (!) 104/52, pulse (!) 38, temperature 98.4 F (36.9 C), temperature source Axillary, resp. rate (!) 23, height 5\' 6"  (1.676 m), weight 106.8 kg, SpO2 99%.    Vent Mode: PRVC FiO2  (%):  [40 %-80 %] 40 % Set Rate:  [15 bmp-18 bmp] 18 bmp Vt Set:  [480 mL] 480 mL PEEP:  [5 cmH20] 5 cmH20 Plateau Pressure:  [11 cmH20-20 cmH20] 20 cmH20   Intake/Output Summary (Last 24 hours) at 12/02/2023 0831 Last data filed at 12/02/2023 0700 Gross per 24 hour  Intake 9417.43 ml  Output 4285 ml  Net 5132.43 ml   Filed Weights   12/01/23 2100 12/02/23 0500  Weight: 95.3 kg 106.8 kg    Examination: Awake on vent following commands Ext warm Lungs sound okay Heart sounds brady, irregular  K/Phos/Mag being repleted  Resolved Hospital Problem list     Assessment & Plan:  Acute toxic metabolic encephalopathy- in context of presumed polysubstance overdose; opiate and benzo on UDS (not Rx to her) Hx of progressive memory loss- 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 Longstanding depression and anxiety with prior SA Shock state, bradycardia- refractory to epi, query overlying vagal hyperactivity Hypo-K/Mg/Phos  - Wean to extubate then figure out what to do with HIET depending on HR - Replete K/Phos/Mg - DC unasyn, follow fever curve - Suicide precautions - Once more awake, need psych eval and realistic discussion with husband if he is going to be able to take care of his wife who has active SI and memory issues  Best Practice (right click and "Reselect all SmartList Selections" daily)   Diet/type: NPO DVT prophylaxis prophylactic heparin  Pressure ulcer(s): none GI prophylaxis: PPI Lines: Central line Foley:  Yes, and it is still needed Code Status:  DNR Last date of multidisciplinary goals of care discussion: family updated at bedside  38 min cc time Myrla Halsted MD

## 2023-12-03 DIAGNOSIS — F411 Generalized anxiety disorder: Secondary | ICD-10-CM | POA: Diagnosis not present

## 2023-12-03 DIAGNOSIS — R001 Bradycardia, unspecified: Secondary | ICD-10-CM | POA: Diagnosis not present

## 2023-12-03 DIAGNOSIS — R4189 Other symptoms and signs involving cognitive functions and awareness: Secondary | ICD-10-CM | POA: Diagnosis not present

## 2023-12-03 DIAGNOSIS — R579 Shock, unspecified: Secondary | ICD-10-CM | POA: Diagnosis not present

## 2023-12-03 DIAGNOSIS — F332 Major depressive disorder, recurrent severe without psychotic features: Secondary | ICD-10-CM | POA: Diagnosis not present

## 2023-12-03 DIAGNOSIS — G928 Other toxic encephalopathy: Secondary | ICD-10-CM | POA: Diagnosis not present

## 2023-12-03 LAB — BASIC METABOLIC PANEL
Anion gap: 6 (ref 5–15)
Anion gap: 5 (ref 5–15)
Anion gap: 7 (ref 5–15)
Anion gap: 7 (ref 5–15)
BUN: <5 mg/dL — ABNORMAL LOW (ref 8–23)
BUN: <5 mg/dL — ABNORMAL LOW (ref 8–23)
BUN: <5 mg/dL — ABNORMAL LOW (ref 8–23)
BUN: <5 mg/dL — ABNORMAL LOW (ref 8–23)
CO2: 20 mmol/L — ABNORMAL LOW (ref 22–32)
CO2: 20 mmol/L — ABNORMAL LOW (ref 22–32)
CO2: 20 mmol/L — ABNORMAL LOW (ref 22–32)
CO2: 21 mmol/L — ABNORMAL LOW (ref 22–32)
Calcium: 7.9 mg/dL — ABNORMAL LOW (ref 8.9–10.3)
Calcium: 7.8 mg/dL — ABNORMAL LOW (ref 8.9–10.3)
Calcium: 8.2 mg/dL — ABNORMAL LOW (ref 8.9–10.3)
Calcium: 8.6 mg/dL — ABNORMAL LOW (ref 8.9–10.3)
Chloride: 95 mmol/L — ABNORMAL LOW (ref 98–111)
Chloride: 96 mmol/L — ABNORMAL LOW (ref 98–111)
Chloride: 99 mmol/L (ref 98–111)
Chloride: 100 mmol/L (ref 98–111)
Creatinine, Ser: 0.63 mg/dL (ref 0.44–1.00)
Creatinine, Ser: 0.5 mg/dL (ref 0.44–1.00)
Creatinine, Ser: 0.61 mg/dL (ref 0.44–1.00)
Creatinine, Ser: 0.79 mg/dL (ref 0.44–1.00)
GFR, Estimated: >60 mL/min (ref >60)
GFR, Estimated: >60 mL/min (ref >60)
GFR, Estimated: >60 mL/min (ref >60)
GFR, Estimated: >60 mL/min (ref >60)
Glucose, Bld: 126 mg/dL — ABNORMAL HIGH (ref 70–99)
Glucose, Bld: 76 mg/dL (ref 70–99)
Glucose, Bld: 76 mg/dL (ref 70–99)
Glucose, Bld: 186 mg/dL — ABNORMAL HIGH (ref 70–99)
Potassium: 3.6 mmol/L (ref 3.5–5.1)
Potassium: 3.4 mmol/L — ABNORMAL LOW (ref 3.5–5.1)
Potassium: 3.5 mmol/L (ref 3.5–5.1)
Potassium: 4 mmol/L (ref 3.5–5.1)
Sodium: 121 mmol/L — ABNORMAL LOW (ref 135–145)
Sodium: 121 mmol/L — ABNORMAL LOW (ref 135–145)
Sodium: 126 mmol/L — ABNORMAL LOW (ref 135–145)
Sodium: 128 mmol/L — ABNORMAL LOW (ref 135–145)

## 2023-12-03 LAB — GLUCOSE, CAPILLARY
Glucose-Capillary: 132 mg/dL — ABNORMAL HIGH (ref 70–99)
Glucose-Capillary: 133 mg/dL — ABNORMAL HIGH (ref 70–99)
Glucose-Capillary: 145 mg/dL — ABNORMAL HIGH (ref 70–99)
Glucose-Capillary: 196 mg/dL — ABNORMAL HIGH (ref 70–99)
Glucose-Capillary: 199 mg/dL — ABNORMAL HIGH (ref 70–99)
Glucose-Capillary: 209 mg/dL — ABNORMAL HIGH (ref 70–99)
Glucose-Capillary: 215 mg/dL — ABNORMAL HIGH (ref 70–99)
Glucose-Capillary: 224 mg/dL — ABNORMAL HIGH (ref 70–99)
Glucose-Capillary: 228 mg/dL — ABNORMAL HIGH (ref 70–99)
Glucose-Capillary: 312 mg/dL — ABNORMAL HIGH (ref 70–99)
Glucose-Capillary: 69 mg/dL — ABNORMAL LOW (ref 70–99)
Glucose-Capillary: 73 mg/dL (ref 70–99)
Glucose-Capillary: 75 mg/dL (ref 70–99)
Glucose-Capillary: 76 mg/dL (ref 70–99)
Glucose-Capillary: 81 mg/dL (ref 70–99)
Glucose-Capillary: 96 mg/dL (ref 70–99)
Glucose-Capillary: 98 mg/dL (ref 70–99)
Glucose-Capillary: 99 mg/dL (ref 70–99)
Glucose-Capillary: >600 mg/dL (ref 70–99)

## 2023-12-03 LAB — MAGNESIUM: Magnesium: 1.9 mg/dL (ref 1.7–2.4)

## 2023-12-03 LAB — HEPATIC FUNCTION PANEL
ALT: 29 U/L (ref 0–44)
AST: 41 U/L (ref 15–41)
Albumin: 2.9 g/dL — ABNORMAL LOW (ref 3.5–5.0)
Alkaline Phosphatase: 40 U/L (ref 38–126)
Bilirubin, Direct: <0.1 mg/dL (ref 0.0–0.2)
Total Bilirubin: 0.8 mg/dL (ref 0.0–1.2)
Total Protein: 5.4 g/dL — ABNORMAL LOW (ref 6.5–8.1)

## 2023-12-03 LAB — PHOSPHORUS: Phosphorus: 2 mg/dL — ABNORMAL LOW (ref 2.5–4.6)

## 2023-12-03 MED ORDER — SODIUM CHLORIDE 0.9 % IV SOLN
0.5000 [IU]/kg/h | INTRAVENOUS | Status: DC
  Filled 2023-12-03: qty 10

## 2023-12-03 MED ORDER — POTASSIUM CHLORIDE CRYS ER 20 MEQ PO TBCR: 40.0000 meq | EXTENDED_RELEASE_TABLET | Freq: Once | ORAL | Status: DC

## 2023-12-03 MED ORDER — STERILE WATER FOR INJECTION IJ SOLN
INTRAMUSCULAR | Status: AC
  Administered 2023-12-03: 2.4 mL
  Filled 2023-12-03: qty 10

## 2023-12-03 MED ORDER — ZIPRASIDONE MESYLATE 20 MG IM SOLR
20.0000 mg | Freq: Once | INTRAMUSCULAR | Status: AC
  Administered 2023-12-03: 20 mg via INTRAMUSCULAR
  Filled 2023-12-03: qty 20

## 2023-12-03 MED ORDER — LORAZEPAM 2 MG/ML IJ SOLN
1.0000 mg | INTRAMUSCULAR | Status: DC | PRN
  Administered 2023-12-03: 2 mg via INTRAVENOUS
  Administered 2023-12-03: 1 mg via INTRAVENOUS
  Administered 2023-12-04 – 2023-12-05 (×5): 2 mg via INTRAVENOUS
  Filled 2023-12-03 (×7): qty 1

## 2023-12-03 MED ORDER — POTASSIUM CHLORIDE CRYS ER 20 MEQ PO TBCR
40.0000 meq | EXTENDED_RELEASE_TABLET | ORAL | Status: DC
  Filled 2023-12-03: qty 2

## 2023-12-03 MED ORDER — DEXTROSE 50 % IV SOLN
50.0000 mL | Freq: Once | INTRAVENOUS | Status: AC
  Administered 2023-12-03: 50 mL via INTRAVENOUS

## 2023-12-03 MED ORDER — OLANZAPINE 10 MG IM SOLR
5.0000 mg | Freq: Once | INTRAMUSCULAR | Status: AC
  Administered 2023-12-03: 5 mg via INTRAVENOUS
  Filled 2023-12-03: qty 10

## 2023-12-03 MED ORDER — STERILE WATER FOR INJECTION IJ SOLN
INTRAMUSCULAR | Status: AC
  Filled 2023-12-03: qty 10

## 2023-12-03 MED ORDER — LORAZEPAM 2 MG/ML IJ SOLN: 1.0000 mg | Freq: Once | INTRAMUSCULAR | Status: DC

## 2023-12-03 MED ORDER — MAGNESIUM SULFATE 2 GM/50ML IV SOLN
2.0000 g | Freq: Once | INTRAVENOUS | Status: AC
  Administered 2023-12-03: 2 g via INTRAVENOUS
  Filled 2023-12-03: qty 50

## 2023-12-03 MED ORDER — OLANZAPINE 10 MG IM SOLR
5.0000 mg | INTRAMUSCULAR | Status: AC | PRN
Start: 2023-12-03 — End: 2023-12-03
  Administered 2023-12-03 (×2): 5 mg via INTRAMUSCULAR
  Filled 2023-12-03 (×3): qty 10

## 2023-12-03 MED ORDER — OLANZAPINE 10 MG IM SOLR
5.0000 mg | Freq: Every evening | INTRAMUSCULAR | Status: DC | PRN
  Administered 2023-12-03: 5 mg via INTRAMUSCULAR
  Filled 2023-12-03 (×2): qty 10

## 2023-12-03 MED ORDER — FUROSEMIDE 10 MG/ML IJ SOLN
40.0000 mg | Freq: Once | INTRAMUSCULAR | Status: AC
  Administered 2023-12-03: 40 mg via INTRAVENOUS
  Filled 2023-12-03: qty 4

## 2023-12-03 MED ORDER — DEXTROSE 10% IV SOLUTION (ML/KG/HR)
4.0000 mL/kg/h | INTRAVENOUS | Status: DC
  Administered 2023-12-03 (×4): 4 mL/kg/h via INTRAVENOUS
  Filled 2023-12-03 (×14): qty 1000

## 2023-12-03 MED ORDER — LORAZEPAM 2 MG/ML IJ SOLN
1.0000 mg | Freq: Once | INTRAMUSCULAR | Status: AC
  Administered 2023-12-03: 1 mg via INTRAVENOUS
  Filled 2023-12-03: qty 1

## 2023-12-03 MED ORDER — DEXTROSE 5 % IV SOLN
15.0000 mmol | Freq: Once | INTRAVENOUS | Status: AC
  Administered 2023-12-03: 15 mmol via INTRAVENOUS
  Filled 2023-12-03: qty 5

## 2023-12-03 MED ORDER — DEXTROSE 50 % IV SOLN: 25.0000 g | Freq: Once | INTRAVENOUS | Status: DC

## 2023-12-03 NOTE — Progress Notes (Addendum)
 eLink Physician-Brief Progress Note Patient Name: Amber Cruz DOB: Feb 13, 1956 MRN: [id number]   Date of Service  12/03/2023  HPI/Events of Note  Glucose downtrending.  HR maintaining in 50s  eICU Interventions  Will decrease insulin infusion to 0.5units/kg Will follow glucose levels, adjust D10 accordingly .   Pt also agitated. Placed order for zyprexa 5mg  IM        Jaidev Sanger M DELA CRUZ 12/03/2023, 1:52 AM  4:08 AM Held insulin drip for hypoglycemia.  Will follow glucose trends.  Will reassess need for HIET given that HR now 50s.   5:05 AM Glucose again downtrending despite insulin drip being off.  Will give D50 push and increase D10 to 29ml/kg/hr

## 2023-12-03 NOTE — Progress Notes (Addendum)
 eLink Physician-Brief Progress Note Patient Name: Janautica Netzley DOB: Oct 24, 1955 MRN: [id number]   Date of Service  12/03/2023  HPI/Events of Note  K 4.0 Mg 1.9 Phos 2.0  eICU Interventions  Mag phos ordered.  K phos already due this evening    2102 -severe agitation persisting.  Will repeat dose of Zyprexa and add 4 point restraints.  2222 -refractory agitation despite Zyprexa and 4 point restraints.  Extremely restless.  Add on Ativan x 1.  Intervention Category Minor Interventions: Electrolytes abnormality - evaluation and management  Damarius Karnes 12/03/2023, 7:09 PM

## 2023-12-03 NOTE — Progress Notes (Signed)
 NAME:  Amber Cruz, MRN:  [id number], DOB:  Jan 29, 1956, LOS: 2 ADMISSION DATE:  12/01/2023, CONSULTATION DATE:  12/01/23 REFERRING MD:  Eloise Harman , CHIEF COMPLAINT:  AMS   History of Present Illness:  68 yo F PMH MDD, Anxiety, memory issues (family is describing what sounds like possible dementia) VWB disease, chronic pain / hemicrani continua who was BIB EMS 3/7 with decr LOC. Last seen this morning around 7. Family members had tried calling/texting from about 0830 onward and she did not answer/reply, which was unusual, prompting police welfare check. She was found w GCS 3, HR 40s, SBP 70s, and received narcan and glucagon without great improvement. Reportedly some pink pills on the ground at home and it is unclear what these are, and was also found with bottle of hydrocodone, labetalol, and bottle labeled lorazepam (though contents may not be lorazepam) as well as bottle ace Ace (Rx to dog, later it was determined that she did not take any of these). Per husband it sounds like she may be swapping meds with a friend, has been taking Zoloft the last few nights which sounds like her friend's Rx   In ED pt was intubated and started on propofol, received succ. She received atropine followed by epi gtt.    Poison control was engaged and rec labs, VS optimization, serial EKG   PCCM is called for admission in this setting   Pertinent  Medical History  MDD Anxiety VWB dz  Significant Hospital Events: Including procedures, antibiotic start and stop dates in addition to other pertinent events   3/7 admit to ICU after OD, intubated, HIET, epi gtt 3/8 extubated, off high dose insulin overnight, weaning epi, diuresed  Interim History / Subjective:  CBGs remain in 70's despite D10 at 300's/ hr Lasix overnight and HHFNC for WOB, remains net +15L Zyprexa overnight for agitation Epi 9.5 S/p Psych consult this am  Objective   Blood pressure 100/63, pulse 65, temperature 98.7 F (37.1 C), temperature  source Axillary, resp. rate 17, height 5\' 6"  (1.676 m), weight 106.8 kg, SpO2 96%.    FiO2 (%):  [50 %-88 %] 50 %   Intake/Output Summary (Last 24 hours) at 12/03/2023 1433 Last data filed at 12/03/2023 1424 Gross per 24 hour  Intake 10627.46 ml  Output 8940 ml  Net 1687.46 ml   Filed Weights   12/01/23 2100 12/02/23 0500  Weight: 95.3 kg 106.8 kg   Examination: General:  obese female sitting upright in bed in NAD, safety sitter at bedside HEENT: MM pink/moist, pupils 3/r Neuro: awakens easily to verbal, oriented to person/ place, MAE, follows commands CV: rr, SR 60s, L internal jugular CVL PULM:  HHFNC 30L /50%, mild WOB/ abd but no distress, clear anteriorly, faint bibasilar rales slight wheeze GI: bs+, NT, foley- nearly clear/ very pale UOP Extremities: warm/dry, no tibial edema Skin: no rashes or lesions  UOP 6L/ 24hr with additional 3.8L since 7am> remains net +14.7  Labs> Na 121> 126, K 3.5, bicarb 20 afebrile  Resolved Hospital Problem list     Assessment & Plan:  Acute toxic metabolic encephalopathy- in context of presumed polysubstance overdose; opiate and benzo on UDS (not Rx to her) Hx of progressive memory loss- 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 Longstanding depression and anxiety with prior SI Shock state, bradycardia 2/2 suspected intentional OD, possible BB/ CCB- refractory to epi, query overlying vagal hyperactivity Hypoglycemia 2/2 high dose insulin/ NPO Hypo-K/Mg/Phos Hypervolemia P:  - cont to  wean HHFNC for sat goal > 92%, monitor WOB - airway watch/ cont neuro exams - cont to titrate epi gtt for HR > 50, MAP > 65 - off high dose insulin but likely residual effect given ongoing high D10 needs/ borderline CBGs> continue to monitor closely - pending SLP> advance diet as tolerated to wean D10 - pulm hygiene, IS, prn neb - additional lasix 40mg  x 1 and KCL as volume overloaded  - repeat BMET q4 for now - remains afebrile, trend  clinically/ fever curve - add on Mag, phos, and LFTs (if LFTs up, repeat tylenol level given level 21 but normal LFTs)> caution with aggressive K replete as insulin clears but also diuresing. - monitor Na on BMET- acute shift 2/2 to D10/ volume amount - cont to wean D10, titrate to CBGs - UA pending  - TSH, B12, folate in am  - recs per psych to continue to hold all psychiatric meds - monitor QTC - SI precautions - psych recs for inpt psych admit after medically cleared, IVC if attempts to leave AMA.  Husband and daughter are aware and are in agreement.  - hx snoring, would benefit from outpt sleep study  Best Practice (right click and "Reselect all SmartList Selections" daily)   Diet/type: NPO pending bedside SLP DVT prophylaxis prophylactic heparin  Pressure ulcer(s): none GI prophylaxis: N/A Lines: Central line Foley:  Yes, and it is still needed Code Status:  DNR Last date of multidisciplinary goals of care discussion:  Husband/ daughter updated at bedside 3/9am   CCT 38 mins   Posey Boyer, MSN, AG-ACNP-BC [facility] 12/03/2023, 2:33 PM  See Amion for pager If no response to pager , please call [phone] until 7pm After 7:00 pm call Elink  [phone]

## 2023-12-03 NOTE — Consult Note (Signed)
 [facility] Psychiatric Consult Initial  Patient Name: .Amber Cruz  MRN: [id number]  DOB: [birth year]  Consult Order details:  Orders (From admission, onward)     Start     Ordered   12/02/23 1851  IP CONSULT TO PSYCHIATRY       Ordering Provider: Lorin Glass, MD  Provider:  (Not yet assigned)  Question Answer Comment  Location [facility]   Reason for Consult? suicide attempt      12/02/23 1850             Mode of Visit: In person    Psychiatry Consult Evaluation  Service Date: December 03, 2023 LOS:  LOS: 2 days  Chief Complaint "polysubstance overdose in a suicide attempt"  Primary Psychiatric Diagnoses  Major depressive disorder, recurrent, severe 2.  Generalized Anxiety Disorder   Assessment  Amber Cruz is a 68 y.o. female admitted: Medicallyfor 12/01/2023  2:45 PM with loss of consciousness after overdose. 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 Psychiatry consulted for suicide attempt.  Her current presentation of recent multiple substance overdose and ongoing depressive symptoms is most consistent with major depressive disorder, recurrent, severe. She meets criteria for inpatient admission based on recent overdose, being danger to herself and poorly treated depression due to medication non complaint. Current outpatient psychotropic medications include Zoloft 50 mg daily and propranolol 10 mg PRN for anxiety but unsure if this medications are working.She was not compliant with medications prior to admission as evidenced by reports from husband and daughter. On initial examination, patient is awake but altered, she is disoriented to time, place, person and situation. Please see plan below for detailed recommendations.   Diagnoses:  Active Hospital problems: Principal Problem:   Overdose Active Problems:   DNR (do not  resuscitate)    Plan   ## Psychiatric Medication Recommendations:  Hold all medications for now We recommend transferring patient to inpatient psychiatric unit once she is medically cleared Consider TOC/Social worker consult to facilitate inpatient transfer.  ## Medical Decision Making Capacity: Not specifically addressed in this encounter  ## Further Work-up:  TSH, B12, folate -- most recent EKG on 12/03/2023 had QtC of 400 -- Pertinent labwork reviewed earlier this admission includes: Na-121, K-3.4, Ca-7.8   ## Disposition:-- We recommend inpatient psychiatric hospitalization when medically cleared. Patient is under voluntary admission status at this time; please IVC if attempts to leave hospital.  ## Behavioral / Environmental: -Delirium Precautions: Delirium Interventions for Nursing and Staff: - RN to open blinds every AM. - To Bedside: Glasses, hearing aide, and pt's own shoes. Make available to patients. when possible and encourage use. - Encourage po fluids when appropriate, keep fluids within reach. - OOB to chair with meals. - Passive ROM exercises to all extremities with AM & PM care. - RN to assess orientation to person, time and place QAM and PRN. - Recommend extended visitation hours with familiar family/friends as feasible. - Staff to minimize disturbances at night. Turn off television when pt asleep or when not in use.    ## Safety and Observation Level:  - Based on my clinical evaluation, I estimate the patient to be at high risk of self harm in the current setting. - At this time, we recommend  1:1 Observation. This decision is based on my review of the chart including patient's history and current presentation, interview of the  patient, mental status examination, and consideration of suicide risk including evaluating suicidal ideation, plan, intent, suicidal or self-harm behaviors, risk factors, and protective factors. This judgment is based on our ability to directly  address suicide risk, implement suicide prevention strategies, and develop a safety plan while the patient is in the clinical setting. Please contact our team if there is a concern that risk level has changed.  CSSR Risk Category:   Suicide Risk Assessment: Patient has following modifiable risk factors for suicide: active suicidal ideation, under treated depression , and medication noncompliance, which we are addressing by monitoring with the plan to transfer to psychiatric inpatient. Patient has following non-modifiable or demographic risk factors for suicide: history of suicide attempt, history of self harm behavior, and psychiatric hospitalization Patient has the following protective factors against suicide: Supportive family  Thank you for this consult request. Recommendations have been communicated to the primary team.  We will follow up at this time.   Fredonia Highland, MD       History of Present Illness  Relevant Aspects of [facility] Course:  Admitted on 12/01/2023 for loss of consciousness.   Patient Report:  Patient seen face to face in her hospital room. She is awake but altered, and disoriented to time, place, person and situation. Patient could not participate in evaluation due to altered mental status, as a result, majority of the history was obtained from the daughter and husband at the bedside. Husband reports patient has been suffering from recurrent major depression and severe anxiety for a long time which got worse due to their recent relocating. Husband explained further that patient was found unconscious with an empty bottle of an unknown amount of her prescribed medications and husband's medications including Lorazepam, Propranolol, and hydrocodone. Urine toxicology is positive for Opiate and Benzodiazepine. He states that patient currently sees a psychiatry NP via tele health at [facility] hospital who prescribes Zoloft 50 mg daily and Propranolol  10 mg PRN, but she has not been compliant with her medications. She had tried multiple medications in the past but would not take it; always complaining that medications are not working without giving it time to kick in before stopping it.    Daughter reports previous suicide attempts via overdose and psychiatric hospitalizations in the past but a long time ago. She believes her mother is severely depressed and suffering from severe anxiety. Daughter and husband also report ongoing memory issues for the past two years, and patient was evaluated by a Neurologist few months ago, but the report was negative for Dementia. Daughter denies delusions, hallucinations, and other perceptual disturbances.She also denies use of illicit substances but she drinks alcohol occasionally.   Collateral information:  Information obtained from patient's daughter and husband.   Review of Systems  Psychiatric/Behavioral:  Positive for depression and suicidal ideas.      Psychiatric and Social History  Psychiatric History:  Information collected from husband and daughter   Prev Dx/Sx: MDD/GAD Current Psych Provider: NP at [facility] Meds (current): Zoloft 50 mg daily and Propranolol 10 mg PRN Previous Med Trials: Most SSRI/SNRI-unsure.  Therapy: denies   Prior Psych Hospitalization: yes, but more than 35 years ago due to suicide attempt.   Prior Self Harm: yes Prior Violence: denies   Family Psych History: denies  Family Hx suicide: denies   Social History:  Educational Hx: high school graduate and some certification Occupational Hx: In the process of  retiring.  Legal Hx: denies  Living Situation: Lives with husband of 16 years.  Spiritual Hx: unknwn  Access to weapons/lethal means: denies    Substance History Alcohol: socially  Type of alcohol wine mostly Last Drink weeks ago Number of drinks per day unknown  History of alcohol withdrawal seizures denies   History of DT's denies  Tobacco: denies  Illicit drugs: denies  Prescription drug abuse: denies  Rehab hx: denies   Exam Findings  Physical Exam:  Vital Signs:  Temp:  [97.7 F (36.5 C)-98.9 F (37.2 C)] 98.7 F (37.1 C) (03/09 0800) Pulse Rate:  [43-63] 60 (03/09 0803) Resp:  [8-30] 20 (03/09 0803) BP: (72-155)/(45-123) 90/60 (03/09 0700) SpO2:  [82 %-100 %] 98 % (03/09 0803) FiO2 (%):  [69 %-88 %] 69 % (03/09 0803) Blood pressure 90/60, pulse 60, temperature 98.7 F (37.1 C), temperature source Axillary, resp. rate 20, height 5\' 6"  (1.676 m), weight 106.8 kg, SpO2 98%. Body mass index is 38 kg/m.  Physical Exam  Mental Status Exam: General Appearance: Casual  Orientation:  Negative  Memory:   unable to assess  Concentration:  Concentration: Poor  Recall:  NA  Attention  Poor  Eye Contact:  None  Speech:   unable to assess  Language:   unable to assess  Volume:   unable to assess  Mood: "depressed"  Affect:  Blunt  Thought Process:  unable to assess  Thought Content:   unable to assess  Suicidal Thoughts:   unable to assess  Homicidal Thoughts:   unable to assess  Judgement:  Other:  unable to assess  Insight:   unable to assess  Psychomotor Activity:  Decreased  Akathisia:  No  Fund of Knowledge:   unable to assess      Assets:  Housing Social Support  Cognition:  Impaired,  Mild  ADL's:  Intact  AIMS (if indicated):        Other History   These have been pulled in through the EMR, reviewed, and updated if appropriate.  Family History:  The patient's family history includes Alzheimer's disease in her mother; Heart failure in her father; Hypertension in her father.  Medical History: Past Medical History:  Diagnosis Date   Aneurysm (HCC)    Anxiety    Frequent infections of left ear    Thyroid disease     Surgical History: Past Surgical History:  Procedure Laterality Date   DESCENDING AORTIC ANEURYSM REPAIR W/ STENT     MYRINGOTOMY Left 2018      Medications:   Current Facility-Administered Medications:    Chlorhexidine Gluconate Cloth 2 % PADS 6 each, 6 each, Topical, Daily, Agarwala, Ravi, MD, 6 each at 12/02/23 1039   dextrose 10 % infusion, 4 mL/kg/hr, Intravenous, Continuous, Gaetana Michaelis, MD, Last Rate: 381.2 mL/hr at 12/03/23 0939, 4 mL/kg/hr at 12/03/23 0939   docusate (COLACE) 50 MG/5ML liquid 100 mg, 100 mg, Per Tube, BID PRN, Leander Rams, RPH   EPINEPHrine (ADRENALIN) 5 mg in NS 250 mL (0.02 mg/mL) premix infusion, 0.5-40 mcg/min, Intravenous, Titrated, Gaetana Michaelis, MD, Last Rate: 28.5 mL/hr at 12/03/23 0700, 9.5 mcg/min at 12/03/23 0700   heparin injection 5,000 Units, 5,000 Units, Subcutaneous, Q8H, Cloyd Stagers M, PA-C, 5,000 Units at 12/03/23 0546   insulin HIGH DOSE (4 units/mL) infusion for beta blocker/calcium channel blocker overdose, 0.5 Units/kg/hr, Intravenous, Titrated, Gaetana Michaelis, MD, Held at 12/03/23 0425   ondansetron [facility]) injection 4 mg, 4  mg, Intravenous, Q6H PRN, Lorin Glass, MD, 4 mg at 12/03/23 0853   polyethylene glycol (MIRALAX / GLYCOLAX) packet 17 g, 17 g, Per Tube, Daily PRN, Cloyd Stagers M, PA-C   sodium chloride flush (NS) 0.9 % injection 10-40 mL, 10-40 mL, Intracatheter, Q12H, Agarwala, Ravi, MD, 10 mL at 12/02/23 2238   sodium chloride flush (NS) 0.9 % injection 10-40 mL, 10-40 mL, Intracatheter, PRN, Lynnell Catalan, MD  Allergies: Allergies  Allergen Reactions   Iodinated Contrast Media Itching and Rash    CT scan done w/ IV dye on 06/08/12. Per pt / [facility] pt needs to be pre-medicated if needs future CT scans w/IV contrast.  CT scan done w/ IV dye on 06/08/12. Per pt / [facility] pt needs to be pre-medicated if needs future CT scans w/IV contrast.     Ambien [Zolpidem] Other (See Comments)    Drove vehicle without knowledge of driving. From [place], [place]   Prochlorperazine Other (See Comments)   Honey Bee Venom Protein [place] April Bee Venom]    Iodides      Fredonia Highland, MD

## 2023-12-03 NOTE — Progress Notes (Signed)
 12/03/2023 Seen and examined. Epi needs coming down. Off insulin drip Increased SOB, hyponatremia related to high D10 needs overnight, responding to diuresis Poor insight on exam but more clearheaded than yesterday Wean O2 Wean epi Psych to admit to inpatient once resp status and shock state improved Replete K PRN Can use ativan (suspect high baseline use) and zyprexa PRN at bedtime given degree of agitation/aggression pulling at lines/tubes etc Family updated  My cc time 15 mins, see APP note for additional details/plan Myrla Halsted MD PCCM

## 2023-12-04 DIAGNOSIS — G928 Other toxic encephalopathy: Secondary | ICD-10-CM | POA: Diagnosis not present

## 2023-12-04 DIAGNOSIS — T50902A Poisoning by unspecified drugs, medicaments and biological substances, intentional self-harm, initial encounter: Secondary | ICD-10-CM

## 2023-12-04 DIAGNOSIS — R579 Shock, unspecified: Secondary | ICD-10-CM | POA: Diagnosis not present

## 2023-12-04 DIAGNOSIS — R4189 Other symptoms and signs involving cognitive functions and awareness: Principal | ICD-10-CM

## 2023-12-04 LAB — VITAMIN B12: Vitamin B-12: 563 pg/mL (ref 180–914)

## 2023-12-04 LAB — GLUCOSE, CAPILLARY
Glucose-Capillary: 102 mg/dL — ABNORMAL HIGH (ref 70–99)
Glucose-Capillary: 103 mg/dL — ABNORMAL HIGH (ref 70–99)
Glucose-Capillary: 136 mg/dL — ABNORMAL HIGH (ref 70–99)
Glucose-Capillary: 157 mg/dL — ABNORMAL HIGH (ref 70–99)
Glucose-Capillary: 160 mg/dL — ABNORMAL HIGH (ref 70–99)
Glucose-Capillary: 174 mg/dL — ABNORMAL HIGH (ref 70–99)
Glucose-Capillary: 179 mg/dL — ABNORMAL HIGH (ref 70–99)
Glucose-Capillary: 190 mg/dL — ABNORMAL HIGH (ref 70–99)

## 2023-12-04 LAB — URINALYSIS, ROUTINE W REFLEX MICROSCOPIC
Bacteria, UA: NONE SEEN
Bilirubin Urine: NEGATIVE
Glucose, UA: NEGATIVE mg/dL
Ketones, ur: NEGATIVE mg/dL
Nitrite: NEGATIVE
Protein, ur: NEGATIVE mg/dL
Specific Gravity, Urine: 1.001 — ABNORMAL LOW (ref 1.005–1.030)
pH: 6 (ref 5.0–8.0)

## 2023-12-04 LAB — BASIC METABOLIC PANEL
Anion gap: 8 (ref 5–15)
BUN: <5 mg/dL — ABNORMAL LOW (ref 8–23)
CO2: 22 mmol/L (ref 22–32)
Calcium: 8.2 mg/dL — ABNORMAL LOW (ref 8.9–10.3)
Chloride: 101 mmol/L (ref 98–111)
Creatinine, Ser: 0.81 mg/dL (ref 0.44–1.00)
GFR, Estimated: >60 mL/min (ref >60)
Glucose, Bld: 137 mg/dL — ABNORMAL HIGH (ref 70–99)
Potassium: 4 mmol/L (ref 3.5–5.1)
Sodium: 131 mmol/L — ABNORMAL LOW (ref 135–145)

## 2023-12-04 LAB — OSMOLALITY, URINE: Osmolality, Ur: 131 mosm/kg — ABNORMAL LOW (ref 300–900)

## 2023-12-04 LAB — RENAL FUNCTION PANEL
Albumin: 2.8 g/dL — ABNORMAL LOW (ref 3.5–5.0)
Anion gap: 12 (ref 5–15)
BUN: <5 mg/dL — ABNORMAL LOW (ref 8–23)
CO2: 20 mmol/L — ABNORMAL LOW (ref 22–32)
Calcium: 8.4 mg/dL — ABNORMAL LOW (ref 8.9–10.3)
Chloride: 99 mmol/L (ref 98–111)
Creatinine, Ser: 0.78 mg/dL (ref 0.44–1.00)
GFR, Estimated: >60 mL/min (ref >60)
Glucose, Bld: 180 mg/dL — ABNORMAL HIGH (ref 70–99)
Phosphorus: 3 mg/dL (ref 2.5–4.6)
Potassium: 4.4 mmol/L (ref 3.5–5.1)
Sodium: 131 mmol/L — ABNORMAL LOW (ref 135–145)

## 2023-12-04 LAB — CBC
HCT: 38.9 % (ref 36.0–46.0)
Hemoglobin: 13.2 g/dL (ref 12.0–15.0)
MCH: 29.5 pg (ref 26.0–34.0)
MCHC: 33.9 g/dL (ref 30.0–36.0)
MCV: 87 fL (ref 80.0–100.0)
Platelets: 183 10*3/uL (ref 150–400)
RBC: 4.47 MIL/uL (ref 3.87–5.11)
RDW: 13.6 % (ref 11.5–15.5)
WBC: 10.7 10*3/uL — ABNORMAL HIGH (ref 4.0–10.5)
nRBC: 0 % (ref 0.0–0.2)

## 2023-12-04 LAB — PROTIME-INR
INR: 1 (ref 0.8–1.2)
Prothrombin Time: 13.3 s (ref 11.4–15.2)

## 2023-12-04 LAB — MAGNESIUM: Magnesium: 2.2 mg/dL (ref 1.7–2.4)

## 2023-12-04 LAB — FOLATE: Folate: 5.7 ng/mL — ABNORMAL LOW (ref >5.9)

## 2023-12-04 LAB — TSH: TSH: 3.156 u[IU]/mL (ref 0.350–4.500)

## 2023-12-04 MED ORDER — CLONAZEPAM 0.5 MG PO TABS
2.0000 mg | ORAL_TABLET | Freq: Every day | ORAL | Status: DC
  Administered 2023-12-05: 2 mg via ORAL
  Filled 2023-12-04 (×2): qty 2

## 2023-12-04 MED ORDER — ACETAMINOPHEN 325 MG PO TABS
650.0000 mg | ORAL_TABLET | Freq: Four times a day (QID) | ORAL | Status: DC | PRN
Start: 2023-12-04 — End: 2023-12-06
  Administered 2023-12-06: 650 mg
  Filled 2023-12-04: qty 2

## 2023-12-04 MED ORDER — NOREPINEPHRINE 4 MG/250ML-% IV SOLN
0.0000 ug/min | INTRAVENOUS | Status: DC
  Filled 2023-12-04: qty 250

## 2023-12-04 MED ORDER — CLONAZEPAM 0.5 MG PO TABS
1.0000 mg | ORAL_TABLET | Freq: Every day | ORAL | Status: DC
  Administered 2023-12-04 – 2023-12-06 (×3): 1 mg via ORAL
  Filled 2023-12-04: qty 1
  Filled 2023-12-04: qty 2
  Filled 2023-12-04: qty 1

## 2023-12-04 MED ORDER — NOREPINEPHRINE 4 MG/250ML-% IV SOLN
2.0000 ug/min | INTRAVENOUS | Status: DC
  Administered 2023-12-04: 2 ug/min via INTRAVENOUS
  Filled 2023-12-04: qty 250

## 2023-12-04 MED ORDER — STERILE WATER FOR INJECTION IJ SOLN
INTRAMUSCULAR | Status: AC
  Filled 2023-12-04: qty 10

## 2023-12-04 MED ORDER — ACETAMINOPHEN 650 MG RE SUPP
650.0000 mg | RECTAL | Status: DC | PRN
Start: 2023-12-04 — End: 2023-12-08
  Administered 2023-12-04: 650 mg via RECTAL
  Filled 2023-12-04: qty 1

## 2023-12-04 MED ORDER — SODIUM CHLORIDE 0.9 % IV SOLN: 250.0000 mL | INTRAVENOUS | Status: AC

## 2023-12-04 MED ORDER — FUROSEMIDE 10 MG/ML IJ SOLN
40.0000 mg | Freq: Four times a day (QID) | INTRAMUSCULAR | Status: AC
  Administered 2023-12-04 (×2): 40 mg via INTRAVENOUS
  Filled 2023-12-04 (×2): qty 4

## 2023-12-04 MED ORDER — OLANZAPINE 10 MG IM SOLR
5.0000 mg | Freq: Once | INTRAMUSCULAR | Status: AC
  Administered 2023-12-04: 5 mg via INTRAVENOUS
  Filled 2023-12-04 (×2): qty 10

## 2023-12-04 NOTE — TOC Progression Note (Addendum)
 Transition of Care [facility]) - Progression Note    Patient Details  Name: Amber Cruz MRN: [id number] Date of Birth: 1956-09-10  Transition of Care [facility]) CM/SW Contact  Nicanor Bake Phone Number: [phone] 12/04/2023, 1:19 PM  Clinical Narrative:   HF CSW received verbal consult in relation to pts diagnosis. SA resources are located on the AVS. CSW will also provide in hand SA resources for pt.  2:58 PM- HF CSW attempted to meet with pt at bedside. Pt was being seen by medical staff and had family at bedside. CSW left pts SA resource packet in her chart.    TOC will continue following.          Expected Discharge Plan and Services                                               Social Determinants of Health (SDOH) Interventions SDOH Screenings   Food Insecurity: Low Risk  (10/13/2022)   Received from [facility]  Housing: Low Risk  (10/13/2022)   Received from [facility]  Utilities: Low Risk  (10/13/2022)   Received from [facility] visits prior to 11/26/2022., [facility]  Financial Resource Strain: Low Risk  (04/21/2022)   Received from [facility] visits prior to 11/26/2022., [facility]  Physical Activity: Insufficiently Active (04/21/2022)   Received from [facility] visits prior to 11/26/2022., [facility]  Social Connections: Socially Integrated (04/21/2022)   [facility] from [facility] visits prior to 11/26/2022., [facility]  Stress: No Stress Concern Present (04/21/2022)   Received from [facility] visits prior to 11/26/2022., [facility]  Tobacco Use: Low Risk  (12/01/2023)    Readmission Risk Interventions     No data to display

## 2023-12-04 NOTE — Progress Notes (Addendum)
 NAME:  Amber Cruz, MRN:  [id number], DOB:  07/02/1956, LOS: 3 ADMISSION DATE:  12/01/2023, CONSULTATION DATE:  12/01/23 REFERRING MD:  Eloise Harman , CHIEF COMPLAINT:  AMS   History of Present Illness:  68 yo F PMH MDD, Anxiety, memory issues (family is describing what sounds like possible dementia) VWB disease, chronic pain / hemicrani continua who was BIB EMS 3/7 with decr LOC. Last seen this morning around 7. Family members had tried calling/texting from about 0830 onward and she did not answer/reply, which was unusual, prompting police welfare check. She was found w GCS 3, HR 40s, SBP 70s, and received narcan and glucagon without great improvement. Reportedly some pink pills on the ground at home and it is unclear what these are, and was also found with bottle of hydrocodone, labetalol, and bottle labeled lorazepam (though contents may not be lorazepam) as well as bottle ace Ace (Rx to dog, later it was determined that she did not take any of these). Per husband it sounds like she may be swapping meds with a friend, has been taking Zoloft the last few nights which sounds like her friend's Rx   In ED pt was intubated and started on propofol, received succ. She received atropine followed by epi gtt.    Poison control was engaged and rec labs, VS optimization, serial EKG   PCCM is called for admission in this setting   Pertinent  Medical History  MDD Anxiety VWB dz  Significant Hospital Events: Including procedures, antibiotic start and stop dates in addition to other pertinent events   3/7 admit to ICU after OD, intubated, HIET, epi gtt 3/8 extubated, off high dose insulin overnight, weaning epi, diuresed 3/9 CBGs remain in 70's despite D10 at 300's/ hr. Lasix overnight and HHFNC for WOB, remains net +15L 3/10 Remains on Epi drip and HHFNC at 20L   Interim History / Subjective:  Seen on 4 point restrains, abel to state name and DOB   Objective   Blood pressure (!) 88/39, pulse 69,  temperature 98.6 F (37 C), temperature source Oral, resp. rate 20, height 5\' 6"  (1.676 m), weight 104.6 kg, SpO2 92%.    FiO2 (%):  [48 %-57 %] 48 %   Intake/Output Summary (Last 24 hours) at 12/04/2023 0839 Last data filed at 12/04/2023 4098 Gross per 24 hour  Intake 4999.91 ml  Output 11914 ml  Net -9050.09 ml   Filed Weights   12/01/23 2100 12/02/23 0500 12/04/23 0701  Weight: 95.3 kg 106.8 kg 104.6 kg   Examination: General: Acute on chronic deconditioned elderly female lying in bed in 4 point restraints in no acute distress HEENT: [place]/AT, MM pink/moist, PERRL,  Neuro: Alert to self only, seen moving all extremities spontaneously CV: s1s2 regular rate and rhythm, no murmur, rubs, or gallops,  PULM: Diminished bilaterally, no increased work of breathing, no added breath sounds, remains on HHFNC at 20 L GI: soft, bowel sounds active in all 4 quadrants, non-tender, non-distended Extremities: warm/dry, no edema  Skin: no rashes or lesions  Resolved Hospital Problem list     Assessment & Plan:  Acute toxic metabolic encephalopathy -In context of presumed polysubstance overdose; opiate and benzo on UDS (not Rx to her) -Agitation continues to remain severe especially in the evenings.  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 Longstanding depression and anxiety with prior SI P: Psychiatry following, appreciate assistance Minimize sedation as able, try to avoid polypharmacy Delirium precautions When safe mobilize Frequent neurochecks  Shock state, bradycardia 2/2 suspected intentional OD, possible BB/ CCB  -Refractory to epi, query overlying vagal hyperactivity P: Remains on Epi drip at wean  Continuous telemetry Optimize electrolytes Monitor intake and output  Hypoglycemia 2/2  high dose insulin/ NPO -resolved P: No longer requiring insulin drip  Hypo-K/Mg/Phos -resolved P: Trend bmet  Hypervolemia  -Significant urine output of 13 L in the last 24 hours, however patient remains 7 L positive for admission P:  Closely monitor volume status given high output urine Check urine osmolarity Check specific gravity Continue Foley Follow electrolytes   Best Practice (right click and "Reselect all SmartList Selections" daily)   Diet/type: NPO pending bedside SLP DVT prophylaxis prophylactic heparin  Pressure ulcer(s): none GI prophylaxis: N/A Lines: Central line Foley:  Yes, and it is still needed Code Status:  DNR Last date of multidisciplinary goals of care discussion:  CRITICAL CARE Performed by: Clell Trahan D. Harris  Total critical care time: 40 minutes  Critical care time was exclusive of separately billable procedures and treating other patients.  Critical care was necessary to treat or prevent imminent or life-threatening deterioration.  Critical care was time spent personally by me on the following activities: development of treatment plan with patient and/or surrogate as well as nursing, discussions with consultants, evaluation of patient's response to treatment, examination of patient, obtaining history from patient or surrogate, ordering and performing treatments and interventions, ordering and review of laboratory studies, ordering and review of radiographic studies, pulse oximetry and re-evaluation of patient's condition.  Meaghann Choo D. Harris, NP-C [facility] Personal contact information can be found on Amion  If no contact or response made please call [phone] 12/04/2023, 9:36 AM

## 2023-12-04 NOTE — Consult Note (Addendum)
 [facility] Follow-up  Patient Name: .Amber Cruz  MRN: [id number]  DOB: 09/07/1956  Consult Order details:  Orders (From admission, onward)     Start     Ordered   12/02/23 1851  IP CONSULT TO PSYCHIATRY       Ordering Provider: Lorin Glass, MD  Provider:  (Not yet assigned)  Question Answer Comment  Location [facility]   Reason for Consult? suicide attempt      12/02/23 1850             Mode of Visit: In person    Psychiatry Consult Evaluation  Service Date: December 04, 2023 LOS:  LOS: 3 days  Chief Complaint "polysubstance overdose in a suicide attempt"  Primary Psychiatric Diagnoses  Major depressive disorder, recurrent, severe 2.  Generalized Anxiety Disorder   Assessment  Amber Cruz is a 68 y.o. female admitted: Medicallyfor 12/01/2023  2:45 PM with loss of consciousness after overdose. 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 Psychiatry consulted for suicide attempt.  Her current presentation of recent multiple substance overdose and ongoing depressive symptoms is most consistent with major depressive disorder, recurrent, severe. She meets criteria for inpatient admission based on recent overdose, being danger to herself and poorly treated depression due to medication non complaint. Current outpatient psychotropic medications include Zoloft 50 mg daily and propranolol 10 mg PRN for anxiety but unsure if this medications are working.She was not compliant with medications prior to admission as evidenced by reports from husband and daughter. On initial examination, patient is awake but altered, she is disoriented to time, place, person and situation. Please see plan below for detailed recommendations.   Poor historian:  On evaluation patient is drowsy and disoriented, calm and cooperative, yet  restless.  The patient, a retired Armed forces operational officer, was assessed with her daughter, Amber Cruz, present at the bedside. Despite being disoriented, the patient was able to identify her daughter and reported having one child and three grandchildren. She expressed recent mood changes over the past couple of days, which led to suicidal thoughts, although she denies any history of suicide attempts prior to this admission. The patient was brought to the hospital by her uncle for anxiety and admits to not being forthcoming about taking pills, but does confirm intent to harm herself. She denies experiencing mania, instead describing her mood as low, with symptoms of decreased motivation and loss of interest. Her appetite and sleep have remained stable. The patient has a diagnosis of bipolar disorder and anxiety, for which she was previously prescribed Zoloft and Ativan. Upon assessment, she appeared alert but disoriented, unable to identify the current month or year, stating it was March and the year was 1296. She also struggled to identify the president, responding, "Do I have to say it?" However, she could recall her name, date of birth, and that she was in the hospital, though she inconsistently named the hospital as "[facility]" before correcting herself to "[facility]." While able to engage in linear thoughts and conversation, her disorientation and confusion persisted throughout the assessment. The patient denies current suicidal ideation but acknowledges that these thoughts began in the past few days. Given her disorientation, mood changes, and reported mental health history, it is likely that her symptoms are related to her underlying conditions, including possible medication factors or situational stressors. Further evaluation and monitoring are needed for her  bipolar disorder and anxiety symptoms. Discussed with nurse at bedside, to trial restraint period off as patient currently was alert and oriented x 2, and  daughter was at bedside. He removed two LE restraints.   Diagnoses:  Active Hospital problems: Principal Problem:   Overdose Active Problems:   DNR (do not resuscitate)    Plan   ## Psychiatric Medication Recommendations:  Hold all medications for now We recommend transferring patient to inpatient psychiatric unit once she is medically cleared Consider TOC/Social worker consult to facilitate inpatient transfer.  ## Medical Decision Making Capacity: Not specifically addressed in this encounter  ## Further Work-up:  TSH, B12, folate -- most recent EKG on 12/03/2023 had QtC of 400 -- Pertinent labwork reviewed earlier this admission includes: Na-121, K-3.4, Ca-7.8   ## Disposition:-- We recommend inpatient psychiatric hospitalization when medically cleared. Patient is under voluntary admission status at this time; please IVC if attempts to leave hospital.  ## Behavioral / Environmental: -Delirium Precautions: Delirium Interventions for Nursing and Staff: - RN to open blinds every AM. - To Bedside: Glasses, hearing aide, and pt's own shoes. Make available to patients. when possible and encourage use. - Encourage po fluids when appropriate, keep fluids within reach. - OOB to chair with meals. - Passive ROM exercises to all extremities with AM & PM care. - RN to assess orientation to person, time and place QAM and PRN. - Recommend extended visitation hours with familiar family/friends as feasible. - Staff to minimize disturbances at night. Turn off television when pt asleep or when not in use.    ## Safety and Observation Level:  - Based on my clinical evaluation, I estimate the patient to be at high risk of self harm in the current setting. - At this time, we recommend  1:1 Observation. This decision is based on my review of the chart including patient's history and current presentation, interview of the patient, mental status examination, and consideration of suicide risk including  evaluating suicidal ideation, plan, intent, suicidal or self-harm behaviors, risk factors, and protective factors. This judgment is based on our ability to directly address suicide risk, implement suicide prevention strategies, and develop a safety plan while the patient is in the clinical setting. Please contact our team if there is a concern that risk level has changed.  CSSR Risk Category:   Suicide Risk Assessment: Patient has following modifiable risk factors for suicide: active suicidal ideation, under treated depression , and medication noncompliance, which we are addressing by monitoring with the plan to transfer to psychiatric inpatient. Patient has following non-modifiable or demographic risk factors for suicide: history of suicide attempt, history of self harm behavior, and psychiatric hospitalization Patient has the following protective factors against suicide: Supportive family  Thank you for this consult request. Recommendations have been communicated to the primary team.  We will follow up at this time.   Maryagnes Amos, FNP       History of Present Illness  Relevant Aspects of [facility] Course:  Admitted on 12/01/2023 for loss of consciousness.   Patient Report:   The patient was seen face-to-face in her hospital room, awake but significantly altered and disoriented to time, place, person, and situation. Due to her altered mental state, there were long pauses in between but she would answer although it maybe inappropriate.  The patient is currently prescribed Zoloft 50 mg daily and Propranolol 10 mg PRN by a psychiatry NP via telehealth at [facility], but has not  been compliant with her medications. She has a history of discontinuing medications prematurely, often claiming they are ineffective without giving them adequate time to take effect.  Collateral information:  Information obtained from patient's daughter and husband.  Amber Cruz (daughter)  reports her mother has been married for 16 years. Has a history of mental illness that continued on from childhood though college. She reports as a child her mother would take pills and be " out for days". She reports that patient husband works, and she had been sending messages and then stopped responding so he had a feeling and called the [facility]. " She reports on Thursday " mom was ok, then frantic, then crying, and very scared. She doesn't like to be left alone." She reports that her grandmother passed away in 2011/12/29. She reports her aunt attempted suicide by overdose. Daughter reports that her mental health and memory have declined, and has become more forgetful in the past few years. She has some safety concerns and is in agreeance with inpatient psychiatric stabilization once medically stable.   Review of Systems  Psychiatric/Behavioral:  Positive for depression and suicidal ideas.      Psychiatric and Social History  Psychiatric History:  Information collected from husband and daughter   Prev Dx/Sx: MDD/GAD Current Psych Provider: NP at [facility] Meds (current): Zoloft 50 mg daily and Propranolol 10 mg PRN Previous Med Trials: Most SSRI/SNRI-unsure.  Therapy: denies   Prior Psych Hospitalization: yes, but more than 35 years ago due to suicide attempt.   Prior Self Harm: yes Prior Violence: denies   Family Psych History: denies  Family Hx suicide: denies   Social History:  Educational Hx: high school graduate and some certification Occupational Hx: In the process of retiring.  Legal Hx: denies  Living Situation: Lives with husband of 16 years.  Spiritual Hx: unknwn  Access to weapons/lethal means: denies    Substance History Alcohol: socially  Type of alcohol wine mostly Last Drink weeks ago Number of drinks per day unknown  History of alcohol withdrawal seizures denies  History of DT's denies  Tobacco: denies  Illicit drugs: denies   Prescription drug abuse: denies  Rehab hx: denies   Exam Findings  Physical Exam:  Vital Signs:  Temp:  [98.1 F (36.7 C)-101.2 F (38.4 C)] 98.8 F (37.1 C) (03/10 1200) Pulse Rate:  [59-96] 74 (03/10 1230) Resp:  [12-28] 20 (03/10 1230) BP: (83-129)/(32-105) 104/48 (03/10 1230) SpO2:  [82 %-100 %] 93 % (03/10 1230) FiO2 (%):  [48 %-57 %] 48 % (03/10 0753) Weight:  [104.6 kg] 104.6 kg (03/10 0701) Blood pressure (!) 104/48, pulse 74, temperature 98.8 F (37.1 C), temperature source Oral, resp. rate 20, height 5\' 6"  (1.676 m), weight 104.6 kg, SpO2 93%. Body mass index is 37.22 kg/m.  Physical Exam Vitals and nursing note reviewed.  Constitutional:      Appearance: Normal appearance. She is normal weight.  HENT:     Head: Normocephalic.  Musculoskeletal:     Comments: Noted to be in 4 point restraints  Neurological:     General: No focal deficit present.     Mental Status: She is alert and oriented to person, place, and time. Mental status is at baseline.  Psychiatric:        Attention and Perception: She is inattentive.        Mood and Affect: Mood is depressed.        Speech: Speech is delayed.  Behavior: Behavior is slowed. Behavior is cooperative.        Thought Content: Thought content includes suicidal ideation. Thought content includes suicidal plan.        Cognition and Memory: Cognition is impaired. Memory is impaired. She exhibits impaired recent memory.        Judgment: Judgment is inappropriate.     Mental Status Exam: General Appearance: Casual  Orientation:  Negative  Memory:  Immediate;   Poor  Concentration:  Concentration: Poor and Attention Span: Poor  Recall:  Poor  Attention  Poor  Eye Contact:  Minimal  Speech:  Clear and Coherent and Slow  Language:  Poor  Volume:  Decreased  Mood: "depressed"  Affect:  Depressed and Flat  Thought Process:  unable to assess  Thought Content:  Illogical and Paranoid Ideation  Suicidal Thoughts:    Denies today  Homicidal Thoughts:  No  Judgement:  Poor  Insight:  Shallow  Psychomotor Activity:  Decreased  Akathisia:  No  Fund of Knowledge:   unable to assess      Assets:  Housing Social Support  Cognition:  Impaired,  Mild  ADL's:  Intact  AIMS (if indicated):        Other History   These have been pulled in through the EMR, reviewed, and updated if appropriate.  Family History:  The patient's family history includes Alzheimer's disease in her mother; Heart failure in her father; Hypertension in her father.  Medical History: Past Medical History:  Diagnosis Date   Aneurysm (HCC)    Anxiety    Frequent infections of left ear    Thyroid disease     Surgical History: Past Surgical History:  Procedure Laterality Date   DESCENDING AORTIC ANEURYSM REPAIR W/ STENT     MYRINGOTOMY Left 2018     Medications:   Current Facility-Administered Medications:    acetaminophen (TYLENOL) suppository 650 mg, 650 mg, Rectal, Q4H PRN, Paliwal, Aditya, MD, 650 mg at 12/04/23 0113   acetaminophen (TYLENOL) tablet 650 mg, 650 mg, Per Tube, Q6H PRN, Paliwal, Aditya, MD   Chlorhexidine Gluconate Cloth 2 % PADS 6 each, 6 each, Topical, Daily, Agarwala, Ravi, MD, 6 each at 12/04/23 1025   clonazePAM (KLONOPIN) tablet 1 mg, 1 mg, Oral, Daily, Lorin Glass, MD, 1 mg at 12/04/23 1213   clonazePAM (KLONOPIN) tablet 2 mg, 2 mg, Oral, QHS, Lorin Glass, MD   docusate (COLACE) 50 MG/5ML liquid 100 mg, 100 mg, Per Tube, BID PRN, Leander Rams, RPH   EPINEPHrine (ADRENALIN) 5 mg in NS 250 mL (0.02 mg/mL) premix infusion, 0.5-40 mcg/min, Intravenous, Titrated, Gaetana Michaelis, MD, Last Rate: 27 mL/hr at 12/04/23 0800, 9 mcg/min at 12/04/23 0800   furosemide (LASIX) injection 40 mg, 40 mg, Intravenous, Q6H, Lorin Glass, MD, 40 mg at 12/04/23 1223   heparin injection 5,000 Units, 5,000 Units, Subcutaneous, Q8H, Cloyd Stagers M, PA-C, 5,000 Units at 12/04/23 1324   LORazepam (ATIVAN)  injection 1 mg, 1 mg, Intravenous, Once, Paliwal, Aditya, MD   LORazepam (ATIVAN) injection 1-2 mg, 1-2 mg, Intravenous, Q4H PRN, Lorin Glass, MD, 2 mg at 12/04/23 1208   polyethylene glycol (MIRALAX / GLYCOLAX) packet 17 g, 17 g, Per Tube, Daily PRN, Cloyd Stagers M, PA-C   sodium chloride flush (NS) 0.9 % injection 10-40 mL, 10-40 mL, Intracatheter, Q12H, Agarwala, Ravi, MD, 20 mL at 12/04/23 1025   sodium chloride flush (NS) 0.9 % injection 10-40 mL, 10-40 mL, Intracatheter, PRN,  Lynnell Catalan, MD  Allergies: Allergies  Allergen Reactions   Iodinated Contrast Media Itching and Rash    CT scan done w/ IV dye on 06/08/12. Per pt / [facility] pt needs to be pre-medicated if needs future CT scans w/IV contrast.  CT scan done w/ IV dye on 06/08/12. Per pt / [facility] pt needs to be pre-medicated if needs future CT scans w/IV contrast.     Ambien [Zolpidem] Other (See Comments)    Drove vehicle without knowledge of driving. From [place], [place]   Prochlorperazine Other (See Comments)   Honey Bee Venom Protein [place] April Bee Venom]    Iodides     Maryagnes Amos, FNP

## 2023-12-04 NOTE — Progress Notes (Addendum)
 eLink Physician-Brief Progress Note Patient Name: Amber Cruz DOB: 04-26-1956 MRN: [id number]   Date of Service  12/04/2023  HPI/Events of Note  68 yo F PMH MDD, Anxiety, memory issues (family is describing what sounds like possible dementia) VWB disease, chronic pain / hemicrani continua who was BIB EMS 3/7 with decr LOC.   Blood pressure trending on the lower side with wide pulse pressures.  MAP of 50.  Epi turned off earlier in the day.  eICU Interventions  Can check lactate with a.m. labs.  Will add norepi with systolic goal greater than 90.   1955 -repeated restlessness despite Ativan.  Had moderate effect with Zyprexa.  Will repeat Zyprexa now  2214 -unable to take scheduled Klonopin due to ongoing agitation and bedside concern for inability to swallow.  Continue monitoring.  Intervention Category Intermediate Interventions: Hypotension - evaluation and management  Amber Cruz 12/04/2023, 7:42 PM

## 2023-12-04 NOTE — Discharge Instructions (Signed)
 In a time of Crisis:   - [facility]   Mobile Crisis Management provides immediate crisis response, 24/7.   Call [phone]   [facility] for MH/DD/SA Services?Access Center is available 24 hours a day, 7 days a week. Customer Service Specialists will assist you to find a crisis provider that is well-matched with your needs. Your local number is:?[phone]   - [facility]/Behavioral Health Urgent Care [facility])  OP, individual counseling, medication management  [address] [place], [place] [address]  ; [phone]  Call for intake hours; Medicaid and Uninsured     Outpatient Providers    [facility] ([facility])  Group and individual counseling.  [address]   [place], [place] [address]  [phone]  [place]: [phone]  [place]: [phone]  Medicaid and uninsured.     [facility]  Offers IOP groups multiple times per week.  [address] [place], [place] [address]  [phone]  Takes Medicaid and other insurances.     [facility]   Chemical Dependency Intensive Outpatient Program (IOP)  [address]  [place], [place] [address]  [phone]  Takes Nurse, learning disability and [place].     [facility]   IOP and Partial Hospitalization Program   [address]   [place], [place] [address]  [phone]  Private Insurance, [place] only for partial hospitalization    [facility]  [address], [address], [place], [place] [address]  [phone]  Monday-Friday. Short and Long term options.  [facility]gent Care ([facility])  IOP, individual counseling, medication management  [address] [place], [place] [address]  [phone]  Medicaid and [facility] - Day One Residence    [facility]  [address]   [place], [place] [address]  [phone]  Private Insurance and Self Pay      [facility]  [address]   [place], [place] [address]  [phone]  Private Insurance, [place], and Self Pay     [facility]   [address] [place], [place] [address]  [facility]   [address]   [place], [place] [address]  [phone]    [facility]   [address]  [place], [place] [address]  [phone]          Residential Treatment Programs    [facility]  [address]  [place], [place] [address]  [phone] or [phone]  Detox and Residential Rehab 21 days  (Medicaid, private insurance, and self pay. If Medicare, will look into funding). No methadone. Call for pre-screen.     [facility])  [address]   [place], [place] [address]  [phone]  Detox 3-7 days (self Pay and Medicaid Limited availability). Transitional Program for females needs 60 days clean first.   Rehab Only for Males (Medicare, Medicaid, and Self Pay)-No methadone.    [facility] [address]  [address]  [place], [place] [address]  [phone] or [phone]  Private Insurance only    [facility]  PHONE:?[phone]  FAX:?[phone]  Residential program for women 21 and over for up to a year through a Christian 12-step recovery model.  Self-pay.      [facility]  [address]  [place], [place]  [address]  Phone: ?[phone]  Must be detoxed 72 hours prior to admission; 28 day program.   Self-pay.    [facility]  [address]   [place], [place]  [phone]  [employer], Medicare, [place] (not straight [place]). They offer assistance with transportation.     [facility]  [address] [place],   [place], [place] [address]  [phone]  Christian Based Program. Men only.  No insurance    [facility] is a substance use disorder treatment program for women,  including those who are pregnant, parenting, and/or whose lives have been touched by abuse and violence.  [phone]    [facility]  [address] [place], [place] [address]  Women's: [phone]  Men's: [phone]  No Medicaid.     [facility] [place]  Locations across the [place]mainly [place]) willing to help with transportation.   [phone] [employer].  [facility]   [address]   [place], [place] [address]  [phone]  Treatment Only, must make assessment appointment, and must be sober for assessment appointment. Self pay, [facility], must be [facility] resident. No methadone.     [facility]   [address]  [place], [place] [address]  [phone]  No pending legal charges, Long-term work program. No methadone. Call for assessment.    [facility]   [address],  [place], [place] [address]  [phone] or [phone]  Commercial Insurance Only    [facility]  Local - [phone]  [phone]  Private Insurance (no [place]).  Males/Females, call to make referrals, multiple facilities.     [facility]  [address],   [place], [place] [address]  ?([place]  Men Only Upfront Fee  [facility]  Women's Program: [facility]  [address]  [place], [place] [address]  [phone]    [facility]-no methadone:  [facility]  [address]  [place], [place] [address]  [phone] (f)    [facility]  [address] [place], [place] [address]  [phone] [facility]  [phone]  [place], [place]  For women, houses 8 residents for sober living. No Medicaid.               AA Meetings   Meeting Locator:  [email or web address]  Also can download an app on that website.     Syringe Services Program   Due to COVID-19, syringe services programs are likely operating under different hours with limited or no fixed site hours. Some programs may not be operating at all. Please contact the program directly using the phone numbers provided below to see if they are still operating under COVID-19.   [facility] Solution to the Opioid Problem (GCSTOP)  Fixed; mobile; peer-based  Roxy Cedar  [phone]  [email or web address]   Fixed site exchange at [facility], [address]  [place], [place] [address] on Wednesdays (2:00 - 5:00 pm) and Thursdays (4:00 - 8:00 pm).  Pop-up mobile exchange locations:  [employer], [address] [place], [place] [address] on Tuesdays (11:00 am - 1:00 pm) and Fridays (11:00 am - 1:00 pm)   -[facility]?-?[address], [place], [place] [address] on Tuesdays (2:00 - 4:00 pm) and Fridays (  2:00 - 4:00 pm)   -[facility]?- also serves [place] and [employer]  [facility] [employer]  Fixed; mobile; peer-based  Lendon Ka  [phone]  [email or web address]  [address] [place], [place] [address]  Delivery and outreach available in [place] and [place], please call for more information. Monday, Tuesday: 1:00 -7:00 pm  Thursday: 4:00 pm - 8:00 pm  Friday: 1:00 pm - 8:00 pm)   Medication-Assisted Treatment (MAT)   -[facility]- services [address] and surrounding areas including [place], [place], [place], [address], [place], [place], [place], [place], and [place], [place].   Options include Methadone, buprenorphine or Suboxone.   [address], [place] [address]  [place], [place] [address]  Phone: [phone]   Mon - Fri: 5:30am - 2:00pm  Sat: 5:30am -7:30am  Sun: Closed  Holidays: 6:00am - 8:00am    -[facility]- We use FDA-approved medications, like methadone/suboxone/sublocade, and vivitrol. These  medications are then combined with customized care plans that include individual or group counseling, toxicology, and medical care directed by on-site physicians. Accepts most insurance plans, Medicaid, and private pay.    [address]  [place], [place] [address]  Phone:?[phone]   Monday-Friday 5:00 AM - 10:00 AM  Saturday  6:00 AM - 8:30 AM  Sunday 6:00 AM - 7:00 AM    -[facility]- [facility] is a treatment & recovery focused program. In addition to receiving methadone medication, our clients participate in individual and group counseling as well as random drug testing. If accepted into the [facility] Opioid Program, you will be provided several intake appointments and a physical exam   [address]  [place], [place] [address]  Office: [phone]  Fax: [phone]     -[facility]- We put our community members at the center of everything we do, for remote treatment services as well as in-person, from alcohol withdrawal to opioid use and more.?   [address], [place], [place] [address]  [phone]   Monday-Wednesday:?9:00am - 5:00pm  Thursday:?9:00am - 6:00pm  Friday:?9:00am - 5:00pm  Saturday:?9:00am - 1:00pm  Sunday:?Closed      -[facility] [place]- Our clinic in [place], a medication unit, offers daily dosing of methadone or buprenorphine in addition to our clinic in [place] offering counseling to help people overcome addiction to heroin and other opiates. We also offer psychiatric services including medication management, and an office based suboxone program.   [address], [place], [place] [address]  Phone [phone]    [facility]-We treat Opioid Addiction using medications that are a combination of buprenorphine and naloxone, which are used to treat adults addicted to narcotic painkillers and drugs such as heroin. They reduce the intense cravings and painful symptoms that accompany  withdrawal.   [address], [place], [place]  Phone: [phone]    [facility])  [address] [place], [place] [address]  [phone]   [place]  [phone]  [address] [place], [place] [address]   M-W??? 5:00am-12:00pm  Thu???? 5:00am-10:00am  Fri?????? 5:00am-12:00pm  Sat????? 5:00am-8:00am  Sun???? Closed   $12/daily for Methadone Treatment.

## 2023-12-05 DIAGNOSIS — R4189 Other symptoms and signs involving cognitive functions and awareness: Secondary | ICD-10-CM | POA: Diagnosis not present

## 2023-12-05 DIAGNOSIS — G928 Other toxic encephalopathy: Secondary | ICD-10-CM | POA: Diagnosis not present

## 2023-12-05 LAB — RENAL FUNCTION PANEL
Albumin: 2.9 g/dL — ABNORMAL LOW (ref 3.5–5.0)
Anion gap: 3 — ABNORMAL LOW (ref 5–15)
BUN: 10 mg/dL (ref 8–23)
CO2: 35 mmol/L — ABNORMAL HIGH (ref 22–32)
Calcium: 8.9 mg/dL (ref 8.9–10.3)
Chloride: 103 mmol/L (ref 98–111)
Creatinine, Ser: 0.79 mg/dL (ref 0.44–1.00)
GFR, Estimated: >60 mL/min (ref >60)
Glucose, Bld: 123 mg/dL — ABNORMAL HIGH (ref 70–99)
Phosphorus: 3.3 mg/dL (ref 2.5–4.6)
Potassium: 3.7 mmol/L (ref 3.5–5.1)
Sodium: 141 mmol/L (ref 135–145)

## 2023-12-05 LAB — GLUCOSE, CAPILLARY
Glucose-Capillary: 123 mg/dL — ABNORMAL HIGH (ref 70–99)
Glucose-Capillary: 127 mg/dL — ABNORMAL HIGH (ref 70–99)
Glucose-Capillary: 152 mg/dL — ABNORMAL HIGH (ref 70–99)
Glucose-Capillary: 161 mg/dL — ABNORMAL HIGH (ref 70–99)
Glucose-Capillary: 170 mg/dL — ABNORMAL HIGH (ref 70–99)

## 2023-12-05 LAB — LACTIC ACID, PLASMA: Lactic Acid, Venous: 0.9 mmol/L (ref 0.5–1.9)

## 2023-12-05 MED ORDER — ORAL CARE MOUTH RINSE: 15.0000 mL | OROMUCOSAL | Status: DC | PRN

## 2023-12-05 MED ORDER — OLANZAPINE 10 MG IM SOLR
2.5000 mg | Freq: Two times a day (BID) | INTRAMUSCULAR | Status: DC | PRN
Start: 2023-12-05 — End: 2023-12-06
  Administered 2023-12-05: 2.5 mg via INTRAVENOUS
  Filled 2023-12-05 (×2): qty 10

## 2023-12-05 MED ORDER — POTASSIUM CHLORIDE CRYS ER 20 MEQ PO TBCR
20.0000 meq | EXTENDED_RELEASE_TABLET | Freq: Once | ORAL | Status: AC
  Administered 2023-12-05: 20 meq via ORAL
  Filled 2023-12-05: qty 1

## 2023-12-05 MED ORDER — STERILE WATER FOR INJECTION IJ SOLN
INTRAMUSCULAR | Status: AC
Start: 2023-12-05 — End: 2023-12-05
  Filled 2023-12-05: qty 10

## 2023-12-05 NOTE — Evaluation (Signed)
 Physical Therapy Evaluation Patient Details Name: Amber Cruz MRN: [id number] DOB: 09/29/1955 Today's Date: 12/05/2023  History of Present Illness  68 yo F who was BIB EMS 3/7 with decr LOC due to polysubstance overdose in suicide attempt. PMH: major depressive disorder, anxiety, memory issues (family is describing what sounds like possible dementia) VWB disease, chronic pain, hemicrania continua   Clinical Impression  Pt admitted with above. Per dtr PTA pt was starting to have a decline functionally requiring more assist as well as memory deficits. Pt presenting with lethargy and confusion but more alert once EOB. Pt demo's impaired cognition, balance, coordination requiring modA for mobility with max directional cues. Aware the recommendation is for inpatient psych unit admission once medically stable however unsure what level of function pt needs to be at. Pt may benefit from aggressive inpatient rehab program of > 3 hrs to achieve required level of function to attend behavioral health. Acute PT to cont to follow.      If plan is discharge home, recommend the following: A lot of help with walking and/or transfers;A lot of help with bathing/dressing/bathroom;Assist for transportation;Help with stairs or ramp for entrance;Supervision due to cognitive status   Can travel by private vehicle        Equipment Recommendations  (TBD)  Recommendations for Other Services  Rehab consult    Functional Status Assessment Patient has had a recent decline in their functional status and demonstrates the ability to make significant improvements in function in a reasonable and predictable amount of time.     Precautions / Restrictions Precautions Precautions: Fall Restrictions Weight Bearing Restrictions Per Provider Order: No      Mobility  Bed Mobility Overal bed mobility: Needs Assistance Bed Mobility: Supine to Sit     Supine to sit: Mod assist     General bed mobility comments: HOB  elevated, pt initiated LE movement to EOB, modA for trunk elevation and to scoot to EOB, increased time    Transfers Overall transfer level: Needs assistance Equipment used: 1 person hand held assist (face to face transfer) Transfers: Sit to/from Stand, Bed to chair/wheelchair/BSC Sit to Stand: Mod assist, +2 safety/equipment   Step pivot transfers: Mod assist, +2 safety/equipment       General transfer comment: pt with posterior bias with constant v/c's to "lean forward", pt requiring assist for weight shifting to sequence stepping to chair, pt with minimal step height, more shuffle like gait pattern, max directional verbal cues    Ambulation/Gait               General Gait Details: unable to safely ambulate this date  Stairs            Wheelchair Mobility     Tilt Bed    Modified Rankin (Stroke Patients Only)       Balance Overall balance assessment: Needs assistance Sitting-balance support: Feet supported, Bilateral upper extremity supported Sitting balance-Leahy Scale: Poor Sitting balance - Comments: pt initially requiring modA due to posterior bias with inability to self correct, progressing to contact guard s/p 5 min of sitting   Standing balance support: During functional activity, Reliant on assistive device for balance, Bilateral upper extremity supported Standing balance-Leahy Scale: Poor Standing balance comment: dependent on external support                             Pertinent Vitals/Pain Pain Assessment Pain Assessment: No/denies pain    Home Living Family/patient  expects to be discharged to:: Other (Comment)                   Additional Comments: plan for patient to be admitted to behavioral health/inpatient psych unit    Prior Function Prior Level of Function : Needs assist;Working/employed             Mobility Comments: daughter reports decline in mobility over the last month since she was sick with the flu  and PNA beginning of february, 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 ADLs Comments: indep     Extremity/Trunk Assessment   Upper Extremity Assessment Upper Extremity Assessment: Generalized weakness (pt with 5/5 strength however impaired coordination)    Lower Extremity Assessment Lower Extremity Assessment: Generalized weakness (pt with 5/5 strength however impaired coordination)    Cervical / Trunk Assessment Cervical / Trunk Assessment: Normal  Communication   Communication Communication: No apparent difficulties    Cognition Arousal: Lethargic, Suspect due to medications (sleepy, received clonopin) Behavior During Therapy: WFL for tasks assessed/performed   PT - Cognitive impairments: Orientation, Awareness, Memory, Attention, Safety/Judgement, Sequencing, Problem solving   Orientation impairments: Time, Place, Situation                   PT - Cognition Comments: pt only oriented to self. Pt sleepy but more awake once EOB. Per daughter pt with memory deficits and progressive forgetfulness over the last few months Following commands: Impaired (requiring multimodal cues and increased time) Following commands impaired: Follows one step commands inconsistently, Follows one step commands with increased time     Cueing Cueing Techniques: Verbal cues, Tactile cues     General Comments General comments (skin integrity, edema, etc.): VSS    Exercises     Assessment/Plan    PT Assessment Patient needs continued PT services  PT Problem List Decreased strength;Decreased activity tolerance;Decreased balance;Decreased mobility;Decreased coordination;Decreased cognition;Decreased knowledge of use of DME;Decreased safety awareness       PT Treatment Interventions DME instruction;Gait training;Stair training;Functional mobility training;Therapeutic activities;Therapeutic exercise;Balance training;Neuromuscular re-education;Cognitive  remediation;Patient/family education    PT Goals (Current goals can be found in the Care Plan section)  Acute Rehab PT Goals Patient Stated Goal: didn't state PT Goal Formulation: With family Time For Goal Achievement: 12/19/23 Potential to Achieve Goals: Fair    Frequency Min 3X/week     Co-evaluation               AM-PAC PT "6 Clicks" Mobility  Outcome Measure Help needed turning from your back to your side while in a flat bed without using bedrails?: A Little Help needed moving from lying on your back to sitting on the side of a flat bed without using bedrails?: A Little Help needed moving to and from a bed to a chair (including a wheelchair)?: A Lot Help needed standing up from a chair using your arms (e.g., wheelchair or bedside chair)?: A Lot Help needed to walk in hospital room?: A Lot Help needed climbing 3-5 steps with a railing? : Total 6 Click Score: 13    End of Session Equipment Utilized During Treatment: Gait belt Activity Tolerance: Patient tolerated treatment well Patient left: in chair;with call bell/phone within reach;with chair alarm set;with family/visitor present Nurse Communication: Mobility status PT Visit Diagnosis: Unsteadiness on feet (R26.81);Difficulty in walking, not elsewhere classified (R26.2)    Time: 6578-4696 PT Time Calculation (min) (ACUTE ONLY): 28 min   Charges:   PT Evaluation $PT  Eval Moderate Complexity: 1 Mod PT Treatments $Therapeutic Activity: 8-22 mins PT General Charges $$ ACUTE PT VISIT: 1 Visit         Lewis Shock, PT, DPT Acute Rehabilitation Services Secure chat preferred Office #: [phone]   Iona Hansen 12/05/2023, 1:51 PM

## 2023-12-05 NOTE — TOC Progression Note (Signed)
 Transition of Care [facility]) - Progression Note    Patient Details  Name: Amber Cruz MRN: [id number] Date of Birth: 1956-03-11  [facility]) CM/SW Contact  Nicanor Bake Phone Number: [phone] 12/05/2023, 3:13 PM  Clinical Narrative:   HF CSW received a message via secure chat from FNP that the patient needed to be faxed out to Psych facilities due to there being no geuro beds available. Offers pending. Will update if there are any changes.   TOC will continue following.          Expected Discharge Plan and Services                                               Social Determinants of Health (SDOH) Interventions SDOH Screenings   Food Insecurity: No Food Insecurity (12/04/2023)  Housing: Patient Unable To Answer (12/04/2023)  Utilities: Low Risk  (10/13/2022)   [facility] prior to 11/26/2022., [facility]  Financial Resource Strain: Low Risk  (04/21/2022)   [facility] visits prior to 11/26/2022., [facility]  Physical Activity: Insufficiently Active (04/21/2022)   [facility] prior to 11/26/2022., [facility]  Social Connections: Socially Integrated (04/21/2022)   [facility] visits prior to 11/26/2022., [facility]  Stress: No Stress Concern Present (04/21/2022)   [facility] visits prior to 11/26/2022., [facility]  Tobacco Use: Low Risk  (12/01/2023)    Readmission Risk Interventions     No data to display

## 2023-12-05 NOTE — Plan of Care (Signed)

## 2023-12-05 NOTE — Consult Note (Signed)
 Client was asleep in her recliner on assessment.  Her family was at her bedside and stated the MD wanted to speak with psych.  This provider spoke with him and he stated she was medically cleared with suicidal ideations and decrease in ADL functioning since admission.  She needed assistance to transfer and eat today per the family.  Once PT is able to get her more independent, transfer to geropsychiatry can be facilitated.  [facility] is aware of her need for a bed once she is more physically stable for [facility] geropsych.  Family was notified of the next steps at her bedside, agreeable to the plan.  Nanine Means, PMHNP

## 2023-12-05 NOTE — Progress Notes (Signed)
 Inpatient Rehab Admissions Coordinator:   Per therapy recommendation,  patient was screened for CIR candidacy by [name] Salon, MS, CCC-SLP  At this time, Pt. is not appropriate for CIR. Current recommendations are for inpatient psych/behavioral health admission. CIR is not equipped to provide this level of supervision and cannot accept patients if they are being recommended for inpatient psych admission. If she requires both inpatient psychiatric care and physical rehabilitation, TOC will need to attempt to place her at a facility that can provide both. Please contact me with any questions.   [name] Salon, MS, CCC-SLP Rehab Admissions Coordinator  [phone] (celll) [phone] (office)

## 2023-12-05 NOTE — Progress Notes (Signed)
   NAME:  Amber Cruz, MRN:  [id number], DOB:  01-May-1956, LOS: 4 ADMISSION DATE:  12/01/2023, CONSULTATION DATE:  12/01/23 REFERRING MD:  Eloise Harman , CHIEF COMPLAINT:  AMS   History of Present Illness:  68 yo F PMH MDD, Anxiety, memory issues (family is describing what sounds like possible dementia) VWB disease, chronic pain / hemicrani continua who was BIB EMS 3/7 with decr LOC. Last seen this morning around 7. Family members had tried calling/texting from about 0830 onward and she did not answer/reply, which was unusual, prompting police welfare check. She was found w GCS 3, HR 40s, SBP 70s, and received narcan and glucagon without great improvement. Reportedly some pink pills on the ground at home and it is unclear what these are, and was also found with bottle of hydrocodone, labetalol, and bottle labeled lorazepam (though contents may not be lorazepam) as well as bottle ace Ace (Rx to dog, later it was determined that she did not take any of these). Per husband it sounds like she may be swapping meds with a friend, has been taking Zoloft the last few nights which sounds like her friend's Rx   In ED pt was intubated and started on propofol, received succ. She received atropine followed by epi gtt.    Poison control was engaged and rec labs, VS optimization, serial EKG   PCCM is called for admission in this setting   Pertinent  Medical History  MDD Anxiety VWB dz  Significant Hospital Events: Including procedures, antibiotic start and stop dates in addition to other pertinent events   3/7 admit to ICU after OD, intubated, HIET, epi gtt 3/8 extubated, off high dose insulin overnight, weaning epi, diuresed 3/9 CBGs remain in 70's despite D10 at 300's/ hr. Lasix overnight and HHFNC for WOB, remains net +15L 3/10 Remains on Epi drip and HHFNC at 20L   Interim History / Subjective:  No events, BP a little low but fine. Still issues with sundowning  Objective   Blood pressure (!) 103/50,  pulse 78, temperature 98.9 F (37.2 C), temperature source Oral, resp. rate (!) 26, height 5\' 6"  (1.676 m), weight 106.9 kg, SpO2 98%.        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

## 2023-12-06 DIAGNOSIS — F332 Major depressive disorder, recurrent severe without psychotic features: Secondary | ICD-10-CM

## 2023-12-06 DIAGNOSIS — T50902A Poisoning by unspecified drugs, medicaments and biological substances, intentional self-harm, initial encounter: Secondary | ICD-10-CM | POA: Diagnosis not present

## 2023-12-06 LAB — RENAL FUNCTION PANEL
Albumin: 2.6 g/dL — ABNORMAL LOW (ref 3.5–5.0)
Anion gap: 10 (ref 5–15)
BUN: 13 mg/dL (ref 8–23)
CO2: 26 mmol/L (ref 22–32)
Calcium: 8.8 mg/dL — ABNORMAL LOW (ref 8.9–10.3)
Chloride: 100 mmol/L (ref 98–111)
Creatinine, Ser: 0.66 mg/dL (ref 0.44–1.00)
GFR, Estimated: >60 mL/min (ref >60)
Glucose, Bld: 129 mg/dL — ABNORMAL HIGH (ref 70–99)
Phosphorus: 3.6 mg/dL (ref 2.5–4.6)
Potassium: 3.7 mmol/L (ref 3.5–5.1)
Sodium: 136 mmol/L (ref 135–145)

## 2023-12-06 LAB — GLUCOSE, CAPILLARY
Glucose-Capillary: 118 mg/dL — ABNORMAL HIGH (ref 70–99)
Glucose-Capillary: 162 mg/dL — ABNORMAL HIGH (ref 70–99)
Glucose-Capillary: 148 mg/dL — ABNORMAL HIGH (ref 70–99)

## 2023-12-06 MED ORDER — ACETAMINOPHEN 325 MG PO TABS: 650.0000 mg | ORAL_TABLET | Freq: Four times a day (QID) | ORAL | Status: DC | PRN

## 2023-12-06 MED ORDER — PNEUMOCOCCAL 20-VAL CONJ VACC 0.5 ML IM SUSY
0.5000 mL | PREFILLED_SYRINGE | INTRAMUSCULAR | Status: AC
  Administered 2023-12-07: 0.5 mL via INTRAMUSCULAR
  Filled 2023-12-06: qty 0.5

## 2023-12-06 MED ORDER — OLANZAPINE 10 MG IM SOLR
2.5000 mg | Freq: Two times a day (BID) | INTRAMUSCULAR | Status: DC | PRN
  Administered 2023-12-06: 2.5 mg via INTRAMUSCULAR
  Filled 2023-12-06: qty 10

## 2023-12-06 MED ORDER — LORAZEPAM 2 MG/ML IJ SOLN: 0.5000 mg | Freq: Four times a day (QID) | INTRAMUSCULAR | Status: DC | PRN

## 2023-12-06 MED ORDER — DOCUSATE SODIUM 100 MG PO CAPS
100.0000 mg | ORAL_CAPSULE | Freq: Two times a day (BID) | ORAL | Status: DC | PRN
  Administered 2023-12-07: 100 mg via ORAL
  Filled 2023-12-06: qty 1

## 2023-12-06 MED ORDER — CLONAZEPAM 0.5 MG PO TABS
1.0000 mg | ORAL_TABLET | Freq: Two times a day (BID) | ORAL | Status: DC
  Administered 2023-12-06 – 2023-12-08 (×4): 1 mg via ORAL
  Filled 2023-12-06 (×4): qty 2

## 2023-12-06 MED ORDER — POLYETHYLENE GLYCOL 3350 17 G PO PACK
17.0000 g | PACK | Freq: Every day | ORAL | Status: DC | PRN
  Administered 2023-12-07: 17 g via ORAL
  Filled 2023-12-06: qty 1

## 2023-12-06 MED ORDER — SERTRALINE HCL 50 MG PO TABS
25.0000 mg | ORAL_TABLET | Freq: Every day | ORAL | Status: DC
  Administered 2023-12-06 – 2023-12-08 (×3): 25 mg via ORAL
  Filled 2023-12-06 (×3): qty 1

## 2023-12-06 NOTE — Progress Notes (Signed)
 Physical Therapy Treatment Patient Details Name: Amber Cruz MRN: [id number] DOB: July 30, 1956 Today's Date: 12/06/2023   History of Present Illness 68 yo F who was BIB EMS 3/7 with decr LOC due to polysubstance overdose in suicide attempt. PMH: major depressive disorder, anxiety, memory issues (family is describing what sounds like possible dementia) VWB disease, chronic pain, hemicrania continua    PT Comments  Pt awake and alert on arrival, up in chair and agreeable to session with continued progress towards cute goals. Pt progressing gait this session with x3 bouts with RW for support, HHA and no AD with pt requiring up to min A to maintain balance with episodes of scissoring feet with RW and HHA and L drift with RW. Pt with poor ability to dual task, with noted increase in instability when talking while walking and with head turns. Pt needing increased cues for safety and navigation as pt unable to locate room and with poor recall for simple two step commands. Pt continues to benefit from skilled PT services to progress toward functional mobility goals.     If plan is discharge home, recommend the following: A lot of help with walking and/or transfers;A lot of help with bathing/dressing/bathroom;Assist for transportation;Help with stairs or ramp for entrance;Supervision due to cognitive status   Can travel by private vehicle        Equipment Recommendations       Recommendations for Other Services       Precautions / Restrictions Precautions Precautions: Fall Restrictions Weight Bearing Restrictions Per Provider Order: No     Mobility  Bed Mobility Overal bed mobility: Needs Assistance             General bed mobility comments: up in chair on arrival    Transfers Overall transfer level: Needs assistance Equipment used: Rolling walker (2 wheels), None Transfers: Sit to/from Stand Sit to Stand: Contact guard assist, Min assist           General transfer comment:  min A fading to CGA with cues for hand placement    Ambulation/Gait Ambulation/Gait assistance: Min assist, Contact guard assist Gait Distance (Feet): 150 Feet (+ 75' +75') Assistive device: Rolling walker (2 wheels), 1 person hand held assist, None Gait Pattern/deviations: Step-through pattern, Scissoring, Drifts right/left, Narrow base of support Gait velocity: decr     General Gait Details: Min A with rolling walker progressing to HHA and then no AD last 75'. pt needing increased cues for RW proximity and control, bumping obstacles on L and backing to make all turns, increased stability with HHA, x1 LOB with scissoring feet, without AD no LOB with min A to steady, unable to dual task   Stairs             Wheelchair Mobility     Tilt Bed    Modified Rankin (Stroke Patients Only)       Balance Overall balance assessment: Needs assistance Sitting-balance support: Feet supported, Bilateral upper extremity supported Sitting balance-Leahy Scale: Fair Sitting balance - Comments: no LOB this session   Standing balance support: During functional activity, Reliant on assistive device for balance, Bilateral upper extremity supported Standing balance-Leahy Scale: Poor Standing balance comment: dependent on external support                            Communication Communication Communication: No apparent difficulties  Cognition Arousal: Alert Behavior During Therapy: WFL for tasks assessed/performed   PT - Cognitive impairments:  History of cognitive impairments                       PT - Cognition Comments: STM deficits noted, per family history of demtia like sypmtoms Following commands: Impaired (requiring multimodal cues and increased time) Following commands impaired: Follows one step commands inconsistently, Follows one step commands with increased time    Cueing Cueing Techniques: Verbal cues, Tactile cues  Exercises      General Comments  General comments (skin integrity, edema, etc.): VSS, daughter present for session      Pertinent Vitals/Pain Pain Assessment Pain Assessment: Faces Faces Pain Scale: Hurts little more Pain Location: R knee Pain Descriptors / Indicators: Sore Pain Intervention(s): Monitored during session, Limited activity within patient's tolerance    Home Living   Living Arrangements: Spouse/significant other                      Prior Function            PT Goals (current goals can now be found in the care plan section) Acute Rehab PT Goals PT Goal Formulation: With family Time For Goal Achievement: 12/19/23 Progress towards PT goals: Progressing toward goals    Frequency    Min 3X/week      PT Plan      Co-evaluation              AM-PAC PT "6 Clicks" Mobility   Outcome Measure  Help needed turning from your back to your side while in a flat bed without using bedrails?: A Little Help needed moving from lying on your back to sitting on the side of a flat bed without using bedrails?: A Little Help needed moving to and from a bed to a chair (including a wheelchair)?: A Lot Help needed standing up from a chair using your arms (e.g., wheelchair or bedside chair)?: A Little Help needed to walk in hospital room?: A Lot Help needed climbing 3-5 steps with a railing? : Total 6 Click Score: 14    End of Session Equipment Utilized During Treatment: Gait belt Activity Tolerance: Patient tolerated treatment well Patient left: in chair;with call bell/phone within reach;with family/visitor present;with nursing/sitter in room Nurse Communication: Mobility status PT Visit Diagnosis: Unsteadiness on feet (R26.81);Difficulty in walking, not elsewhere classified (R26.2)     Time: 4098-1191 PT Time Calculation (min) (ACUTE ONLY): 22 min  Charges:    $Gait Training: 23-37 mins PT General Charges $$ ACUTE PT VISIT: 1 Visit                     Cledith Kamiya R. PTA Acute  Rehabilitation Services Office: [phone]   Catalina Antigua 12/06/2023, 4:00 PM

## 2023-12-06 NOTE — Hospital Course (Addendum)
 68YOf  W/ mDD, Anxiety, memory issues (family is describing what sounds like possible dementia) VWB disease, chronic pain / hemicrani continua who was BIB EMS 12/01/23 and decreased mental status around 7 in am.Family members had tried calling/texting from about 0830 onward and she did not answer/reply, which was unusual, prompting police welfare check. She was found w GCS 3, HR 40s, SBP 70s, and received narcan and glucagon without great improvement. Reportedly some pink pills on the ground at home Patient was intubated in the ED and admitted to ICU and subsequently extubated 3/8, managed with high-dose insulin, Levophed and treated for intentional beta-blocker overdose and also on chronic benzodiazepines/abuse at home and some evidence of volume overload.  Psychiatry was consulted due to patient's suicidal intent/action overdose.  Patient working with PT OT, stabilized and transferred to [facility] service//Dr.  Significant imaging/procedures: CT head 3/7 no acute finding 3/7 intubated-3/8 extubated  Consultation: Psychiatry Critical care

## 2023-12-06 NOTE — Consult Note (Signed)
 [facility] Psychiatric Consult Follow up  Patient Name: .Amber Cruz  MRN: [id number]  DOB: [birth year]  Consult Order details:  Orders (From admission, onward)     Start     Ordered   12/02/23 1851  IP CONSULT TO PSYCHIATRY       Ordering Provider: Lorin Glass, MD  Provider:  (Not yet assigned)  Question Answer Comment  Location [facility]   Reason for Consult? suicide attempt      12/02/23 1850             Mode of Visit: In person    Psychiatry Consult Evaluation  Service Date: December 06, 2023 LOS:  LOS: 5 days  Chief Complaint "polysubstance overdose in a suicide attempt"  Primary Psychiatric Diagnoses  Major depressive disorder, recurrent, severe 2.  Generalized Anxiety Disorder   Assessment  Amber Cruz is a 68 y.o. female admitted: Medicallyfor 12/01/2023  2:45 PM with loss of consciousness after overdose. 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 Psychiatry consulted for suicide attempt.  Her current presentation of recent multiple substance overdose and ongoing depressive symptoms is most consistent with major depressive disorder, recurrent, severe. She meets criteria for inpatient admission based on recent overdose, being danger to herself and poorly treated depression due to medication non complaint. Current outpatient psychotropic medications include Zoloft 50 mg daily and propranolol 10 mg PRN for anxiety but unsure if this medications are working.She was not compliant with medications prior to admission as evidenced by reports from husband and daughter. On initial examination, patient is awake but altered, she is disoriented to time, place, person and situation. Please see plan below for detailed recommendations.   12/06/2023: The client was transferred from ICU to the medical floor and was sitting in her recliner  feeding herself without issues.  Her daughter was at her bedside.  She stated, "I feel alright, just tired".  She reported her depression was moderate with no suicidal ideations.  Vague memories of the event leading to admission.  "My husband and I were arguing.  He was not the nicest to me."  She did not recall overdosing.  When asked if she has spoken to him since admission, she denied.  However, her daughter reported he is visiting twice daily.  Denies psychosis and substance abuse.  Psychiatric admission recommended once she is more physically able to walk and transfer independently.  According to her daughter, she "completely independent" prior to admission.  The plan communicated for gero psych, client agreeable.  Diagnoses:  Active Hospital problems: Principal Problem:   Overdose Active Problems:   DNR (do not resuscitate)   Unresponsive    Plan   ## Psychiatric Medication Recommendations:  Zoloft 50 mg daily restarted at 25 mg daily Klonopin that was ordered at 1 mg in the am and 2 mg in the pm, decreased to BID with the goal to continue to taper TOC for gero psych transfer in place  ## Medical Decision Making Capacity: Not specifically addressed in this encounter  ## Further Work-up:  TSH, B12, folate -- most recent EKG on 12/03/2023 had QtC of 400 -- Pertinent labwork reviewed earlier this admission includes: Na-121, K-3.4, Ca-7.8   ## Disposition:-- We recommend inpatient psychiatric hospitalization when medically cleared. Patient is under voluntary admission status at this time; please IVC if attempts to leave hospital.  ## Behavioral / Environmental: -  Delirium Precautions: Delirium Interventions for Nursing and Staff: - RN to open blinds every AM. - To Bedside: Glasses, hearing aide, and pt's own shoes. Make available to patients. when possible and encourage use. - Encourage po fluids when appropriate, keep fluids within reach. - OOB to chair with meals. - Passive ROM exercises  to all extremities with AM & PM care. - RN to assess orientation to person, time and place QAM and PRN. - Recommend extended visitation hours with familiar family/friends as feasible. - Staff to minimize disturbances at night. Turn off television when pt asleep or when not in use.    ## Safety and Observation Level:  - Based on my clinical evaluation, I estimate the patient to be at high risk of self harm in the current setting. - At this time, we recommend  1:1 Observation. This decision is based on my review of the chart including patient's history and current presentation, interview of the patient, mental status examination, and consideration of suicide risk including evaluating suicidal ideation, plan, intent, suicidal or self-harm behaviors, risk factors, and protective factors. This judgment is based on our ability to directly address suicide risk, implement suicide prevention strategies, and develop a safety plan while the patient is in the clinical setting. Please contact our team if there is a concern that risk level has changed.  CSSR Risk Category:   Suicide Risk Assessment: Patient has following modifiable risk factors for suicide: active suicidal ideation, under treated depression , and medication noncompliance, which we are addressing by monitoring with the plan to transfer to psychiatric inpatient. Patient has following non-modifiable or demographic risk factors for suicide: history of suicide attempt, history of self harm behavior, and psychiatric hospitalization Patient has the following protective factors against suicide: Supportive family  Thank you for this consult request. Recommendations have been communicated to the primary team.  We will follow up at this time.   Nanine Means, NP       History of Present Illness  Relevant Aspects of [facility] Course:  Admitted on 12/01/2023 for loss of consciousness.   Patient Report:  Patient seen face to face in her hospital  room. She is awake but altered, and disoriented to time, place, person and situation. Patient could not participate in evaluation due to altered mental status, as a result, majority of the history was obtained from the daughter and husband at the bedside. Husband reports patient has been suffering from recurrent major depression and severe anxiety for a long time which got worse due to their recent relocating. Husband explained further that patient was found unconscious with an empty bottle of an unknown amount of her prescribed medications and husband's medications including Lorazepam, Propranolol, and hydrocodone. Urine toxicology is positive for Opiate and Benzodiazepine. He states that patient currently sees a psychiatry NP via tele health at [facility] who prescribes Zoloft 50 mg daily and Propranolol 10 mg PRN, but she has not been compliant with her medications. She had tried multiple medications in the past but would not take it; always complaining that medications are not working without giving it time to kick in before stopping it.   Daughter reports previous suicide attempts via overdose and psychiatric hospitalizations in the past but a long time ago. She believes her mother is severely depressed and suffering from severe anxiety. Daughter and husband also report ongoing memory issues for the past two years, and patient was evaluated by a Neurologist few months ago, but the report  was negative for Dementia. Daughter denies delusions, hallucinations, and other perceptual disturbances.She also denies use of illicit substances but she drinks alcohol occasionally.   Collateral information:  Information obtained from patient's daughter and husband.   Review of Systems  Psychiatric/Behavioral:  Positive for depression and suicidal ideas.      Psychiatric and Social History  Psychiatric History:  Information collected from husband and daughter   Prev Dx/Sx:  MDD/GAD Current Psych Provider: NP at [facility] Meds (current): Zoloft 50 mg daily and Propranolol 10 mg PRN Previous Med Trials: Most SSRI/SNRI-unsure.  Therapy: denies   Prior Psych Hospitalization: yes, but more than 35 years ago due to suicide attempt.   Prior Self Harm: yes Prior Violence: denies   Family Psych History: denies  Family Hx suicide: denies   Social History:  Educational Hx: high school graduate and some certification Occupational Hx: In the process of retiring.  Legal Hx: denies  Living Situation: Lives with husband of 16 years.  Spiritual Hx: unknwn  Access to weapons/lethal means: denies    Substance History Alcohol: socially  Type of alcohol wine mostly Last Drink weeks ago Number of drinks per day unknown  History of alcohol withdrawal seizures denies  History of DT's denies  Tobacco: denies  Illicit drugs: denies  Prescription drug abuse: denies  Rehab hx: denies   Exam Findings  Physical Exam:  Vital Signs:  Temp:  [97.6 F (36.4 C)-98.9 F (37.2 C)] 98.4 F (36.9 C) (03/12 0829) Pulse Rate:  [60-83] 72 (03/12 0829) Resp:  [9-40] 19 (03/12 0829) BP: (78-115)/(37-87) 102/59 (03/12 0829) SpO2:  [90 %-100 %] 90 % (03/12 0829) Weight:  [106.9 kg] 106.9 kg (03/12 0434) Blood pressure (!) 102/59, pulse 72, temperature 98.4 F (36.9 C), temperature source Oral, resp. rate 19, height 5\' 6"  (1.676 m), weight 106.9 kg, SpO2 90%. Body mass index is 38.04 kg/m.  Physical Exam  Mental Status Exam: General Appearance: Casual  Orientation:  Alert and oriented times 3  Memory:  FAir  Concentration:  Fair  Recall:  FAir  Attention  FAir  Eye Contact:  Good  Speech:  WDL  Language:  WDL  Volume:  WDL  Mood: "depressed"  Affect:  Blunt  Thought Process:  logical  Thought Content:   WDL  Suicidal Thoughts:  denied  Homicidal Thoughts:  denied  Judgement:  Poor  Insight:  fair to poor  Psychomotor  Activity:  Decreased  Akathisia:  No  Fund of Knowledge:  fair      Assets:  Housing Social Support  Cognition:  WDl  ADL's:  Intact  AIMS (if indicated):        Other History   These have been pulled in through the EMR, reviewed, and updated if appropriate.  Family History:  The patient's family history includes Alzheimer's disease in her mother; Heart failure in her father; Hypertension in her father.  Medical History: Past Medical History:  Diagnosis Date   Aneurysm (HCC)    Anxiety    Frequent infections of left ear    Thyroid disease     Surgical History: Past Surgical History:  Procedure Laterality Date   DESCENDING AORTIC ANEURYSM REPAIR W/ STENT     MYRINGOTOMY Left 2018     Medications:   Current Facility-Administered Medications:    acetaminophen (TYLENOL) suppository 650 mg, 650 mg, Rectal, Q4H PRN, Paliwal, Aditya, MD, 650 mg at 12/04/23 0113   acetaminophen (TYLENOL) tablet 650 mg,  650 mg, Per Tube, Q6H PRN, Paliwal, Aditya, MD   Chlorhexidine Gluconate Cloth 2 % PADS 6 each, 6 each, Topical, Daily, Agarwala, Ravi, MD, 6 each at 12/06/23 1003   clonazePAM (KLONOPIN) tablet 1 mg, 1 mg, Oral, Daily, Lorin Glass, MD, 1 mg at 12/06/23 1002   clonazePAM (KLONOPIN) tablet 2 mg, 2 mg, Oral, QHS, Lorin Glass, MD, 2 mg at 12/05/23 1937   docusate (COLACE) 50 MG/5ML liquid 100 mg, 100 mg, Per Tube, BID PRN, Leander Rams, RPH   heparin injection 5,000 Units, 5,000 Units, Subcutaneous, Q8H, Cloyd Stagers M, PA-C, 5,000 Units at 12/06/23 0528   LORazepam (ATIVAN) injection 1 mg, 1 mg, Intravenous, Once, Paliwal, Aditya, MD   LORazepam (ATIVAN) injection 1-2 mg, 1-2 mg, Intravenous, Q4H PRN, Lorin Glass, MD, 2 mg at 12/05/23 2054   OLANZapine (ZYPREXA) injection 2.5 mg, 2.5 mg, Intravenous, BID PRN, Lorin Glass, MD, 2.5 mg at 12/05/23 2205   Oral care mouth rinse, 15 mL, Mouth Rinse, PRN, Lorin Glass, MD   polyethylene glycol (MIRALAX / GLYCOLAX)  packet 17 g, 17 g, Per Tube, Daily PRN, Cloyd Stagers M, PA-C   sodium chloride flush (NS) 0.9 % injection 10-40 mL, 10-40 mL, Intracatheter, Q12H, Agarwala, Ravi, MD, 10 mL at 12/06/23 1003   sodium chloride flush (NS) 0.9 % injection 10-40 mL, 10-40 mL, Intracatheter, PRN, Lynnell Catalan, MD  Allergies: Allergies  Allergen Reactions   Iodinated Contrast Media Itching and Rash    CT scan done w/ IV dye on 06/08/12. Per pt / [facility] pt needs to be pre-medicated if needs future CT scans w/IV contrast.  CT scan done w/ IV dye on 06/08/12. Per pt / [facility] pt needs to be pre-medicated if needs future CT scans w/IV contrast.     Ambien [Zolpidem] Other (See Comments)    Drove vehicle without knowledge of driving. From [place], [place]   Prochlorperazine Other (See Comments)   Honey Bee Venom Protein [place] April Bee Venom]    Iodides     Nanine Means, NP

## 2023-12-06 NOTE — Plan of Care (Signed)
  Problem: Education: Goal: Ability to describe self-care measures that may prevent or decrease complications (Diabetes Survival Skills Education) will improve Outcome: Progressing Goal: Individualized Educational Video(s) Outcome: Progressing   Problem: Coping: Goal: Ability to adjust to condition or change in health will improve Outcome: Progressing   Problem: Health Behavior/Discharge Planning: Goal: Ability to identify and utilize available resources and services will improve Outcome: Progressing Goal: Ability to manage health-related needs will improve Outcome: Progressing   Problem: Metabolic: Goal: Ability to maintain appropriate glucose levels will improve Outcome: Progressing   Problem: Nutritional: Goal: Maintenance of adequate nutrition will improve Outcome: Progressing Goal: Progress toward achieving an optimal weight will improve Outcome: Progressing   Problem: Skin Integrity: Goal: Risk for impaired skin integrity will decrease Outcome: Progressing   Problem: Tissue Perfusion: Goal: Adequacy of tissue perfusion will improve Outcome: Progressing   Problem: Education: Goal: Knowledge of General Education information will improve Description: Including pain rating scale, medication(s)/side effects and non-pharmacologic comfort measures Outcome: Progressing   Problem: Coping: Goal: Level of anxiety will decrease Outcome: Progressing   Problem: Elimination: Goal: Will not experience complications related to bowel motility Outcome: Progressing Goal: Will not experience complications related to urinary retention Outcome: Progressing

## 2023-12-06 NOTE — Progress Notes (Signed)
 PROGRESS NOTE Amber Cruz  ZOX:[id number] DOB: [birth year] DOA: 12/01/2023 PCP: Lenox Ponds, MD  Brief Narrative/Hospital Course: [phone]  W/ mDD, Anxiety, memory issues (family is describing what sounds like possible dementia) VWB disease, chronic pain / hemicrani continua who was BIB EMS 12/01/23 and decreased mental status around 7 in am.Family members had tried calling/texting from about 0830 onward and she did not answer/reply, which was unusual, prompting police welfare check. She was found w GCS 3, HR 40s, SBP 70s, and received narcan and glucagon without great improvement. Reportedly some pink pills on the ground at home Patient was intubated in the ED and admitted to ICU and subsequently extubated 3/8, managed with high-dose insulin, Levophed and treated for intentional beta-blocker overdose and also on chronic benzodiazepines/abuse at home and some evidence of volume overload.  Psychiatry was consulted due to patient's suicidal intent/action overdose.  Patient working with PT OT, stabilized and transferred to [facility] service//Dr.  Significant imaging/procedures: CT head 3/7 no acute finding 3/7 intubated-3/8 extubated  Consultation: Psychiatry Critical care    Subjective: Seen this morning.  Daughter at the bedside.  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  Vitals at this time stabilized.  Psychiatry has seen the patient-plan for [facility] psychiatric admission once bed available.  Hopefully she will be more mobile with PT OT.  Continue  current Klonopin 0.25 daily, 0.25 bedtime, as needed Zyprexa/Ativan per psych  Acute respiratory failure/volume overload: Initially intermittently ED subsequently extubated and doing well on [place], wean as able mobilize.  Hypothyroidism: not taking home meds, holding off to resume at this time since TSH normal.  Monitor for with PCP  Noncompaction medication: Continue per psych  Memory impaired remains/MCI: Patient has been forgetful for the last year and have, since recently retiring mental status somewhat worsening.?  Undiagnosed dementia  Class II Obesity: Patient's Body mass index is 38.04 kg/m. : Will benefit with PCP follow-up, weight loss  healthy lifestyle and outpatient sleep evaluation.  Foley catheter in place: Once she is more mobile will discontinue Foley catheter next 1 to 2 days  DVT prophylaxis: heparin injection 5,000 Units Start: 12/01/23 1715 SCDs Start: 12/01/23 1701 Code Status:   Code Status: Do not attempt resuscitation (DNR) PRE-ARREST INTERVENTIONS DESIRED Family Communication: plan of care discussed with patient/daughter at bedside. Patient status is: Remains hospitalized because of severity of illness Level of care: Progressive   Dispo: The patient is from: home alone            Anticipated disposition: Geriatric psychiatry facility Objective: Vitals last 24 hrs: Vitals:   12/05/23 2300 12/05/23 2334 12/06/23 0434 12/06/23 0829  BP: (!) 98/53 104/60 (!) 102/54 (!) 102/59  Pulse: 81 80  72  Resp: (!) 23 20 18 19   Temp:  98.4 F (36.9 C) 97.6 F (36.4 C) 98.4 F (36.9 C)  TempSrc:  Oral Axillary Oral  SpO2: 92% 94% 90% 90%  Weight:   106.9 kg   Height:       Weight change: 2.3 kg  Physical Examination: General exam: alert awake oriented to all place year not to month/day or events,at  baseline, older than stated age HEENT:Oral mucosa moist, Ear/Nose WNL grossly Respiratory system: Bilaterally clear BS,no use of accessory muscle Cardiovascular  system: S1 & S2 +, No JVD. Gastrointestinal system: Abdomen soft,NT,ND, BS+ Nervous System: Alert, awake, moving all extremities,and following commands. Extremities: LE edema neg,distal peripheral pulses palpable and warm.  Skin: No rashes,no icterus. MSK: Normal muscle bulk,tone, power   Medications reviewed:  Scheduled Meds:  Chlorhexidine Gluconate Cloth  6 each Topical Daily   clonazePAM  1 mg Oral Daily   clonazePAM  2 mg Oral QHS   heparin  5,000 Units Subcutaneous Q8H   LORazepam  1 mg Intravenous Once   sodium chloride flush  10-40 mL Intracatheter Q12H   Continuous Infusions:    Diet Order             Diet Carb Modified Fluid consistency: Thin; Room service appropriate? Yes  Diet effective now                   Intake/Output Summary (Last 24 hours) at 12/06/2023 1113 Last data filed at 12/06/2023 1034 Gross per 24 hour  Intake 410 ml  Output 1535 ml  Net -1125 ml   Net IO Since Admission: 159.25 mL [12/06/23 1113]  Wt Readings from Last 3 Encounters:  12/06/23 106.9 kg  11/17/23 95.3 kg  10/20/23 99.8 kg     Unresulted Labs (From admission, onward)    None     Data Reviewed: I have personally reviewed following labs and imaging studies CBC: Recent Labs  Lab 12/01/23 1513 12/01/23 1532 12/02/23 0538 12/02/23 2227 12/04/23 0354  WBC 8.3  --  14.3*  --  10.7*  NEUTROABS 6.5  --   --   --   --   HGB 12.3 13.6 12.2 11.6* 13.2  HCT 38.5 40.0 37.9 34.0* 38.9  MCV 92.5  --  91.3  --  87.0  PLT 178  --  193  --  183   Basic Metabolic Panel:  Recent Labs  Lab 12/01/23 1831 12/01/23 2233 12/02/23 0538 12/02/23 0829 12/03/23 1757 12/04/23 0020 12/04/23 0306 12/05/23 0633 12/06/23 0243  NA  --    < > 136   < > 128* 131* 131* 141 136  K  --    < > 3.1*   < > 4.0 4.0 4.4 3.7 3.7  CL  --    < > 111   < > 100 101 99 103 100  CO2  --    < > 23   < > 21* 22 20* 35* 26  GLUCOSE  --    < > 166*   < > 186* 137* 180* 123* 129*  BUN  --    < > 6*   < >  <5* <5* <5* 10 13  CREATININE  --    < > 0.86   < > 0.79 0.81 0.78 0.79 0.66  CALCIUM  --    < > 8.3*   < > 8.6* 8.2* 8.4* 8.9 8.8*  MG 2.2  --  1.5*  --  1.9  --  2.2  --   --   PHOS 2.9  --  <1.0*  --  2.0*  --  3.0 3.3 3.6   < > = values in this interval not displayed.   GFR: Estimated Creatinine Clearance: 82 mL/min (by C-G formula based on SCr of 0.66 mg/dL). Liver Function Tests:  Recent Labs  Lab 12/01/23 1513 12/01/23 2233 12/02/23 0538 12/03/23 1757  12/04/23 0306 12/05/23 0633 12/06/23 0243  AST 22 PATIENT IDENTIFICATION ERROR. PLEASE DISREGARD RESULTS. ACCOUNT WILL BE CREDITED. 32 41  --   --   --   ALT 16 PATIENT IDENTIFICATION ERROR. PLEASE DISREGARD RESULTS. ACCOUNT WILL BE CREDITED. 25 29  --   --   --   ALKPHOS 33* PATIENT IDENTIFICATION ERROR. PLEASE DISREGARD RESULTS. ACCOUNT WILL BE CREDITED. 30* 40  --   --   --   BILITOT 0.8 PATIENT IDENTIFICATION ERROR. PLEASE DISREGARD RESULTS. ACCOUNT WILL BE CREDITED. 0.7 0.8  --   --   --   PROT 5.3* PATIENT IDENTIFICATION ERROR. PLEASE DISREGARD RESULTS. ACCOUNT WILL BE CREDITED. 4.8* 5.4*  --   --   --   ALBUMIN 3.1* PATIENT IDENTIFICATION ERROR. PLEASE DISREGARD RESULTS. ACCOUNT WILL BE CREDITED. 2.8* 2.9* 2.8* 2.9* 2.6*   No results for input(s): "LIPASE", "AMYLASE" in the last 168 hours. No results for input(s): "AMMONIA" in the last 168 hours. Coagulation Profile:  Recent Labs  Lab 12/04/23 0354  INR 1.0   No results for input(s): "PROBNP" in the last 168 hours.  No results for input(s): "HGBA1C" in the last 72 hours. Recent Labs  Lab 12/05/23 1135 12/05/23 1620 12/05/23 2027 12/05/23 2344 12/06/23 0500  GLUCAP 152* 161* 127* 123* 118*   No results for input(s): "CHOL", "HDL", "LDLCALC", "TRIG", "CHOLHDL", "LDLDIRECT" in the last 72 hours. Recent Labs    12/04/23 0306  TSH 3.156   Sepsis Labs: Recent Labs  Lab 12/01/23 1700 12/05/23 1478  LATICACIDVEN 0.7 0.9   Recent Results (from the past 240  hours)  MRSA Next Gen by PCR, Nasal     Status: None   Collection Time: 12/01/23  6:04 PM   Specimen: Nasal Mucosa; Nasal Swab  Result Value Ref Range Status   MRSA by PCR Next Gen NOT DETECTED NOT DETECTED Final    Comment: (NOTE) The GeneXpert MRSA Assay (FDA approved for NASAL specimens only), is one component of a comprehensive MRSA colonization surveillance program. It is not intended to diagnose MRSA infection nor to guide or monitor treatment for MRSA infections. Test performance is not FDA approved in patients less than 40 years old. Performed at [facility], [address], [place], [place] [address]   Culture, blood (Routine X 2) w Reflex to ID Panel     Status: None (Preliminary result)   Collection Time: 12/04/23  3:52 AM   Specimen: BLOOD LEFT HAND  Result Value Ref Range Status   Specimen Description BLOOD LEFT HAND  Final   Special Requests   Final    BOTTLES DRAWN AEROBIC AND ANAEROBIC Blood Culture results may not be optimal due to an inadequate volume of blood received in culture bottles   Culture   Final    NO GROWTH 2 DAYS Performed at [facility], [address] [place], [place] [address]    Report Status PENDING  Incomplete  Culture, blood (Routine X 2) w Reflex to ID Panel     Status: None (Preliminary result)   Collection Time: 12/04/23  3:54 AM   Specimen: BLOOD RIGHT HAND  Result Value Ref Range Status   Specimen Description BLOOD RIGHT HAND  Final   Special Requests   Final    BOTTLES DRAWN AEROBIC AND ANAEROBIC Blood Culture results may not be optimal due to an inadequate volume of blood received in culture bottles   Culture   Final    NO GROWTH 2 DAYS  Performed at [facility], [address], [place], [place] [address]    Report Status PENDING  Incomplete    Antimicrobials/Microbiology: Anti-infectives (From admission, onward)    Start     Dose/Rate Route Frequency Ordered Stop   12/01/23 1900  Ampicillin-Sulbactam  (UNASYN) 3 g in sodium chloride 0.9 % 100 mL IVPB  Status:  Discontinued        3 g 200 mL/hr over 30 Minutes Intravenous Every 6 hours 12/01/23 1801 12/02/23 0834         Component Value Date/Time   SDES BLOOD RIGHT HAND 12/04/2023 0354   SPECREQUEST  12/04/2023 0354    BOTTLES DRAWN AEROBIC AND ANAEROBIC Blood Culture results may not be optimal due to an inadequate volume of blood received in culture bottles   CULT  12/04/2023 0354    NO GROWTH 2 DAYS Performed at [facility], [address] [place], [place] [address]    REPTSTATUS PENDING 12/04/2023 [phone]     Radiology Studies: No results found.   LOS: 5 days   Total time spent in review of labs and imaging, patient evaluation, formulation of plan, documentation and communication with family: 35 minutes  Lanae Boast, MD  [facility]  12/06/2023, 11:13 AM

## 2023-12-06 NOTE — Evaluation (Signed)
 Occupational Therapy Evaluation Patient Details Name: Amber Cruz MRN: [id number] DOB: [birth year] Today's Date: 12/06/2023   History of Present Illness   68 yo F who was BIB EMS 3/7 with decr LOC due to polysubstance overdose in suicide attempt. PMH: major depressive disorder, anxiety, memory issues (family is describing what sounds like possible dementia) VWB disease, chronic pain, hemicrania continua     Clinical Impressions Prior to this admission, patient living with her spouse, and independent with ADLs and functional mobility without AD though memory has been recently declining. Currently, patient lethargic, but would alert to voice, and improving once sitting EOB. Patient with noted STM and need for re-direction throughout session. Patient min A for bed mobility, ADLs, and functional ambulation with a RW (though noted decreased balance with turns and walking backwards). Plan is for inpatient psych, therefore OT will continue to see acutely in order to promote patient getting to at least a mod I level without AD so she can get the level of care she needs.     If plan is discharge home, recommend the following:   A little help with walking and/or transfers;A lot of help with bathing/dressing/bathroom;Assistance with cooking/housework;Assistance with feeding;Direct supervision/assist for medications management;Direct supervision/assist for financial management;Help with stairs or ramp for entrance;Supervision due to cognitive status;Assist for transportation     Functional Status Assessment   Patient has had a recent decline in their functional status and demonstrates the ability to make significant improvements in function in a reasonable and predictable amount of time.     Equipment Recommendations   None recommended by OT     Recommendations for Other Services         Precautions/Restrictions   Precautions Precautions: Fall Restrictions Weight Bearing  Restrictions Per Provider Order: No     Mobility Bed Mobility Overal bed mobility: Needs Assistance Bed Mobility: Supine to Sit     Supine to sit: Min assist     General bed mobility comments: Bed flat, min A for safety and sequencing, no physical assist    Transfers Overall transfer level: Needs assistance Equipment used: Rolling walker (2 wheels) Transfers: Sit to/from Stand Sit to Stand: Min assist           General transfer comment: Min A with rolling walker, requiring 2 attempts to come up to walker with cues for hand placement when bed was in lowest position, noted difficulty with turns and walking backwards      Balance Overall balance assessment: Needs assistance Sitting-balance support: Feet supported, Bilateral upper extremity supported Sitting balance-Leahy Scale: Fair Sitting balance - Comments: no LOB this session   Standing balance support: During functional activity, Reliant on assistive device for balance, Bilateral upper extremity supported Standing balance-Leahy Scale: Poor Standing balance comment: dependent on external support                           ADL either performed or assessed with clinical judgement   ADL Overall ADL's : Needs assistance/impaired Eating/Feeding: Set up;Sitting   Grooming: Set up;Sitting   Upper Body Bathing: Set up;Sitting   Lower Body Bathing: Moderate assistance;Sit to/from stand;Sitting/lateral leans   Upper Body Dressing : Set up;Sitting   Lower Body Dressing: Moderate assistance;Sit to/from stand;Sitting/lateral leans   Toilet Transfer: Minimal assistance;Ambulation;Rolling walker (2 wheels) Toilet Transfer Details (indicate cue type and reason): simulated with functional movement Toileting- Clothing Manipulation and Hygiene: Moderate assistance;Sitting/lateral lean;Sit to/from stand       Functional  mobility during ADLs: Minimal assistance;Cueing for safety;Cueing for sequencing;Rolling walker  (2 wheels) General ADL Comments: Prior to this admission, patient living with her spouse, and independent with ADLs and functional mobility without AD though memory has been recently declining. Currently, patient lethargic, but would alert to voice, and improving once sitting EOB. Patient with noted STM and need for re-direction throughout session. Patient min A for bed mobility, ADLs, and functional ambulation with a RW (though noted decreased balance with turns and walking backwards). Plan is for inpatient psych, therefore OT will continue to see acutely in order to promote patient getting to at least a mod I level without AD so she can get the level of care she needs.     Vision Baseline Vision/History: 1 Wears glasses (Contacts) Ability to See in Adequate Light: 0 Adequate Patient Visual Report: No change from baseline Vision Assessment?: No apparent visual deficits     Perception Perception: Not tested       Praxis Praxis: Not tested       Pertinent Vitals/Pain Pain Assessment Pain Assessment: Faces Faces Pain Scale: No hurt Pain Intervention(s): Limited activity within patient's tolerance, Monitored during session, Repositioned     Extremity/Trunk Assessment Upper Extremity Assessment Upper Extremity Assessment: Generalized weakness   Lower Extremity Assessment Lower Extremity Assessment: Defer to PT evaluation   Cervical / Trunk Assessment Cervical / Trunk Assessment: Normal   Communication Communication Communication: No apparent difficulties   Cognition Arousal: Lethargic, Suspect due to medications (sleepy, received clonopin) Behavior During Therapy: WFL for tasks assessed/performed Cognition: Difficult to assess             OT - Cognition Comments: Decreased cognition suspect due to medications, however noted increased STM deficits, daughter present for session and admits she is clearer than PT eval on 3/11 but not at baseline                  Following commands: Impaired (requiring multimodal cues and increased time) Following commands impaired: Follows one step commands inconsistently, Follows one step commands with increased time     Cueing  General Comments   Cueing Techniques: Verbal cues;Tactile cues  VSS, daughter present for session   Exercises     Shoulder Instructions      Home Living Family/patient expects to be discharged to:: Other (Comment)                                 Additional Comments: plan for patient to be admitted to behavioral health/inpatient psych unit      Prior Functioning/Environment Prior Level of Function : Needs assist;Working/employed             Mobility Comments: daughter reports decline in mobility over the last month since she was sick with the flu and PNA beginning of february, 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 ADLs Comments: indep    OT Problem List: Decreased activity tolerance;Decreased strength;Impaired balance (sitting and/or standing);Decreased coordination;Decreased cognition;Decreased safety awareness;Decreased knowledge of use of DME or AE;Obesity;Impaired UE functional use   OT Treatment/Interventions: Self-care/ADL training;Therapeutic exercise;Energy conservation;DME and/or AE instruction;Manual therapy;Therapeutic activities;Cognitive remediation/compensation;Patient/family education;Balance training      OT Goals(Current goals can be found in the care plan section)   Acute Rehab OT Goals Patient Stated Goal: to get better OT Goal Formulation: With patient/family Time For Goal Achievement: 12/20/23 Potential to Achieve Goals: Good ADL Goals Pt  Will Perform Grooming: with modified independence;standing Pt Will Perform Lower Body Bathing: with modified independence;sitting/lateral leans;sit to/from stand Pt Will Perform Lower Body Dressing: with modified independence;sit to/from stand;sitting/lateral  leans Pt Will Transfer to Toilet: with modified independence;ambulating;regular height toilet Pt Will Perform Toileting - Clothing Manipulation and hygiene: with modified independence;sitting/lateral leans;sit to/from stand Additional ADL Goal #1: Patient will be able to complete functional task in standing for 3 minutes prior to needing seated rest break in order to increase overall activity tolerance.   OT Frequency:  Min 4X/week    Co-evaluation              AM-PAC OT "6 Clicks" Daily Activity     Outcome Measure Help from another person eating meals?: A Little Help from another person taking care of personal grooming?: A Little Help from another person toileting, which includes using toliet, bedpan, or urinal?: A Lot Help from another person bathing (including washing, rinsing, drying)?: A Lot Help from another person to put on and taking off regular upper body clothing?: A Little Help from another person to put on and taking off regular lower body clothing?: A Lot 6 Click Score: 15   End of Session Equipment Utilized During Treatment: Gait belt;Rolling walker (2 wheels) Nurse Communication: Mobility status  Activity Tolerance: Patient tolerated treatment well Patient left: in chair;with call bell/phone within reach;with nursing/sitter in room;with family/visitor present  OT Visit Diagnosis: Unsteadiness on feet (R26.81);Other abnormalities of gait and mobility (R26.89);Muscle weakness (generalized) (M62.81);Other symptoms and signs involving cognitive function                Time: 6295-2841 OT Time Calculation (min): 31 min Charges:  OT General Charges $OT Visit: 1 Visit OT Evaluation $OT Eval Moderate Complexity: 1 Mod OT Treatments $Self Care/Home Management : 8-22 mins  Pollyann Glen E. Trigo Winterbottom, OTR/L Acute Rehabilitation Services [phone]   Cherlyn Cushing 12/06/2023, 1:29 PM

## 2023-12-07 DIAGNOSIS — T50904A Poisoning by unspecified drugs, medicaments and biological substances, undetermined, initial encounter: Secondary | ICD-10-CM | POA: Diagnosis not present

## 2023-12-07 DIAGNOSIS — E119 Type 2 diabetes mellitus without complications: Secondary | ICD-10-CM | POA: Diagnosis not present

## 2023-12-07 LAB — GLUCOSE, CAPILLARY
Glucose-Capillary: 114 mg/dL — ABNORMAL HIGH (ref 70–99)
Glucose-Capillary: 117 mg/dL — ABNORMAL HIGH (ref 70–99)
Glucose-Capillary: 116 mg/dL — ABNORMAL HIGH (ref 70–99)
Glucose-Capillary: 179 mg/dL — ABNORMAL HIGH (ref 70–99)
Glucose-Capillary: 131 mg/dL — ABNORMAL HIGH (ref 70–99)
Glucose-Capillary: 115 mg/dL — ABNORMAL HIGH (ref 70–99)
Glucose-Capillary: 109 mg/dL — ABNORMAL HIGH (ref 70–99)

## 2023-12-07 LAB — SARS CORONAVIRUS 2 BY RT PCR: SARS Coronavirus 2 by RT PCR: NEGATIVE

## 2023-12-07 MED ORDER — OLANZAPINE 10 MG IM SOLR: 2.5000 mg | Freq: Two times a day (BID) | INTRAMUSCULAR | Status: DC | PRN

## 2023-12-07 MED ORDER — CLONAZEPAM 1 MG PO TABS: 1.0000 mg | ORAL_TABLET | Freq: Two times a day (BID) | ORAL | Status: DC

## 2023-12-07 MED ORDER — LEVOTHYROXINE SODIUM 25 MCG PO TABS
137.0000 ug | ORAL_TABLET | Freq: Every day | ORAL | Status: DC
  Administered 2023-12-07 – 2023-12-08 (×2): 137 ug via ORAL
  Filled 2023-12-07 (×2): qty 1

## 2023-12-07 NOTE — Progress Notes (Signed)
 Physical Therapy Treatment Patient Details Name: Amber Cruz MRN: [id number] DOB: 1956/03/10 Today's Date: 12/07/2023   History of Present Illness 68 yo F who was BIB EMS 3/7 with decr LOC due to polysubstance overdose in suicide attempt. PMH: major depressive disorder, anxiety, memory issues (family is describing what sounds like possible dementia) VWB disease, chronic pain, hemicrania continua    PT Comments  Pt up in chair on arrival and agreeable to session with continued progress towards acute mobility goals. Pt able to progress gait distance without AD support, however needing up to mod A to correct LOB and prevent fall as pt easily distractible during mobility greatly affecting balance and safety with mobility. Pt able to perform static and dynamic balance activities, in minimally distracting environment of room, with light min A to steady with pt able to self correct all LOB without AD support. Pt continues to demonstrate STM deficits, unable to locate room or recall room number after stating it to this PTA <30 secs prior. Pt continues to need max cues for safety and navigation as well as re-direction to task at hand throughout session. Pt continues to benefit from skilled PT services to progress toward functional mobility goals.      If plan is discharge home, recommend the following: A lot of help with walking and/or transfers;A lot of help with bathing/dressing/bathroom;Assist for transportation;Help with stairs or ramp for entrance;Supervision due to cognitive status   Can travel by private vehicle        Equipment Recommendations   (TBD)    Recommendations for Other Services Rehab consult     Precautions / Restrictions Precautions Precautions: Fall Restrictions Weight Bearing Restrictions Per Provider Order: No     Mobility  Bed Mobility Overal bed mobility: Needs Assistance             General bed mobility comments: up in chair on arrival     Transfers Overall transfer level: Needs assistance Equipment used: None Transfers: Sit to/from Stand Sit to Stand: Contact guard assist, Min assist           General transfer comment: light min A to steady with fatigue to sit safely    Ambulation/Gait Ambulation/Gait assistance: Min assist, Contact guard assist, Mod assist Gait Distance (Feet): 250 Feet Assistive device: None Gait Pattern/deviations: Step-through pattern, Scissoring, Drifts right/left, Narrow base of support Gait velocity: decr     General Gait Details: varying levels of asssit to maintain balance with up to mod A as pt easily distractible and with LOB x2 needing mod A to prevent falling, able to accet mild balance challenges with min A to maintain balance with cues to remain focused on task   Stairs             Wheelchair Mobility     Tilt Bed    Modified Rankin (Stroke Patients Only)       Balance Overall balance assessment: Needs assistance Sitting-balance support: Feet supported, Bilateral upper extremity supported Sitting balance-Leahy Scale: Fair Sitting balance - Comments: no LOB this session   Standing balance support: During functional activity, Reliant on assistive device for balance, Bilateral upper extremity supported Standing balance-Leahy Scale: Poor Standing balance comment: dependent on external support Single Leg Stance - Right Leg: 5 Single Leg Stance - Left Leg: 15 Tandem Stance - Right Leg: 60 Tandem Stance - Left Leg: 60 Rhomberg - Eyes Opened: 60 Rhomberg - Eyes Closed: 0 High level balance activites: Side stepping, Backward walking, Direction changes, Turns, Head turns  High Level Balance Comments: able to complete without LOB without no asssit in closed environement with limited distractions in room, LOB x2 needing up to mod A  to correct in hall with head turns and turn 180*            Communication Communication Communication: No apparent difficulties   Cognition Arousal: Alert Behavior During Therapy: WFL for tasks assessed/performed   PT - Cognitive impairments: History of cognitive impairments                       PT - Cognition Comments: STM deficits noted, per family history of demtia like sypmtoms, benefits from minimally distracting environement Following commands: Impaired (requiring multimodal cues and increased time) Following commands impaired: Follows one step commands inconsistently, Follows one step commands with increased time    Cueing Cueing Techniques: Verbal cues, Tactile cues  Exercises      General Comments General comments (skin integrity, edema, etc.): VSS on RA, daughter present      Pertinent Vitals/Pain Pain Assessment Pain Assessment: Faces Faces Pain Scale: Hurts little more Pain Location: R knee Pain Descriptors / Indicators: Sore Pain Intervention(s): Monitored during session, Limited activity within patient's tolerance    Home Living                          Prior Function            PT Goals (current goals can now be found in the care plan section) Acute Rehab PT Goals Patient Stated Goal: didn't state PT Goal Formulation: With family Time For Goal Achievement: 12/19/23 Progress towards PT goals: Progressing toward goals    Frequency    Min 3X/week      PT Plan      Co-evaluation              AM-PAC PT "6 Clicks" Mobility   Outcome Measure  Help needed turning from your back to your side while in a flat bed without using bedrails?: A Little Help needed moving from lying on your back to sitting on the side of a flat bed without using bedrails?: A Little Help needed moving to and from a bed to a chair (including a wheelchair)?: A Lot Help needed standing up from a chair using your arms (e.g., wheelchair or bedside chair)?: A Little Help needed to walk in hospital room?: A Lot Help needed climbing 3-5 steps with a railing? : Total 6 Click Score:  14    End of Session Equipment Utilized During Treatment: Gait belt Activity Tolerance: Patient tolerated treatment well Patient left: in chair;with call bell/phone within reach;with family/visitor present;with nursing/sitter in room Nurse Communication: Mobility status PT Visit Diagnosis: Unsteadiness on feet (R26.81);Difficulty in walking, not elsewhere classified (R26.2)     Time: 1478-2956 PT Time Calculation (min) (ACUTE ONLY): 28 min  Charges:    $Gait Training: 23-37 mins PT General Charges $$ ACUTE PT VISIT: 1 Visit                     Amber Cruz R. PTA Acute Rehabilitation Services Office: [phone]   Catalina Antigua 12/07/2023, 10:52 AM

## 2023-12-07 NOTE — Progress Notes (Signed)
 PROGRESS NOTE Amber Cruz  ZOX:[id number] DOB: 01/25/1956 DOA: 12/01/2023 PCP: Lenox Ponds, MD  Brief Narrative/Hospital Course: [id number]  W/ mDD, Anxiety, memory issues (family is describing what sounds like possible dementia) VWB disease, chronic pain / hemicrani continua who was BIB EMS 12/01/23 and decreased mental status around 7 in am.Family members had tried calling/texting from about 0830 onward and she did not answer/reply, which was unusual, prompting police welfare check. She was found w GCS 3, HR 40s, SBP 70s, and received narcan and glucagon without great improvement. Reportedly some pink pills on the ground at home Patient was intubated in the ED and admitted to ICU and subsequently extubated 3/8, managed with high-dose insulin, Levophed and treated for intentional beta-blocker overdose and also on chronic benzodiazepines/abuse at home and some evidence of volume overload.  Psychiatry was consulted due to patient's suicidal intent/action overdose.  Patient working with PT OT-- CIR prior to geripsych?    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  Vitals at this time stabilized.  Psychiatry has seen the patient-plan for [facility] psychiatric admission once bed available.   -meds per psych  Acute respiratory failure/volume overload: Initially intermittently ED subsequently extubated and doing well   Hypothyroidism: -patient says she was taking synthroid at home -resume   Memory impaired remains/MCI: Patient has been forgetful for  the last year and have, since recently retiring mental status somewhat worsening.?  Undiagnosed dementia  Class II Obesity: Estimated body mass index is 38.04 kg/m as calculated from the following:   Height as of this encounter: 5\' 6"  (1.676 m).   Weight as of this encounter: 106.9 kg.   Foley catheter in place: Needs OOB and voiding trial  DVT prophylaxis: heparin injection 5,000 Units Start: 12/01/23 1715 SCDs Start: 12/01/23 1701 Code Status:   Code Status: Do not attempt resuscitation (DNR) PRE-ARREST INTERVENTIONS DESIRED Family Communication: patient   Dispo: The patient is from: home alone            Anticipated disposition: Geriatric psychiatry facility/?  CIR? Objective: Vitals last 24 hrs: Vitals:   12/07/23 0431 12/07/23 0432 12/07/23 0433 12/07/23 0756  BP:    (!) 130/57  Pulse: 69 68 69 65  Resp:    16  Temp:    97.7 F (36.5 C)  TempSrc:    Oral  SpO2: 90% (!) 89% (!) 89% (!) 88%  Weight:      Height:         Physical Examination:  General: Appearance:    Obese female in no acute distress     Lungs:      respirations unlabored  Heart:    Normal heart rate.   MS:   All extremities are intact.   Neurologic:   Awake, alert     Medications reviewed:  Scheduled Meds:  Chlorhexidine Gluconate Cloth  6 each Topical Daily   clonazePAM  1 mg Oral BID   heparin  5,000 Units Subcutaneous Q8H   levothyroxine  137 mcg Oral Q0600   pneumococcal 20-valent conjugate vaccine  0.5 mL Intramuscular Tomorrow-1000   sertraline  25 mg Oral Daily  sodium chloride flush  10-40 mL Intracatheter Q12H      Intake/Output Summary (Last 24 hours) at 12/07/2023 0828 Last data filed at 12/07/2023 4782 Gross per 24 hour  Intake 1200 ml  Output 1475 ml  Net -275 ml   Net IO Since Admission: -235.75 mL [12/07/23 0828]  Wt Readings from Last 3 Encounters:  12/06/23 106.9 kg  11/17/23 95.3 kg  10/20/23 99.8 kg     Data Reviewed: I have personally reviewed following labs  and imaging studies CBC: Recent Labs  Lab 12/01/23 1513 12/01/23 1532 12/02/23 0538 12/02/23 2227 12/04/23 0354  WBC 8.3  --  14.3*  --  10.7*  NEUTROABS 6.5  --   --   --   --   HGB 12.3 13.6 12.2 11.6* 13.2  HCT 38.5 40.0 37.9 34.0* 38.9  MCV 92.5  --  91.3  --  87.0  PLT 178  --  193  --  183   Basic Metabolic Panel:  Recent Labs  Lab 12/01/23 1831 12/01/23 2233 12/02/23 0538 12/02/23 0829 12/03/23 1757 12/04/23 0020 12/04/23 0306 12/05/23 0633 12/06/23 0243  NA  --    < > 136   < > 128* 131* 131* 141 136  K  --    < > 3.1*   < > 4.0 4.0 4.4 3.7 3.7  CL  --    < > 111   < > 100 101 99 103 100  CO2  --    < > 23   < > 21* 22 20* 35* 26  GLUCOSE  --    < > 166*   < > 186* 137* 180* 123* 129*  BUN  --    < > 6*   < > <5* <5* <5* 10 13  CREATININE  --    < > 0.86   < > 0.79 0.81 0.78 0.79 0.66  CALCIUM  --    < > 8.3*   < > 8.6* 8.2* 8.4* 8.9 8.8*  MG 2.2  --  1.5*  --  1.9  --  2.2  --   --   PHOS 2.9  --  <1.0*  --  2.0*  --  3.0 3.3 3.6   < > = values in this interval not displayed.   GFR: Estimated Creatinine Clearance: 82 mL/min (by C-G formula based on SCr of 0.66 mg/dL). Liver Function Tests:  Recent Labs  Lab 12/01/23 1513 12/01/23 2233 12/02/23 0538 12/03/23 1757 12/04/23 0306 12/05/23 0633 12/06/23 0243  AST 22 PATIENT IDENTIFICATION ERROR. PLEASE DISREGARD RESULTS. ACCOUNT WILL BE CREDITED. 32 41  --   --   --   ALT 16 PATIENT IDENTIFICATION ERROR. PLEASE DISREGARD RESULTS. ACCOUNT WILL BE CREDITED. 25 29  --   --   --   ALKPHOS 33* PATIENT IDENTIFICATION ERROR. PLEASE DISREGARD RESULTS. ACCOUNT WILL BE CREDITED. 30* 40  --   --   --   BILITOT 0.8 PATIENT IDENTIFICATION ERROR. PLEASE DISREGARD RESULTS. ACCOUNT WILL BE CREDITED. 0.7 0.8  --   --   --   PROT 5.3* PATIENT IDENTIFICATION ERROR. PLEASE DISREGARD RESULTS. ACCOUNT WILL BE CREDITED. 4.8* 5.4*  --   --   --   ALBUMIN 3.1* PATIENT IDENTIFICATION ERROR. PLEASE DISREGARD RESULTS. ACCOUNT WILL BE  CREDITED. 2.8* 2.9* 2.8* 2.9* 2.6*   No results for input(s): "LIPASE", "AMYLASE" in the last 168 hours. No results for input(s): "AMMONIA" in the last 168 hours. Coagulation Profile:  Recent Labs  Lab 12/04/23 0354  INR 1.0   No results for input(s): "PROBNP" in the last 168 hours.  No results for input(s): "HGBA1C" in the last 72 hours. Recent Labs  Lab 12/05/23 2027 12/05/23 2344 12/06/23 0500 12/06/23 1121 12/06/23 1651  GLUCAP 127* 123* 118* 162* 148*   No results for input(s): "CHOL", "HDL", "LDLCALC", "TRIG", "CHOLHDL", "LDLDIRECT" in the last 72 hours. No results for input(s): "TSH", "T4TOTAL", "FREET4", "T3FREE", "THYROIDAB" in the last 72 hours.  Sepsis Labs: Recent Labs  Lab 12/01/23 1700 12/05/23 5784  LATICACIDVEN 0.7 0.9   Recent Results (from the past 240 hours)  MRSA Next Gen by PCR, Nasal     Status: None   Collection Time: 12/01/23  6:04 PM   Specimen: Nasal Mucosa; Nasal Swab  Result Value Ref Range Status   MRSA by PCR Next Gen NOT DETECTED NOT DETECTED Final    Comment: (NOTE) The GeneXpert MRSA Assay (FDA approved for NASAL specimens only), is one component of a comprehensive MRSA colonization surveillance program. It is not intended to diagnose MRSA infection nor to guide or monitor treatment for MRSA infections. Test performance is not FDA approved in patients less than 41 years old. Performed at [facility], [address] [place], [place] [address]   Culture, blood (Routine X 2) w Reflex to ID Panel     Status: None (Preliminary result)   Collection Time: 12/04/23  3:52 AM   Specimen: BLOOD LEFT HAND  Result Value Ref Range Status   Specimen Description BLOOD LEFT HAND  Final   Special Requests   Final    BOTTLES DRAWN AEROBIC AND ANAEROBIC Blood Culture results may not be optimal due to an inadequate volume of blood received in culture bottles   Culture   Final    NO GROWTH 2 DAYS Performed at [facility], [address] [place], [place] [address]    Report Status PENDING  Incomplete  Culture, blood (Routine X 2) w Reflex to ID Panel     Status: None (Preliminary result)   Collection Time: 12/04/23  3:54 AM   Specimen: BLOOD RIGHT HAND  Result Value Ref Range Status   Specimen Description BLOOD RIGHT HAND  Final   Special Requests   Final    BOTTLES DRAWN AEROBIC AND ANAEROBIC Blood Culture results may not be optimal due to an inadequate volume of blood received in culture bottles   Culture   Final    NO GROWTH 2 DAYS Performed at [facility], [address], [place], [place] [address]    Report Status PENDING  Incomplete       Radiology Studies: No results found.   LOS: 6 days   Total time spent in review of labs and imaging, patient evaluation, formulation of plan, documentation and communication with family: 35 minutes  Joseph Art, DO  [facility]  12/07/2023, 8:28 AM

## 2023-12-07 NOTE — Progress Notes (Signed)
   12/07/23 1601  Spiritual Encounters  Type of Visit Initial  Care provided to: Pt and family  Conversation partners present during encounter  Psychiatrist)  Reason for visit Routine spiritual support   Chaplain responding to spiritual consult for Prayer and took the opportunity to conduct a spiritual consult  Chaplain Assessment  Chaplain's Reason for Visit: Chaplain responded to Spiritual Consult for Prayer.  Chaplain had prior relationship with Pt's daughter as this chaplain was oncall the night Pt was brought to the ED  Chaplain time spent: 45 minutes  Chaplain Interventions: Cultivated a relationship of care and support Explored Pt's emotional and spiritual needs and resources Normalized Pt experience and emotions Facilitated storytelling and life review Practiced empathetic listening and reflective feedback Provided compassionate presence and touch (hand holding) Chaplain provided the ritual of prayer as requested  Chaplain Assessment: Pt Needs Spiritual: Pt asks for prayer from chaplain but did not mention what [place] tradition she is associated with  Experiencing Lack of Purpose Lack of peace Lack of meaning  Emotional: Pt is aware of declining cognitive issues and is experiencing anxiety and fear as a result. Confusion Frustration Loneliness Grief over career loss, loss of work family Anticipatory grief regarding her declining cognition  Relational: Pt states she lacks companionship like she had when she was working Pt moved to new location to be closer to family but is no further from friends.  Intermediate hopes: hoping a medical reason for her confusion can be found and she can regain her memory. 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  Chaplaincy Plan: Patient will likely need continued and repeated support as her cognitive decline worsens.  This chaplain will continue to provide care and empathetic listening as long as the Pt is here.  Chaplain services remain available by Spiritual Consult or for emergent cases, paging [phone]  Chaplain Raelene Bott, MDiv [email or web address]
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
## 2023-12-07 NOTE — Plan of Care (Signed)
  Problem: Education: Goal: Ability to describe self-care measures that may prevent or decrease complications (Diabetes Survival Skills Education) will improve Outcome: Progressing Goal: Individualized Educational Video(s) Outcome: Progressing   Problem: Coping: Goal: Ability to adjust to condition or change in health will improve Outcome: Progressing   Problem: Fluid Volume: Goal: Ability to maintain a balanced intake and output will improve Outcome: Progressing   Problem: Health Behavior/Discharge Planning: Goal: Ability to identify and utilize available resources and services will improve Outcome: Progressing Goal: Ability to manage health-related needs will improve Outcome: Progressing   Problem: Metabolic: Goal: Ability to maintain appropriate glucose levels will improve Outcome: Progressing   Problem: Nutritional: Goal: Maintenance of adequate nutrition will improve Outcome: Progressing Goal: Progress toward achieving an optimal weight will improve Outcome: Progressing   Problem: Skin Integrity: Goal: Risk for impaired skin integrity will decrease Outcome: Progressing   Problem: Tissue Perfusion: Goal: Adequacy of tissue perfusion will improve Outcome: Progressing   Problem: Education: Goal: Knowledge of General Education information will improve Description: Including pain rating scale, medication(s)/side effects and non-pharmacologic comfort measures Outcome: Progressing   Problem: Health Behavior/Discharge Planning: Goal: Ability to manage health-related needs will improve Outcome: Progressing   Problem: Clinical Measurements: Goal: Ability to maintain clinical measurements within normal limits will improve Outcome: Progressing Goal: Will remain free from infection Outcome: Progressing Goal: Diagnostic test results will improve Outcome: Progressing Goal: Respiratory complications will improve Outcome: Progressing Goal: Cardiovascular complication will  be avoided Outcome: Progressing   Problem: Nutrition: Goal: Adequate nutrition will be maintained Outcome: Progressing   Problem: Activity: Goal: Risk for activity intolerance will decrease Outcome: Progressing   Problem: Coping: Goal: Level of anxiety will decrease Outcome: Progressing   Problem: Elimination: Goal: Will not experience complications related to bowel motility Outcome: Progressing Goal: Will not experience complications related to urinary retention Outcome: Progressing   Problem: Pain Managment: Goal: General experience of comfort will improve and/or be controlled Outcome: Progressing   Problem: Safety: Goal: Ability to remain free from injury will improve Outcome: Progressing
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
## 2023-12-07 NOTE — Plan of Care (Signed)
   Problem: Skin Integrity: Goal: Risk for impaired skin integrity will decrease Outcome: Progressing   Problem: Activity: Goal: Risk for activity intolerance will decrease Outcome: Progressing   Problem: Coping: Goal: Level of anxiety will decrease Outcome: Progressing
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
## 2023-12-08 ENCOUNTER — Encounter: Payer: Self-pay | Admitting: Family

## 2023-12-08 ENCOUNTER — Inpatient Hospital Stay
Admission: AD | Admit: 2023-12-08 | Discharge: 2023-12-15 | DRG: 885 | Disposition: A | Discharge status: Home / Self Care | Payer: Self-pay | Source: Intra-hospital | Attending: Psychiatry | Admitting: Psychiatry

## 2023-12-08 ENCOUNTER — Other Ambulatory Visit: Payer: Self-pay

## 2023-12-08 DIAGNOSIS — Z9151 Personal history of suicidal behavior: Secondary | ICD-10-CM

## 2023-12-08 DIAGNOSIS — F0394 Unspecified dementia, unspecified severity, with anxiety: Secondary | ICD-10-CM | POA: Diagnosis present

## 2023-12-08 DIAGNOSIS — Z820 Family history of epilepsy and other diseases of the nervous system: Secondary | ICD-10-CM

## 2023-12-08 DIAGNOSIS — F0393 Unspecified dementia, unspecified severity, with mood disturbance: Secondary | ICD-10-CM | POA: Diagnosis present

## 2023-12-08 DIAGNOSIS — Z9152 Personal history of nonsuicidal self-harm: Secondary | ICD-10-CM | POA: Diagnosis not present

## 2023-12-08 DIAGNOSIS — Z79890 Hormone replacement therapy (postmenopausal): Secondary | ICD-10-CM | POA: Diagnosis not present

## 2023-12-08 DIAGNOSIS — Z8249 Family history of ischemic heart disease and other diseases of the circulatory system: Secondary | ICD-10-CM

## 2023-12-08 DIAGNOSIS — Z630 Problems in relationship with spouse or partner: Secondary | ICD-10-CM | POA: Diagnosis not present

## 2023-12-08 DIAGNOSIS — F332 Major depressive disorder, recurrent severe without psychotic features: Principal | ICD-10-CM | POA: Diagnosis present

## 2023-12-08 DIAGNOSIS — Z91041 Radiographic dye allergy status: Secondary | ICD-10-CM

## 2023-12-08 DIAGNOSIS — Z7984 Long term (current) use of oral hypoglycemic drugs: Secondary | ICD-10-CM | POA: Diagnosis not present

## 2023-12-08 LAB — GLUCOSE, CAPILLARY
Glucose-Capillary: 141 mg/dL — ABNORMAL HIGH (ref 70–99)
Glucose-Capillary: 111 mg/dL — ABNORMAL HIGH (ref 70–99)
Glucose-Capillary: 146 mg/dL — ABNORMAL HIGH (ref 70–99)

## 2023-12-08 MED ORDER — ALUM & MAG HYDROXIDE-SIMETH 200-200-20 MG/5ML PO SUSP: 30.0000 mL | ORAL | Status: DC | PRN

## 2023-12-08 MED ORDER — SERTRALINE HCL 25 MG PO TABS
25.0000 mg | ORAL_TABLET | Freq: Every day | ORAL | Status: DC
  Administered 2023-12-09 – 2023-12-15 (×7): 25 mg via ORAL
  Filled 2023-12-08 (×7): qty 1

## 2023-12-08 MED ORDER — MAGNESIUM HYDROXIDE 400 MG/5ML PO SUSP: 30.0000 mL | Freq: Every day | ORAL | Status: DC | PRN

## 2023-12-08 MED ORDER — OLANZAPINE 10 MG IM SOLR: 2.5000 mg | Freq: Two times a day (BID) | INTRAMUSCULAR | Status: DC | PRN

## 2023-12-08 MED ORDER — OLANZAPINE 5 MG PO TBDP
5.0000 mg | ORAL_TABLET | Freq: Three times a day (TID) | ORAL | Status: DC | PRN
  Administered 2023-12-08 – 2023-12-12 (×2): 5 mg via ORAL
  Filled 2023-12-08 (×2): qty 1

## 2023-12-08 MED ORDER — LORAZEPAM 0.5 MG PO TABS
0.5000 mg | ORAL_TABLET | Freq: Four times a day (QID) | ORAL | Status: DC | PRN
Start: 2023-12-08 — End: 2023-12-15
  Administered 2023-12-08 – 2023-12-14 (×4): 0.5 mg via ORAL
  Filled 2023-12-08 (×4): qty 1

## 2023-12-08 MED ORDER — CLONAZEPAM 1 MG PO TABS
1.0000 mg | ORAL_TABLET | Freq: Two times a day (BID) | ORAL | Status: DC
  Administered 2023-12-08 – 2023-12-15 (×14): 1 mg via ORAL
  Filled 2023-12-08 (×14): qty 1

## 2023-12-08 MED ORDER — LEVOTHYROXINE SODIUM 137 MCG PO TABS
137.0000 ug | ORAL_TABLET | Freq: Every day | ORAL | Status: DC
  Administered 2023-12-09 – 2023-12-15 (×8): 137 ug via ORAL
  Filled 2023-12-08 (×7): qty 1

## 2023-12-08 MED ORDER — LORAZEPAM 2 MG/ML IJ SOLN: 0.5000 mg | Freq: Four times a day (QID) | INTRAMUSCULAR | Status: DC | PRN

## 2023-12-08 MED ORDER — ACETAMINOPHEN 325 MG PO TABS
650.0000 mg | ORAL_TABLET | Freq: Four times a day (QID) | ORAL | Status: DC | PRN
  Administered 2023-12-09 – 2023-12-13 (×2): 650 mg via ORAL
  Filled 2023-12-08 (×2): qty 2

## 2023-12-08 NOTE — Plan of Care (Signed)
   Problem: Education: Goal: Knowledge of [facility] Education information/materials will improve Outcome: Not Progressing Goal: Emotional status will improve Outcome: Not Progressing Goal: Mental status will improve Outcome: Not Progressing Goal: Verbalization of understanding the information provided will improve Outcome: Not Progressing

## 2023-12-08 NOTE — Consult Note (Signed)
 Client initially refused to sign the voluntary commitment form but then she did.  This practitioner went to see her and answered her questions about [facility]'s gero unit.  Encouragement and reassurance provided as she was very anxious about the transfer.  Her husband and daughter were at the bedside with answers provided to their questions also.  The client is in agreement to transfer.  The family wanted the next facility to know that she does have sundowning with confusion in the afternoon and evening with some agitation at times.  This provider will chat Dr. Sandria Senter who is there today to let her know.  Nanine Means, PMHNP

## 2023-12-08 NOTE — Group Note (Signed)
 Date:  12/08/2023 Time:  10:16 PM  Group Topic/Focus:  Goals Group:   The focus of this group is to help patients establish daily goals to achieve during treatment and discuss how the patient can incorporate goal setting into their daily lives to aide in recovery.    Participation Level:  Minimal  Participation Quality:  Inattentive  Affect:  Blunted and Flat  Cognitive:  Appropriate  Insight: Appropriate  Engagement in Group:  Limited  Modes of Intervention:  Discussion, Rapport Building, Socialization, and Support  Additional Comments:     Amber Cruz 12/08/2023, 10:16 PM
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
## 2023-12-08 NOTE — Progress Notes (Signed)
 Handoff report given to [facility].

## 2023-12-08 NOTE — TOC Transition Note (Signed)
 Transition of Care [facility]) - Discharge Note   Patient Details  Name: Amber Cruz MRN: [id number] Date of Birth: June 04, 1956  Transition of Care [facility]) CM/SW Contact:  Michaela Corner, LCSWA Phone Number: 12/08/2023, 11:27 AM   Clinical Narrative:   Patient will DC to: [facility] geripsych unit  Anticipated DC date: 12/08/23 Family notified: Aundra Millet (dtr) and [name] (spouse) Transport by: [facility]   Per MD patient ready for DC to [facility] geripsych unit. RN to call report prior to discharge (call to report [phone] ;bed 35 ). RN, patient, patient's family, and facility notified of DC. Discharge Summary and FL2 sent to facility. DC packet on chart. [facility] transport requested for patient.   CSW will sign off for now as social work intervention is no longer needed. Please consult [name] again if new needs arise.   Final next level of care: Other (comment) ([facility] geruopsych unti) Barriers to Discharge: Barriers Resolved   Patient Goals and CMS Choice Patient states their goals for this hospitalization and ongoing recovery are:: Unable to assess          Discharge Placement              Patient chooses bed at: Other - please specify in the comment section below: Patient to be transferred to facility by: [facility] Name of family member notified: [name] (dtr) and [name] (spouse) Patient and family notified of of transfer: 12/08/23  Discharge Plan and Services Additional resources added to the After Visit Summary for   In-house Referral: Clinical Social Work                                   Social Drivers of Health (SDOH) Interventions SDOH Screenings   Food Insecurity: No Food Insecurity (12/04/2023)  Housing: Patient Unable To Answer (12/04/2023)  Transportation Needs: Patient Unable To Answer (12/05/2023)  Utilities: Patient Unable To Answer (12/05/2023)  Financial Resource Strain: Low Risk  (04/21/2022)   [facility]  prior to 11/26/2022., [facility]  Physical Activity: Insufficiently Active (04/21/2022)   [facility] visits prior to 11/26/2022., [facility]  Social Connections: Unknown (12/05/2023)  Stress: No Stress Concern Present (04/21/2022)   [facility] visits prior to 11/26/2022., [facility]  Tobacco Use: Low Risk  (12/01/2023)     Readmission Risk Interventions     No data to display

## 2023-12-08 NOTE — Progress Notes (Signed)
 Mobility Specialist Progress Note:   12/08/23 1130  Mobility  Activity Ambulated with assistance in hallway  Level of Assistance Minimal assist, patient does 75% or more  Assistive Device Other (Comment) (HHA)  Distance Ambulated (ft) 200 ft  Activity Response Tolerated well  Mobility Referral Yes  Mobility visit 1 Mobility  Mobility Specialist Start Time (ACUTE ONLY) 1130  Mobility Specialist Stop Time (ACUTE ONLY) 1145  Mobility Specialist Time Calculation (min) (ACUTE ONLY) 15 min   Pt agreeable to mobility session. Required only minA once to correct x1 LOB, otherwise only CGA required. Pt c/o R knee meniscus pain d/t previous injury. Otherwise, no c/o. Pt back in chair with all needs met, sitter present.   Addison Lank Mobility Specialist Please contact via SecureChat or  Rehab office at [phone]

## 2023-12-08 NOTE — Plan of Care (Signed)
  Problem: Education: Goal: Ability to describe self-care measures that may prevent or decrease complications (Diabetes Survival Skills Education) will improve Outcome: Progressing Goal: Individualized Educational Video(s) Outcome: Progressing   Problem: Coping: Goal: Ability to adjust to condition or change in health will improve Outcome: Progressing   Problem: Fluid Volume: Goal: Ability to maintain a balanced intake and output will improve Outcome: Progressing   Problem: Health Behavior/Discharge Planning: Goal: Ability to identify and utilize available resources and services will improve Outcome: Progressing Goal: Ability to manage health-related needs will improve Outcome: Progressing   Problem: Skin Integrity: Goal: Risk for impaired skin integrity will decrease Outcome: Progressing   Problem: Education: Goal: Knowledge of General Education information will improve Description: Including pain rating scale, medication(s)/side effects and non-pharmacologic comfort measures Outcome: Progressing   Problem: Nutrition: Goal: Adequate nutrition will be maintained Outcome: Progressing   Problem: Coping: Goal: Level of anxiety will decrease Outcome: Progressing

## 2023-12-08 NOTE — TOC Progression Note (Addendum)
 Transition of Care [facility]) - Progression Note    Patient Details  Name: Amber Cruz MRN: [id number] Date of Birth: 1955-12-24  Transition of Care [facility]) CM/SW Contact  Michaela Corner, [place] Phone Number: 12/08/2023, 8:31 AM  Clinical Narrative:   [facility] has accepted pt for their IP geripsych unit. CSW will inform pt and dtr [name].   Per [facility], pts bed is still available.   1:27 PM Spouse, [name], asked about HCPOA paperwork. CSW contacted Chaplain, they will see patient and family after 2PM.   TOC will continue to follow.    Expected Discharge Plan: Psychiatric Hospital Barriers to Discharge: Psych Bed not available  Expected Discharge Plan and Services In-house Referral: Clinical Social Work     Living arrangements for the past 2 months: Single Family Home Expected Discharge Date: 12/07/23                                     Social Determinants of Health (SDOH) Interventions SDOH Screenings   Food Insecurity: No Food Insecurity (12/04/2023)  Housing: Patient Unable To Answer (12/04/2023)  Transportation Needs: Patient Unable To Answer (12/05/2023)  Utilities: Patient Unable To Answer (12/05/2023)  Financial Resource Strain: Low Risk  (04/21/2022)   [facility] prior to 11/26/2022., [facility]  Physical Activity: Insufficiently Active (04/21/2022)   [facility] visits prior to 11/26/2022., [facility]  Social Connections: Unknown (12/05/2023)  Stress: No Stress Concern Present (04/21/2022)   [facility] visits prior to 11/26/2022., [facility]  Tobacco Use: Low Risk  (12/01/2023)    Readmission Risk Interventions     No data to display

## 2023-12-08 NOTE — Plan of Care (Signed)
  Problem: Coping: Goal: Level of anxiety will decrease Outcome: Progressing Pt more amenable to verbal reorientation and de-escalation when anxious tonight

## 2023-12-08 NOTE — Tx Team (Signed)
 Initial Treatment Plan 12/08/2023 6:11 PM Amber Cruz ZOX:[id number]    PATIENT STRESSORS: Health problems   Marital or family conflict     PATIENT STRENGTHS: Supportive family/friends    PATIENT IDENTIFIED PROBLEMS:   "I am here for a study."                   DISCHARGE CRITERIA:  Ability to meet basic life and health needs Adequate post-discharge living arrangements Improved stabilization in mood, thinking, and/or behavior Safe-care adequate arrangements made  PRELIMINARY DISCHARGE PLAN: Return to previous living arrangement  PATIENT/FAMILY INVOLVEMENT: This treatment plan has been presented to and reviewed with the patient, Amber Cruz. The patient has been given the opportunity to ask questions and make suggestions.  Luane School, RN 12/08/2023, 6:11 PM

## 2023-12-08 NOTE — Progress Notes (Signed)
 Patient seen and examined this morning She is resting comfortably daughter at the bedside Please review discharge summary from 3/14 Is medically stable for discharge to inpatient psych.  Discharge date: 12/09/23  CODE STATUS: Patient and daughter requesting full code for transfer and can be revisited on admission at psych facility. No yellow DNR sheet needed.  for the wound-skin tear back left- form 3/9>may use for odorous wounds. Cover with roll gauze or foam dressing.

## 2023-12-09 DIAGNOSIS — F332 Major depressive disorder, recurrent severe without psychotic features: Secondary | ICD-10-CM | POA: Diagnosis not present

## 2023-12-09 LAB — CULTURE, BLOOD (ROUTINE X 2)
Culture: NO GROWTH
Culture: NO GROWTH
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
## 2023-12-09 NOTE — Progress Notes (Signed)
   12/08/23 2100  Psych Admission Type (Psych Patients Only)  Admission Status Voluntary  Psychosocial Assessment  Patient Complaints Anxiety  Eye Contact Brief  Facial Expression Worried  Affect Preoccupied;Anxious  Speech Incoherent  Interaction Needy;Intrusive  Motor Activity Fidgety  Appearance/Hygiene Unremarkable  Behavior Characteristics Anxious;Restless  Mood Anxious;Preoccupied  Thought Process  Coherency Tangential  Content Blaming others  Delusions None reported or observed  Perception WDL  Hallucination None reported or observed  Judgment Impaired  Confusion Severe  Danger to Self  Current suicidal ideation? Denies  Danger to Others  Danger to Others None reported or observed   Pt is disorganized and confused. Anxious Affect. Denies SI, HI, AVH, and pain. Scheduled medications administered to patient, per MD orders. Support and encouragement provided.  Routine safety checks conducted every 15 minutes. Patient remains safe at this time.

## 2023-12-09 NOTE — [facility] Suicide Risk Assessment (Signed)
 [facility] Admission Suicide Risk Assessment   Nursing information obtained from:  Patient, Review of record Demographic factors:  Age 68 or older, Caucasian Current Mental Status:  NA Loss Factors:  Decline in physical health Historical Factors:  NA Risk Reduction Factors:  Living with another person, especially a relative  Total Time spent with patient: 1 hour Principal Problem: MDD (major depressive disorder), recurrent episode, severe (HCC) Diagnosis:  Principal Problem:   MDD (major depressive disorder), recurrent episode, severe (HCC)  Subjective Data:   Continued Clinical Symptoms:  Alcohol Use Disorder Identification Test Final Score (AUDIT): 0 The "Alcohol Use Disorders Identification Test", Guidelines for Use in Primary Care, Second Edition.  World Science writer [facility]). Score between 0-7:  no or low risk or alcohol related problems. Score between 8-15:  moderate risk of alcohol related problems. Score between 16-19:  high risk of alcohol related problems. Score 20 or above:  warrants further diagnostic evaluation for alcohol dependence and treatment.   CLINICAL FACTORS:   Depression:   Delusional Previous Psychiatric Diagnoses and Treatments   Musculoskeletal: Strength & Muscle Tone: decreased Gait & Station: unsteady Patient leans: N/A  Psychiatric Specialty Exam:  Presentation  General Appearance:  Appropriate for Environment; Casual  Eye Contact: Fair  Speech: Normal Rate  Speech Volume: Normal  Handedness: Right   Mood and Affect  Mood: Dysphoric; Anxious  Affect: Depressed   Thought Process  Thought Processes: Disorganized  Descriptions of Associations:Intact  Orientation:Partial  Thought Content:Illogical  History of Schizophrenia/Schizoaffective disorder:No data recorded Duration of Psychotic Symptoms:No data recorded Hallucinations:Hallucinations: None  Ideas of Reference:None  Suicidal Thoughts:Suicidal Thoughts:  No  Homicidal Thoughts:Homicidal Thoughts: No   Sensorium  Memory: Immediate Poor; Recent Poor; Remote Poor  Judgment: Impaired  Insight: Shallow   Executive Functions  Concentration: Poor  Attention Span: Fair  Recall: Poor  Fund of Knowledge: Fair  Language: Fair   Psychomotor Activity  Psychomotor Activity: Psychomotor Activity: Normal   Assets  Assets: Communication Skills; Desire for Improvement; Physical Health; Leisure Time   Sleep  Sleep: Sleep: Fair    Physical Exam: Physical Exam ROS Blood pressure (!) 107/51, pulse 69, temperature (!) 97.5 F (36.4 C), resp. rate 18, weight 102.5 kg, SpO2 95%. Body mass index is 36.48 kg/m.   COGNITIVE FEATURES THAT CONTRIBUTE TO RISK:  Thought constriction (tunnel vision)    SUICIDE RISK:   Moderate:  Frequent suicidal ideation with limited intensity, and duration, some specificity in terms of plans, no associated intent, good self-control, limited dysphoria/symptomatology, some risk factors present, and identifiable protective factors, including available and accessible social support.  PLAN OF CARE: Patient is admitted to the [facility] inpatient unit with Q15 min safety check. Multidisciplinary treatment approach offered. Medication management, group/milieu therapy offered  I certify that inpatient services furnished can reasonably be expected to improve the patient's condition.   Verner Chol, MD 12/09/2023, 12:57 PM

## 2023-12-09 NOTE — Plan of Care (Signed)
 D: Pt alert and oriented. Pt endorses experiencing anxiety however, denies experiencing any depression at this time. Pt denies experiencing any pain at this time. Pt denies experiencing any SI/HI, or AVH at this time.   A: Scheduled medications administered to pt, per MD orders. Support and encouragement provided. Frequent verbal contact made. Routine safety checks conducted q15 minutes.   R: No adverse drug reactions noted. Pt verbally contracts for safety at this time. Pt compliant with medications and treatment plan. Pt interacts well with others on the unit. Pt remains safe at this time. Plan of care ongoing.  Pt is forgetful. This Clinical research associate has given her her daughter's phone number multiple times. Pt has misplace the number numerous times. Pt continues to ask for her cell phone. Pt has been informed multiple times that it was not in her belongings.   Problem: Education: Goal: Emotional status will improve Outcome: Not Progressing Goal: Mental status will improve Outcome: Not Progressing

## 2023-12-10 DIAGNOSIS — F332 Major depressive disorder, recurrent severe without psychotic features: Secondary | ICD-10-CM | POA: Diagnosis not present

## 2023-12-10 NOTE — Group Note (Signed)
 Date:  12/10/2023 Time:  9:36 PM  Group Topic/Focus:  Wrap-Up Group:   The focus of this group is to help patients review their daily goal of treatment and discuss progress on daily workbooks.    Participation Level:  Active  Participation Quality:  Appropriate  Affect:  Appropriate  Cognitive:  Appropriate  Insight: Good  Engagement in Group:  Engaged  Modes of Intervention:  Discussion  Additional Comments:    Ian Castagna Luretha Rued 12/10/2023, 9:36 PM

## 2023-12-10 NOTE — Progress Notes (Signed)
 Patient is an involuntary admission (changed today) for MDD with overdose of medications.  Was originally admitted and intubated.  Per family was having signs of early dementia and lost her job a few weeks ago.  Currently patient is moderately confused.  Endorsed anxiety today for which she was medicated.  Started crying because she was having comprehension problems. Patient denies SI, HI, AVH, and depression. Patient states she doesn't remember overdosing, but also states she took too much medication "over time throughout the day and just took too much". Patient interacts well with peers and staff.  Will continue to monitor.

## 2023-12-10 NOTE — Progress Notes (Signed)
 Patient disorganized and confused. Rates anxiety level 2/10. States she's feeling much better. Interacting with peers and staff. Attends group. Scheduled medications administered to patient, per MD orders. Routine safety checks conducted every 15 minutes. Patient interacts well with others on the unit.  Patient remains safe at this time.

## 2023-12-10 NOTE — Group Note (Signed)
 Date:  12/10/2023 Time:  3:05 PM  Group Topic/Focus:  Outside Rec/Group Movement The purpose of this group is to allow patients to go outside and get fresh air while listening to relaxing music.   Participation Level:  Active  Participation Quality:  Appropriate  Affect:  Appropriate  Cognitive:  Alert and Appropriate  Insight: Appropriate  Engagement in Group:  Engaged  Modes of Intervention:  Activity  Additional Comments:    Marta Antu 12/10/2023, 3:05 PM
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
## 2023-12-10 NOTE — Progress Notes (Signed)
 [facility] MD Progress Note  12/10/2023 12:00 PM Amber Cruz  MRN:  [id number] Amber Cruz is a 68 y.o. female admitted: Medicallyfor 12/01/2023  2:45 PM with loss of consciousness after overdose. 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 Psychiatry consulted for suicide attempt. Per chart,Patient was in ICU and transferred to medical floor for monitoring. Once patient was medically cleared she was transfer to [facility] psych unit for stabilization.   Subjective:  Chart reviewed, case discussed in multidisciplinary meeting, patient seen during rounds.  Patient reports doing well.  She reports that she was able to get some rest last night.  Per nursing she is getting along fine with other peers.  Patient is unable to recall the events that happened that led up to the admission to the geropsych unit.  Provider tried to remind her about her overdose, her admission to the medical floor, her recent retirement.  After listening to that patient stated that she remembers some things.  She remembers taking the medications to calm herself down.  She talks about marital conflict with her husband.  She reports that she trusts her daughter.  She denies SI/HI/intent/plan.  She denies auditory/visual hallucinations.  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   Sleep: Fair  Appetite:  Fair  Past Psychiatric History: see h&P Family History:  Family History  Problem Relation Age of Onset   Alzheimer's disease Mother    Hypertension Father    Heart failure Father    Social History:  Social History   Substance and Sexual Activity  Alcohol Use No     Social History   Substance and Sexual Activity  Drug Use No    Social History   Socioeconomic History   Marital status: Married    Spouse name: Not on file   Number of children:  Not on file   Years of education: Not on file   Highest education level: Not on file  Occupational History   Not on file  Tobacco Use   Smoking status: Never   Smokeless tobacco: Never  Vaping Use   Vaping status: Never Used  Substance and Sexual Activity   Alcohol use: No   Drug use: No   Sexual activity: Not Currently    Birth control/protection: None  Other Topics Concern   Not on file  Social History Narrative   Not on file   Social Drivers of Health   Financial Resource Strain: Low Risk  (04/21/2022)   Received from [facility] prior to [facility]26[facility]20[facility]   Overall Financial Resource Strain (CARDIA)    Difficulty of Paying Living Expenses: Not hard at all  Food Insecurity: Patient Unable To Answer (12/08/2023)   Hunger Vital Sign    Worried About Running Out of Food in the Last Year: Patient unable to answer    Ran Out of Food in the Last Year: Patient unable to answer  Transportation Needs: Patient Unable To Answer (12/08/2023)   PRAPARE - Transportation    Lack of Transportation (Medical): Patient unable to answer    Lack of Transportation (Non-Medical): Patient unable to answer  Physical Activity: Insufficiently Active (04/21/2022)   [facility]   Exercise Vital Sign    Days of Exercise per Week: 4 days  Minutes of Exercise per Session: 20 min  Stress: No Stress Concern Present (04/21/2022)   [facility] to [facility]26/2022[facility]   [employer] of Occupational Health - Occupational Stress Questionnaire    Feeling of Stress : Only a little  Social Connections: Unknown (12/08/2023)   Social Connection and Isolation Panel [NHANES]    Frequency of Communication with Friends and Family: Patient unable to answer    Frequency of Social Gatherings with Friends and Family: Patient unable to answer    Attends  Religious Services: Patient unable to answer    Active Member of Clubs or Organizations: Patient declined    Attends Banker Meetings: Patient unable to answer    Marital Status: Married   Past Medical History:  Past Medical History:  Diagnosis Date   Aneurysm (HCC)    Anxiety    Frequent infections of left ear    Thyroid disease     Past Surgical History:  Procedure Laterality Date   DESCENDING AORTIC ANEURYSM REPAIR W/ STENT     MYRINGOTOMY Left 2018    Current Medications: Current Facility-Administered Medications  Medication Dose Route Frequency Provider Last Rate Last Admin   acetaminophen (TYLENOL) tablet 650 mg  650 mg Oral Q6H PRN Maryagnes Amos, FNP   650 mg at 12/09/23 2002   alum & mag hydroxide-simeth (MAALOX/MYLANTA) 200-200-20 MG/5ML suspension 30 mL  30 mL Oral Q4H PRN Starkes-Perry, Juel Burrow, FNP       clonazePAM (KLONOPIN) tablet 1 mg  1 mg Oral BID Maryagnes Amos, FNP   1 mg at 12/10/23 0844   levothyroxine (SYNTHROID) tablet 137 mcg  137 mcg Oral Q0600 Maryagnes Amos, FNP   137 mcg at 12/10/23 1610   LORazepam (ATIVAN) injection 0.5 mg  0.5 mg Intravenous Q6H PRN Maryagnes Amos, FNP       LORazepam (ATIVAN) tablet 0.5 mg  0.5 mg Oral Q6H PRN Verner Chol, MD   0.5 mg at 12/08/23 1729   magnesium hydroxide (MILK OF MAGNESIA) suspension 30 mL  30 mL Oral Daily PRN Starkes-Perry, Juel Burrow, FNP       OLANZapine (ZYPREXA) injection 2.5 mg  2.5 mg Intramuscular BID PRN Starkes-Perry, Juel Burrow, FNP       OLANZapine zydis (ZYPREXA) disintegrating tablet 5 mg  5 mg Oral TID PRN Maryagnes Amos, FNP   5 mg at 12/08/23 1729   sertraline (ZOLOFT) tablet 25 mg  25 mg Oral Daily Maryagnes Amos, FNP   25 mg at 12/10/23 9604    Lab Results:  Results for orders placed or performed during the hospital encounter of 12/01/23 (from the past 48 hours)  Glucose, capillary     Status: Abnormal   Collection Time: 12/08/23   1:48 PM  Result Value Ref Range   Glucose-Capillary 146 (H) 70 - 99 mg/dL    Comment: Glucose reference range applies only to samples taken after fasting for at least 8 hours.    Blood Alcohol level:  Lab Results  Component Value Date   ETH <10 12/01/2023    Metabolic Disorder Labs:  No results found for: "PROLACTIN" Lab Results  Component Value Date   TRIG 33 12/02/2023    Physical Findings: AIMS:  , ,  ,  ,    CIWA:    COWS:      Psychiatric Specialty Exam:  Presentation  General Appearance:  Appropriate for Environment; Casual  Eye Contact:  Fair  Speech: Clear and Coherent  Speech Volume: Normal    Mood and Affect  Mood: Anxious  Affect: Depressed   Thought Process  Thought Processes: Coherent  Descriptions of Associations:Intact  Orientation:Full (Time, Place and Person)  Thought Content:Illogical  Hallucinations:Hallucinations: None  Ideas of Reference:None  Suicidal Thoughts:Suicidal Thoughts: No  Homicidal Thoughts:Homicidal Thoughts: No   Sensorium  Memory: Immediate Poor; Recent Poor; Remote Poor  Judgment: Impaired  Insight: Shallow   Executive Functions  Concentration: Fair  Attention Span: Fair  Recall: Poor  Fund of Knowledge: Poor  Language: Fair   Psychomotor Activity  Psychomotor Activity: Psychomotor Activity: Normal  Musculoskeletal: Strength & Muscle Tone: within normal limits Gait & Station: normal Assets  Assets: Manufacturing systems engineer; Desire for Improvement; Resilience    Physical Exam: Physical Exam Vitals and nursing note reviewed.  HENT:     Head: Normocephalic.     Mouth/Throat:     Mouth: Mucous membranes are moist.  Eyes:     Pupils: Pupils are equal, round, and reactive to light.  Cardiovascular:     Rate and Rhythm: Normal rate.     Pulses: Normal pulses.  Pulmonary:     Breath sounds: Normal breath sounds.  Abdominal:     General: Bowel sounds are normal.   Neurological:     General: No focal deficit present.     Mental Status: She is alert.    Review of Systems  Constitutional: Negative.   HENT: Negative.    Eyes: Negative.   Respiratory: Negative.    Cardiovascular: Negative.   Gastrointestinal: Negative.   Skin: Negative.   Neurological: Negative.    Blood pressure (!) 111/57, pulse 72, temperature (!) 97.3 F (36.3 C), resp. rate 18, weight 102.5 kg, SpO2 95%. Body mass index is 36.48 kg/m.  Diagnosis: Principal Problem:   MDD (major depressive disorder), recurrent episode, severe (HCC)  Clinical Decision Making: Patient with history of major neurocognitive disorder, depression initially admitted to ICU and medical floor status post overdose on multiple medications and attempt to die.  Patient was seen by psychiatry on the medical floor and determined she needs inpatient psychiatric hospitalization for further stabilization.  On assessment patient has significant cognitive problems where she is unable to remember her hospitalization in ICU, overdose.  She did talk about marital conflict is an ongoing stressor.  Patient remains high risk for suicide and requires inpatient psychiatric hospitalization.   Treatment Plan Summary: Patient is displaying significant amount of memory problems, unable to recall the reason for psychiatric admission, unable to recall her hospitalization in ICU and medical unit due to overdose unable to answer orientation questions appropriately, it is in my clinical opinion that patient lacks capacity to make medical decisions at this time.  Will discuss with family and initiating involuntary commitment.   Safety and Monitoring:             -- Patient admitted on voluntary admission to inpatient psychiatric unit for safety, stabilization and treatment IVC will be initiated on 12/10/2023 until patient and recovers to a point of oriented x 4 and able to understand her treatment plan.             -- Daily contact with  patient to assess and evaluate symptoms and progress in treatment             -- Patient's case to be discussed in multi-disciplinary team meeting             -- Observation Level:  q15 minute checks             -- Vital signs:  q12 hours             -- Precautions: suicide, elopement, and assault   2. Psychiatric Diagnoses and Treatment:               Will resume home medications after discussing with the family   -- The risks/benefits/side-effects/alternatives to this medication were discussed in detail with the patient and time was given for questions. The patient consents to medication trial.                -- Metabolic profile and EKG monitoring obtained while on an atypical antipsychotic (BMI: Lipid Panel: HbgA1c: QTc:)              -- Encouraged patient to participate in unit milieu and in scheduled group therapies                            3. Medical Issues Being Addressed:  No urgent medical need is noted   4. Discharge Planning:              -- Social work and case management to assist with discharge planning and identification of hospital follow-up needs prior to discharge             -- Estimated LOS: 5-7 days             -- Discharge Concerns: Need to establish a safety plan; Medication compliance and effectiveness             -- Discharge Goals: Return home with outpatient referrals follow ups   Physician Treatment Plan for Primary Diagnosis: MDD (major depressive disorder), recurrent episode, severe (HCC) Major Neurocognitive disorder   Long Term Goal(s): Improvement in symptoms so as ready for discharge   Short Term Goals: Ability to identify changes in lifestyle to reduce recurrence of condition will improve, Ability to verbalize feelings will improve, Ability to disclose and discuss suicidal ideas, Ability to demonstrate self-control will improve, and Ability to identify and develop effective coping behaviors will improve   Physician Treatment Plan for Secondary  Diagnosis: Principal Problem:   MDD (major depressive disorder), recurrent episode, severe (HCC)   Long Term Goal(s): Improvement in symptoms so as ready for discharge   Short Term Goals: Ability to identify changes in lifestyle to reduce recurrence of condition will improve, Ability to verbalize feelings will improve, Ability to disclose and discuss suicidal ideas, Ability to demonstrate self-control will improve, and Ability to identify and develop effective coping behaviors will improve  Verner Chol, MD 12/10/2023, 12:00 PM

## 2023-12-10 NOTE — Progress Notes (Signed)
   12/09/23 2200  Psych Admission Type (Psych Patients Only)  Admission Status Voluntary  Psychosocial Assessment  Patient Complaints Anxiety  Eye Contact Fair  Facial Expression Anxious  Affect Anxious  Speech Logical/coherent  Interaction Assertive  Motor Activity Fidgety  Appearance/Hygiene Unremarkable  Behavior Characteristics Cooperative;Anxious  Mood Pleasant  Thought Process  Coherency Tangential  Content Blaming others  Delusions None reported or observed  Perception WDL  Hallucination None reported or observed  Judgment Impaired  Confusion Severe  Danger to Self  Current suicidal ideation? Denies

## 2023-12-11 DIAGNOSIS — F332 Major depressive disorder, recurrent severe without psychotic features: Secondary | ICD-10-CM

## 2023-12-11 NOTE — Progress Notes (Signed)
 Patient is an involuntary admission to [facility] for MDD with SI attempt.  Was in ICU with intubation prior to discharge to psych.  Has been calm and cooperative with peers and staff.  Mid-day her anxiety usually increases and she takes her prn ativan which relieves the symptoms.  Denies SI, HI, AVH, anxiety and depression. Plan is to discharge Thursday home with family.  Will continue to monitor.

## 2023-12-11 NOTE — [facility] Suicide Risk Assessment (Signed)
 [facility] INPATIENT:  Family/Significant Other Suicide Prevention Education  Suicide Prevention Education:  Education Completed; Howell Pringle, daughter, [phone],  (name of family member/significant other) has been identified by the patient as the family member/significant other with whom the patient will be residing, and identified as the person(s) who will aid the patient in the event of a mental health crisis (suicidal ideations/suicide attempt).  With written consent from the patient, the family member/significant other has been provided the following suicide prevention education, prior to the and/or following the discharge of the patient.  The suicide prevention education provided includes the following: Suicide risk factors Suicide prevention and interventions National Suicide Hotline telephone number [facility] assessment telephone number [facility] Emergency Assistance 911 [facility] and/or Residential Mobile Crisis Unit telephone number  Request made of family/significant other to: Remove weapons (e.g., guns, rifles, knives), all items previously/currently identified as safety concern.   Remove drugs/medications (over-the-counter, prescriptions, illicit drugs), all items previously/currently identified as a safety concern.  The family member/significant other verbalizes understanding of the suicide prevention education information provided.  The family member/significant other agrees to remove the items of safety concern listed above.  Elza Rafter 12/11/2023, 3:12 PM

## 2023-12-11 NOTE — Group Note (Signed)
 Date:  12/11/2023 Time:  11:44 PM  Group Topic/Focus:  Wrap-Up Group:   The focus of this group is to help patients review their daily goal of treatment and discuss progress on daily workbooks.    Participation Level:  Active  Participation Quality:  Appropriate  Affect:  Appropriate  Cognitive:  Appropriate  Insight: Improving  Engagement in Group:  Engaged  Modes of Intervention:  Discussion  Additional Comments:    Amber Cruz 12/11/2023, 11:44 PM

## 2023-12-11 NOTE — B[facility] Counselor (Signed)
 CSW contacted pt's daughter to let her know provider will be discharging pt on Thursday.   Pt's daughter requests a neuro consult after speaking with pt's husband about pt's progress.   CSW will inform provider of pt's family's wishes.   Reynaldo Minium, MSW, [place] 12/11/2023 4:08 PM

## 2023-12-11 NOTE — Group Note (Signed)
 Recreation Therapy Group Note   Group Topic:Stress Management  Group Date: 12/11/2023 Start Time: 1100 End Time: 1140 Facilitators: Rosina Lowenstein, LRT, CTRS Location:  Dayroom  Group Description: PMR (Progressive Muscle Relaxation). LRT educates patients on what PMR is and the benefits that come from it. Patients are asked to sit with their feet flat on the floor while sitting up and all the way back in their chair, if possible. LRT and pts follow a prompt through a speaker that requires you to tense and release different muscles in their body and focus on their breathing. During session, lights are off and soft music is being played. Pts are given a stress ball to use if needed.  LRT and patients discuss how this technique can be used as a coping skill post-discharge.   Goal Area(s) Addressed:  Patients will be able to describe progressive muscle relaxation.  Patient will practice using relaxation technique. Patient will identify a new coping skill.  Patient will follow multistep directions to reduce anxiety and stress   Affect/Mood: Appropriate   Participation Level: Active and Engaged   Participation Quality: Independent   Behavior: Calm and Cooperative   Speech/Thought Process: Coherent   Insight: Good   Judgement: Good   Modes of Intervention: Activity   Patient Response to Interventions:  Receptive   Education Outcome:  Acknowledges education   Clinical Observations/Individualized Feedback: Amber Cruz was active in their participation of session activities and group discussion. Pt interacted well with LRT and peers duration of session.    Plan: Continue to engage patient in RT group sessions 2-3x/week.   Rosina Lowenstein, LRT, CTRS 12/11/2023 1:58 PM

## 2023-12-11 NOTE — B[facility] Counselor (Signed)
 CSW contacted pt's daughter Amber Cruz, to complete SPE and get collateral.   Pt's daughter reports that pt's memory issues have been slowly coming on for the last few years.   Pt's daughter reports that pt's memory has been worse since the overdose.   Pt's daughter states that the overdose was intentional, stating "She [the patient] has voiced to [name] many times that this was intentional. She states this is not how it was supposed to end. "   Amber Cruz reports she feels her mom was attempting to commit suicide and that her mom minimizes it.   [name] reports at one point the family was concerned about possibly memory issues and wanted to see a neurologist before the overdose, but were never successful in getting seen by a neurologist.   Amber Cruz reports for most of pt's life she has dealt with depression and anxiety, stating "She [the patient] has spoken to her husband about how miserable she is, her husband has taken all weapons from outside of the home because of this"  Amber Cruz reports that she feels a lot of factors played into pt attempting to commit suicide, reporting that pt had been sick from pneumonia for a few weeks, that pt recently retired, and that pt had moved back into their old home as well.   Amber Cruz reports pt uses a pill box to organize her medicine and that pt went and got the actual bottles and took all the pills in the bottle.   Amber Cruz reports when pt was in the ICU, pt shared with chaplain at [facility] that she [pt] felt she didn't have a purpose anymore.  Amber Cruz reports that some of the pills pt took were pt's husbands pills and pt's dog's pills.  Amber Cruz reports some of the pills were never identified by the pharmacy at [facility].   Amber Cruz reports she feels the overdose was intentional because the family went back in search history on the pt's phone and the pt was searching to see how long an overdose takes and also searched are there tests for Dementia and Alzheimer's.   Amber Cruz reports that pt was  noticing changes in her memory and would often ask her [[name]] why she [pt] could not remember things or why they were so fuzzy.   Amber Cruz feels that what provoked the overdose on Friday specifically was that pt hates to be alone, recently moved back to old house for a week or two, retired because she had been out sick.   Amber Cruz expresses concerns for her mom and her wellbeing.   CSW provided understanding and support. CSW informed provider of conversation   Reynaldo Minium, MSW, [facility] 12/11/2023 4:19 PM

## 2023-12-11 NOTE — Progress Notes (Signed)
 [facility] MD Progress Note  12/11/2023 12:53 PM Amber Cruz  MRN:  [id number] Amber Cruz is a 68 y.o. female admitted: Medicallyfor 12/01/2023  2:45 PM with loss of consciousness after overdose. 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 Psychiatry consulted for suicide attempt. Per chart,Patient was in ICU and transferred to medical floor for monitoring. Once patient was medically cleared she was transfer to [facility] psych unit for stabilization.   Subjective:  Chart reviewed, case discussed in multidisciplinary meeting, patient seen during rounds, patient seen in treatment team meeting in the presence of RN and Child psychotherapist.  Today during assessment patient is oriented to person, place, patient reports she is in [place], patient is oriented to her date of birth, she is oriented to month, and year.  Patient also reports that she was informed that she overdosed on medicine resulting in this hospitalization.  Patient did mention that she does remember taking medicine more than prescribed but not at one time.  Patient said that she "accidentally" took more than prescribed medicine.  Patient denies this being a suicide attempt.  Today patient denies thoughts of harming herself, no intention or plan.  Patient was encouraged to attend groups and work on coping strategies.  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 She denies auditory/visual hallucinations.    Sleep: Fair  Appetite:  Fair  Past Psychiatric History: see h&P Family History:  Family History  Problem Relation Age of Onset   Alzheimer's disease Mother    Hypertension Father    Heart failure Father    Social History:  Social History   Substance and Sexual Activity  Alcohol Use No     Social History   Substance and Sexual Activity  Drug Use No    Social History    Socioeconomic History   Marital status: Married    Spouse name: Not on file   Number of children: Not on file   Years of education: Not on file   Highest education level: Not on file  Occupational History   Not on file  Tobacco Use   Smoking status: Never   Smokeless tobacco: Never  Vaping Use   Vaping status: Never Used  Substance and Sexual Activity   Alcohol use: No   Drug use: No   Sexual activity: Not Currently    Birth control/protection: None  Other Topics Concern   Not on file  Social History Narrative   Not on file   Social Drivers of Health   Financial Resource Strain: Low Risk  (04/21/2022)   Received from [facility] to [facility]   Overall Financial Resource Strain (CARDIA)    Difficulty of Paying Living Expenses: Not hard at all  Food Insecurity: Patient Unable To Answer (12/08/2023)   Hunger Vital Sign    Worried About Running Out of Food in the Last Year: Patient unable to answer    Ran Out of Food in the Last Year: Patient unable to answer  Transportation Needs: Patient Unable To Answer (12/08/2023)   PRAPARE - Transportation    Lack of Transportation (Medical): Patient unable to answer    Lack of Transportation (Non-Medical): Patient unable to answer  Physical Activity: Insufficiently Active (04/21/2022)   Received from [facility] to [facility]   Exercise Vital  Sign    Days of Exercise per Week: 4 days    Minutes of Exercise per Session: 20 min  Stress: No Stress Concern Present (04/21/2022)   Received from [facility] prior to [facility]26/2022[facility]   [employer] of Occupational Health - Occupational Stress Questionnaire    Feeling of Stress : Only a little  Social Connections: Unknown (12/08/2023)   Social Connection and Isolation Panel [NHANES]    Frequency of Communication with Friends and Family: Patient unable to  answer    Frequency of Social Gatherings with Friends and Family: Patient unable to answer    Attends Religious Services: Patient unable to answer    Active Member of Clubs or Organizations: Patient declined    Attends Banker Meetings: Patient unable to answer    Marital Status: Married   Past Medical History:  Past Medical History:  Diagnosis Date   Aneurysm (HCC)    Anxiety    Frequent infections of left ear    Thyroid disease     Past Surgical History:  Procedure Laterality Date   DESCENDING AORTIC ANEURYSM REPAIR W/ STENT     MYRINGOTOMY Left 2018    Current Medications: Current Facility-Administered Medications  Medication Dose Route Frequency Provider Last Rate Last Admin   acetaminophen (TYLENOL) tablet 650 mg  650 mg Oral Q6H PRN Maryagnes Amos, FNP   650 mg at 12/09/23 2002   alum & mag hydroxide-simeth (MAALOX/MYLANTA) 200-200-20 MG/5ML suspension 30 mL  30 mL Oral Q4H PRN Starkes-Perry, Juel Burrow, FNP       clonazePAM (KLONOPIN) tablet 1 mg  1 mg Oral BID Maryagnes Amos, FNP   1 mg at 12/11/23 0918   levothyroxine (SYNTHROID) tablet 137 mcg  137 mcg Oral Q0600 Maryagnes Amos, FNP   137 mcg at 12/11/23 0651   LORazepam (ATIVAN) injection 0.5 mg  0.5 mg Intravenous Q6H PRN Maryagnes Amos, FNP       LORazepam (ATIVAN) tablet 0.5 mg  0.5 mg Oral Q6H PRN Verner Chol, MD   0.5 mg at 12/10/23 1545   magnesium hydroxide (MILK OF MAGNESIA) suspension 30 mL  30 mL Oral Daily PRN Starkes-Perry, Juel Burrow, FNP       OLANZapine (ZYPREXA) injection 2.5 mg  2.5 mg Intramuscular BID PRN Starkes-Perry, Juel Burrow, FNP       OLANZapine zydis (ZYPREXA) disintegrating tablet 5 mg  5 mg Oral TID PRN Maryagnes Amos, FNP   5 mg at 12/08/23 1729   sertraline (ZOLOFT) tablet 25 mg  25 mg Oral Daily Maryagnes Amos, FNP   25 mg at 12/11/23 6295    Lab Results:  No results found for this or any previous visit (from the past 48  hours).   Blood Alcohol level:  Lab Results  Component Value Date   ETH <10 12/01/2023    Metabolic Disorder Labs:  No results found for: "PROLACTIN" Lab Results  Component Value Date   TRIG 33 12/02/2023     Psychiatric Specialty Exam:   Presentation  General Appearance:  Appropriate for Environment; Casual   Eye Contact: Fair   Speech: Clear and Coherent   Speech Volume: Normal       Mood and Affect  Mood: "Fine"   Affect: Pleasant     Thought Process  Thought Processes: Coherent   Descriptions of Associations:Intact   Orientation:Full (Time, Place and Person)   Thought Content: Improving   Hallucinations:Hallucinations: None  Ideas of Reference:None   Suicidal Thoughts:Suicidal Thoughts: No   Homicidal Thoughts:Homicidal Thoughts: No     Sensorium  Memory: Poor   Judgment: Limited   Insight: Shallow     Executive Functions  Concentration: Fair   Attention Span: Fair   Recall: Poor     Language: Fair     Psychomotor Activity  Psychomotor Activity: Psychomotor Activity: Normal   Musculoskeletal: Strength & Muscle Tone: within normal limits Gait & Station: normal Assets  Assets: Manufacturing systems engineer; Desire for Improvement; Resilience       Physical Exam: Physical Exam Vitals and nursing note reviewed.  HENT:     Head: Normocephalic.     Mouth/Throat:     Mouth: Mucous membranes are moist.  Eyes:     Pupils: Pupils are equal, round, and reactive to light.  Cardiovascular:     Rate and Rhythm: Normal rate.     Pulses: Normal pulses.   Neurological:     General: No focal deficit present.     Mental Status: She is alert.      Review of Systems  Constitutional: Negative.   HENT: Negative.    Eyes: Negative.   Respiratory: Negative.    Cardiovascular: Negative.   Gastrointestinal: Negative.   Skin: Negative.   Neurological: Negative.    Blood pressure 130/73, pulse 73, temperature 98.1 F (36.7  C), resp. rate 18, weight 102.5 kg, SpO2 94%. Body mass index is 36.48 kg/m.  Diagnosis: Principal Problem:   MDD (major depressive disorder), recurrent episode, severe (HCC)   Safety and Monitoring:             -- Patient admitted on voluntary admission to inpatient psychiatric unit for safety, stabilization and treatment IVC will be initiated on 12/10/2023 until patient and recovers to a point of oriented x 4 and able to understand her treatment plan.             -- Daily contact with patient to assess and evaluate symptoms and progress in treatment             -- Patient's case to be discussed in multi-disciplinary team meeting             -- Observation Level: q15 minute checks             -- Vital signs:  q12 hours             -- Precautions: suicide, elopement, and assault   2. Psychiatric Diagnoses and Treatment:            Continue Klonopin, Synthroid, and Zoloft                 -- Encouraged patient to participate in unit milieu and in scheduled group therapies                            3. Medical Issues Being Addressed:  No urgent medical need is noted   4. Discharge Planning:              -- Social work and case management to assist with discharge planning and identification of hospital follow-up needs prior to discharge             -- Estimated LOS: 1-2 days days             -- Discharge Concerns: Need to establish a safety plan; Medication compliance and effectiveness             --  Discharge Goals: Return home with outpatient referrals follow ups    Lewanda Rife, MD 12/11/2023, 12:53 PM

## 2023-12-11 NOTE — BH IP Treatment Plan (Signed)
 Interdisciplinary Treatment and Diagnostic Plan Update  12/11/2023 Time of Session: 10:00 AM  Amber Cruz MRN: [id number]  Principal Diagnosis: MDD (major depressive disorder), recurrent episode, severe (HCC)  Secondary Diagnoses: Principal Problem:   MDD (major depressive disorder), recurrent episode, severe (HCC)   Current Medications:  Current Facility-Administered Medications  Medication Dose Route Frequency Provider Last Rate Last Admin   acetaminophen (TYLENOL) tablet 650 mg  650 mg Oral Q6H PRN Maryagnes Amos, FNP   650 mg at 12/09/23 2002   alum & mag hydroxide-simeth (MAALOX/MYLANTA) 200-200-20 MG/5ML suspension 30 mL  30 mL Oral Q4H PRN Starkes-Perry, Juel Burrow, FNP       clonazePAM (KLONOPIN) tablet 1 mg  1 mg Oral BID Maryagnes Amos, FNP   1 mg at 12/11/23 8657   levothyroxine (SYNTHROID) tablet 137 mcg  137 mcg Oral Q0600 Maryagnes Amos, FNP   137 mcg at 12/11/23 0651   LORazepam (ATIVAN) injection 0.5 mg  0.5 mg Intravenous Q6H PRN Maryagnes Amos, FNP       LORazepam (ATIVAN) tablet 0.5 mg  0.5 mg Oral Q6H PRN Verner Chol, MD   0.5 mg at 12/10/23 1545   magnesium hydroxide (MILK OF MAGNESIA) suspension 30 mL  30 mL Oral Daily PRN Starkes-Perry, Juel Burrow, FNP       OLANZapine (ZYPREXA) injection 2.5 mg  2.5 mg Intramuscular BID PRN Starkes-Perry, Juel Burrow, FNP       OLANZapine zydis (ZYPREXA) disintegrating tablet 5 mg  5 mg Oral TID PRN Maryagnes Amos, FNP   5 mg at 12/08/23 1729   sertraline (ZOLOFT) tablet 25 mg  25 mg Oral Daily Maryagnes Amos, FNP   25 mg at 12/11/23 0919   PTA Medications: Medications Prior to Admission  Medication Sig Dispense Refill Last Dose/Taking   clonazePAM (KLONOPIN) 1 MG tablet Take 1 tablet (1 mg total) by mouth 2 (two) times daily.      levothyroxine (SYNTHROID, LEVOTHROID) 137 MCG tablet Take 137 mcg by mouth daily before breakfast.      meclizine (ANTIVERT) 25 MG tablet Take 1 tablet (25  mg total) by mouth 3 (three) times daily as needed for dizziness. 20 tablet 0    [Paused] metFORMIN (GLUCOPHAGE-XR) 500 MG 24 hr tablet Take 500 mg by mouth every evening. Take with evening meal      Multiple Vitamin (MULTI-VITAMIN) tablet Take 1 tablet by mouth daily.      OLANZapine (ZYPREXA) injection Inject 0.5 mLs (2.5 mg total) into the muscle 2 (two) times daily as needed for agitation.      ondansetron (ZOFRAN-ODT) 4 MG disintegrating tablet Take 1 tablet (4 mg total) by mouth every 8 (eight) hours as needed for nausea or vomiting. Dissolve under tongue. 12 tablet 0    [Paused] propranolol (INDERAL) 10 MG tablet Take 10 mg by mouth 2 (two) times daily as needed.      [Paused] rosuvastatin (CRESTOR) 5 MG tablet Take 5 mg by mouth daily.      sertraline (ZOLOFT) 50 MG tablet Take 25-50 mg by mouth See admin instructions. Take 25mg  (1/2 tablet) by mouth once daily for one week, THEN increase to 50mg  (1 tablet) once daily thereafter       Patient Stressors: Health problems   Marital or family conflict    Patient Strengths: Supportive family/friends   Treatment Modalities: Medication Management, Group therapy, Case management,  1 to 1 session with clinician, Psychoeducation, Recreational therapy.   Physician Treatment Plan for  Primary Diagnosis: MDD (major depressive disorder), recurrent episode, severe (HCC) Long Term Goal(s): Improvement in symptoms so as ready for discharge   Short Term Goals: Ability to identify changes in lifestyle to reduce recurrence of condition will improve Ability to verbalize feelings will improve Ability to disclose and discuss suicidal ideas Ability to demonstrate self-control will improve Ability to identify and develop effective coping behaviors will improve  Medication Management: Evaluate patient's response, side effects, and tolerance of medication regimen.  Therapeutic Interventions: 1 to 1 sessions, Unit Group sessions and Medication  administration.  Evaluation of Outcomes: Progressing  Physician Treatment Plan for Secondary Diagnosis: Principal Problem:   MDD (major depressive disorder), recurrent episode, severe (HCC)  Long Term Goal(s): Improvement in symptoms so as ready for discharge   Short Term Goals: Ability to identify changes in lifestyle to reduce recurrence of condition will improve Ability to verbalize feelings will improve Ability to disclose and discuss suicidal ideas Ability to demonstrate self-control will improve Ability to identify and develop effective coping behaviors will improve     Medication Management: Evaluate patient's response, side effects, and tolerance of medication regimen.  Therapeutic Interventions: 1 to 1 sessions, Unit Group sessions and Medication administration.  Evaluation of Outcomes: Progressing   RN Treatment Plan for Primary Diagnosis: MDD (major depressive disorder), recurrent episode, severe (HCC) Long Term Goal(s): Knowledge of disease and therapeutic regimen to maintain health will improve  Short Term Goals: Ability to remain free from injury will improve, Ability to verbalize frustration and anger appropriately will improve, Ability to demonstrate self-control, Ability to participate in decision making will improve, Ability to verbalize feelings will improve, Ability to disclose and discuss suicidal ideas, Ability to identify and develop effective coping behaviors will improve, and Compliance with prescribed medications will improve  Medication Management: RN will administer medications as ordered by provider, will assess and evaluate patient's response and provide education to patient for prescribed medication. RN will report any adverse and/or side effects to prescribing provider.  Therapeutic Interventions: 1 on 1 counseling sessions, Psychoeducation, Medication administration, Evaluate responses to treatment, Monitor vital signs and CBGs as ordered, Perform/monitor  CIWA, COWS, AIMS and Fall Risk screenings as ordered, Perform wound care treatments as ordered.  Evaluation of Outcomes: Progressing   LCSW Treatment Plan for Primary Diagnosis: MDD (major depressive disorder), recurrent episode, severe (HCC) Long Term Goal(s): Safe transition to appropriate next level of care at discharge, Engage patient in therapeutic group addressing interpersonal concerns.  Short Term Goals: Engage patient in aftercare planning with referrals and resources, Increase social support, Increase ability to appropriately verbalize feelings, Increase emotional regulation, Facilitate acceptance of mental health diagnosis and concerns, Facilitate patient progression through stages of change regarding substance use diagnoses and concerns, Identify triggers associated with mental health/substance abuse issues, and Increase skills for wellness and recovery  Therapeutic Interventions: Assess for all discharge needs, 1 to 1 time with Social worker, Explore available resources and support systems, Assess for adequacy in community support network, Educate family and significant other(s) on suicide prevention, Complete Psychosocial Assessment, Interpersonal group therapy.  Evaluation of Outcomes: Progressing   Progress in Treatment: Attending groups: Yes. Participating in groups: Yes. Taking medication as prescribed: Yes. Toleration medication: Yes. Family/Significant other contact made: No, will contact:  CSW will contact if given permission  Patient understands diagnosis: Yes. Discussing patient identified problems/goals with staff: Yes. Medical problems stabilized or resolved: Yes. Denies suicidal/homicidal ideation: Yes. Issues/concerns per patient self-inventory: No. Other: None   New problem(s) identified: No, Describe:  None identified   New Short Term/Long Term Goal(s): elimination of symptoms of psychosis, medication management for mood stabilization; elimination of SI  thoughts; development of comprehensive mental wellness plan.   Patient Goals:  " I would if I knew exactly why I'm here, my goal is to get back to life and take care of any emotional,mental, concerns"   Discharge Plan or Barriers: CSW will assist with appropriate discharge planing   Reason for Continuation of Hospitalization: Medication stabilization  Estimated Length of Stay: 1 to 7 days   Last 3 [place] Suicide Severity Risk Score: Flowsheet Row Admission (Current) from 12/08/2023 in [facility] ED from 11/17/2023 in [facility] Emergency Department at [facility] ED from 10/20/2023 in [facility]gent Care at [facility] RISK CATEGORY No Risk No Risk No Risk       Last PHQ 2/9 Scores:     No data to display          Scribe for Treatment Team: Elza Rafter, Theresia Majors 12/11/2023 10:27 AM

## 2023-12-11 NOTE — Progress Notes (Signed)
   12/11/23 2300  Psych Admission Type (Psych Patients Only)  Admission Status Voluntary  Psychosocial Assessment  Patient Complaints Anxiety  Eye Contact Fair  Facial Expression Anxious  Affect Anxious  Speech Logical/coherent  Interaction Assertive  Motor Activity Slow  Appearance/Hygiene Unremarkable  Behavior Characteristics Cooperative;Anxious  Mood Pleasant;Anxious  Thought Process  Coherency Circumstantial  Content Blaming others  Delusions None reported or observed  Perception WDL  Hallucination None reported or observed  Judgment Impaired  Confusion Moderate  Danger to Self  Current suicidal ideation? Denies  Danger to Others  Danger to Others None reported or observed

## 2023-12-11 NOTE — Plan of Care (Signed)

## 2023-12-11 NOTE — Group Note (Signed)
 Recreation Therapy Group Note   Group Topic:Health and Wellness  Group Date: 12/11/2023 Start Time: 1520 End Time: 1620 Facilitators: Rosina Lowenstein, LRT, CTRS Location: Courtyard  Group Description: Outdoor Recreation. Patients had the option to play corn hole, ring toss, bowling or listening to music while outside in the courtyard getting fresh air and sunlight. LRT and patients discussed things that they enjoy doing in their free time outside of the hospital. LRT encouraged patients to drink water after being active and getting their heart rate up.   Goal Area(s) Addressed: Patient will identify leisure interests.  Patient will practice healthy decision making. Patient will engage in recreation activity.   Affect/Mood: Appropriate   Participation Level: Active and Engaged   Participation Quality: Independent   Behavior: Calm and Cooperative   Speech/Thought Process: Coherent   Insight: Good   Judgement: Good   Modes of Intervention: Activity, Exploration, and Music   Patient Response to Interventions:  Attentive, Engaged, and Receptive   Education Outcome:  Acknowledges education   Clinical Observations/Individualized Feedback: Amber Cruz was active in their participation of session activities and group discussion. Pt identified "beach music and shagging" as her favorite type of music that she likes to listen and dance to. Pt was observed dancing with a nursing student. Pt interacted well with LRT and peer duration of session.    Plan: Continue to engage patient in RT group sessions 2-3x/week.   Rosina Lowenstein, LRT, CTRS 12/11/2023 5:15 PM

## 2023-12-11 NOTE — Progress Notes (Signed)
   12/11/23 0400  Psych Admission Type (Psych Patients Only)  Admission Status Voluntary  Psychosocial Assessment  Patient Complaints Anxiety  Eye Contact Fair  Facial Expression Anxious  Affect Anxious  Speech Logical/coherent  Interaction Assertive  Motor Activity Fidgety  Appearance/Hygiene Unremarkable  Behavior Characteristics Cooperative;Anxious  Mood Pleasant  Thought Process  Coherency Tangential  Content Blaming others  Delusions None reported or observed  Perception WDL  Hallucination None reported or observed  Judgment Impaired  Confusion Severe  Danger to Self  Current suicidal ideation? Denies  Danger to Others  Danger to Others None reported or observed

## 2023-12-12 DIAGNOSIS — F332 Major depressive disorder, recurrent severe without psychotic features: Secondary | ICD-10-CM | POA: Diagnosis not present

## 2023-12-12 NOTE — Group Note (Signed)
 Date:  12/12/2023 Time:  9:40 PM  Group Topic/Focus:  Wrap-Up Group:   The focus of this group is to help patients review their daily goal of treatment and discuss progress on daily workbooks.    Participation Level:  Active  Participation Quality:  Appropriate  Affect:  Appropriate  Cognitive:  Appropriate  Insight: Appropriate  Engagement in Group:  Engaged  Modes of Intervention:  Discussion  Additional Comments:    Maeola Harman 12/12/2023, 9:40 PM

## 2023-12-12 NOTE — Progress Notes (Signed)
   12/12/23 0630  15 Minute Checks  Location Bedroom  Visual Appearance Calm  Behavior Sleeping  Sleep (Behavioral Health Patients Only)  Calculate sleep? (Click Yes once per 24 hr at 0600 safety check) Yes  Documented sleep last 24 hours 9

## 2023-12-12 NOTE — Progress Notes (Signed)
   12/12/23 2300  Psych Admission Type (Psych Patients Only)  Admission Status Voluntary  Psychosocial Assessment  Patient Complaints Anxiety;Confusion;Crying spells;Depression;Disorientation  Eye Contact Fair  Facial Expression Anxious;Sad;Worried  Affect Anxious;Depressed  Speech Rapid  Interaction Assertive;Needy  Motor Activity Slow  Appearance/Hygiene Unremarkable  Behavior Characteristics Anxious;Cooperative  Mood Anxious;Depressed  Thought Process  Coherency Circumstantial  Content Preoccupation;Blaming others  Delusions None reported or observed  Hallucination None reported or observed  Judgment Impaired  Confusion Moderate  Danger to Self  Current suicidal ideation? Denies  Danger to Others  Danger to Others None reported or observed

## 2023-12-12 NOTE — Progress Notes (Addendum)
   12/12/23 1200  Psych Admission Type (Psych Patients Only)  Admission Status Voluntary  Psychosocial Assessment  Patient Complaints Anxiety  Eye Contact Fair  Facial Expression Anxious  Affect Anxious  Speech Logical/coherent  Interaction Assertive  Motor Activity Slow  Appearance/Hygiene Unremarkable  Behavior Characteristics Cooperative  Mood Pleasant  Thought Process  Coherency Circumstantial  Content Blaming others  Delusions None reported or observed  Perception WDL  Hallucination None reported or observed  Judgment Impaired  Confusion Moderate  Danger to Self  Current suicidal ideation? Denies  Danger to Others  Danger to Others None reported or observed   Patient's daughter called to speak with provider after hours but was told a message will be left for the provider to call her tomorrow. She also requested for a call from the Child psychotherapist. Her daughter stated that the patient called her stating that she has been confused today and have not been able to locate her room. This Clinical research associate informed patient's daughter that patient has been in and out of her room today with no directions to her room. The provider also spoke with her about her care today as well.  A message has been sent to the provider and social worker to call patient's daughter.

## 2023-12-12 NOTE — Group Note (Signed)
 Recreation Therapy Group Note   Group Topic:Other  Group Date: 12/12/2023 Start Time: 1500 End Time: 1605 Facilitators: Rosina Lowenstein, LRT, CTRS Location: Courtyard  Group Description: Music Reminisce. LRT encouraged patients to think of their favorite song(s) that reminded them of a positive memory or time in their life. LRT encouraged patient to talk about that memory aloud to the group. LRT played the song through a speaker for all to hear. LRT and patients discussed how thinking of a positive memory or time in their life can be used as a coping skill in everyday life post discharge.   Goal Area(s) Addressed: Patient will increase verbal communication by conversing with peers. Patient will contribute to group discussion with minimal prompting. Patient will reminisce a positive memory or moment in their life.    Affect/Mood: Appropriate   Participation Level: Active and Engaged   Participation Quality: Independent   Behavior: Appropriate, Calm, and Cooperative   Speech/Thought Process: Coherent   Insight: Good   Judgement: Good   Modes of Intervention: Activity, Music, and Open Conversation   Patient Response to Interventions:  Attentive, Engaged, and Receptive   Education Outcome:  Acknowledges education   Clinical Observations/Individualized Feedback: Amber Cruz was active in their participation of session activities and group discussion. Pt identified "beach music" as her favorite type to listen to. Pt talked with peers while present outside. Pt interacted well with LRT and peers duration of session.    Plan: Continue to engage patient in RT group sessions 2-3x/week.   [address] LRT, CTRS 12/12/2023 5:02 PM

## 2023-12-12 NOTE — Progress Notes (Signed)
 This RN sees the patient standing on the phone and crying uncontrollably. RN trying to console patient, provides supportive listening and comfort. Patient continues to cry. Patient states, "I am so confused on when I can go home and who is the they that is supposed to release me. I am so confused and I just want to go home." Patient medicated at this time with scheduled medication.   2215:  Patient came back up to the nursing station, crying uncontrollably again. Patient states that she is "so confused and do not know what to do. I tried to talk to my husband and he doesn't know when I am leaving and I don't know who can let me out of here." Patient talking fast paced and breathing fast. PRN medication given to her at  this time.

## 2023-12-12 NOTE — Progress Notes (Signed)
 [facility] MD Progress Note  12/12/2023 9:04 PM Amber Cruz  MRN:  [id number] Amber Cruz is a 68 y.o. female admitted: Medicallyfor 12/01/2023  2:45 PM with loss of consciousness after overdose. 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 Psychiatry consulted for suicide attempt. Per chart,Patient was in ICU and transferred to medical floor for monitoring. Once patient was medically cleared she was transfer to [facility] psych unit for stabilization.   Subjective:  Chart reviewed, case discussed in multidisciplinary meeting, patient seen during rounds.  Today during assessment patient is oriented to person, place, patient continues to report she is in [place], patient is oriented to her date of birth, she is oriented to month, and year.  Today patient denies thoughts of harming herself, no intention or plan.  Patient was encouraged to attend groups and work on coping strategies. She denies auditory/visual hallucinations. She is requesting to go home. We discussed discharge criteria. Patient denies side effects from meds.  Sleep: Fair  Appetite:  Fair  Past Psychiatric History: see h&P Family History:  Family History  Problem Relation Age of Onset   Alzheimer's disease Mother    Hypertension Father    Heart failure Father    Social History:  Social History   Substance and Sexual Activity  Alcohol Use No     Social History   Substance and Sexual Activity  Drug Use No    Social History   Socioeconomic History   Marital status: Married    Spouse name: Not on file   Number of children: Not on file   Years of education: Not on file   Highest education level: Not on file  Occupational History   Not on file  Tobacco Use   Smoking status: Never   Smokeless tobacco: Never  Vaping Use   Vaping status: Never Used  Substance and Sexual Activity   Alcohol use: No    Drug use: No   Sexual activity: Not Currently    Birth control/protection: None  Other Topics Concern   Not on file  Social History Narrative   Not on file   Social Drivers of Health   Financial Resource Strain: Low Risk  (04/21/2022)   Received from [facility] prior to [facility]26[facility]   Overall Financial Resource Strain (CARDIA)    Difficulty of Paying Living Expenses: Not hard at all  Food Insecurity: Patient Unable To Answer (12/08/2023)   Hunger Vital Sign    Worried About Running Out of Food in the Last Year: Patient unable to answer    Ran Out of Food in the Last Year: Patient unable to answer  Transportation Needs: Patient Unable To Answer (12/08/2023)   PRAPARE - Transportation    Lack of Transportation (Medical): Patient unable to answer    Lack of Transportation (Non-Medical): Patient unable to answer  Physical Activity: Insufficiently Active (04/21/2022)   Received from [facility] to [facility]26/2022[facility]   Exercise Vital Sign    Days of Exercise per Week: 4 days    Minutes of Exercise per Session: 20 min  Stress: No Stress Concern Present (04/21/2022)   Received from [facility] prior to [facility]26/2022., [facility]   [employer] of Occupational Health - Occupational Stress Questionnaire    Feeling of Stress : Only a little  Social Connections: Unknown (  12/08/2023)   Social Connection and Isolation Panel [NHANES]    Frequency of Communication with Friends and Family: Patient unable to answer    Frequency of Social Gatherings with Friends and Family: Patient unable to answer    Attends Religious Services: Patient unable to answer    Active Member of Clubs or Organizations: Patient declined    Attends Banker Meetings: Patient unable to answer    Marital Status: Married   Past Medical History:  Past Medical History:  Diagnosis Date   Aneurysm (HCC)     Anxiety    Frequent infections of left ear    Thyroid disease     Past Surgical History:  Procedure Laterality Date   DESCENDING AORTIC ANEURYSM REPAIR W/ STENT     MYRINGOTOMY Left 2018    Current Medications: Current Facility-Administered Medications  Medication Dose Route Frequency Provider Last Rate Last Admin   acetaminophen (TYLENOL) tablet 650 mg  650 mg Oral Q6H PRN Maryagnes Amos, FNP   650 mg at 12/09/23 2002   alum & mag hydroxide-simeth (MAALOX/MYLANTA) 200-200-20 MG/5ML suspension 30 mL  30 mL Oral Q4H PRN Starkes-Perry, Juel Burrow, FNP       clonazePAM (KLONOPIN) tablet 1 mg  1 mg Oral BID Maryagnes Amos, FNP   1 mg at 12/12/23 0859   levothyroxine (SYNTHROID) tablet 137 mcg  137 mcg Oral Q0600 Maryagnes Amos, FNP   137 mcg at 12/12/23 0900   LORazepam (ATIVAN) injection 0.5 mg  0.5 mg Intravenous Q6H PRN Maryagnes Amos, FNP       LORazepam (ATIVAN) tablet 0.5 mg  0.5 mg Oral Q6H PRN Verner Chol, MD   0.5 mg at 12/11/23 1423   magnesium hydroxide (MILK OF MAGNESIA) suspension 30 mL  30 mL Oral Daily PRN Starkes-Perry, Juel Burrow, FNP       OLANZapine (ZYPREXA) injection 2.5 mg  2.5 mg Intramuscular BID PRN Starkes-Perry, Juel Burrow, FNP       OLANZapine zydis (ZYPREXA) disintegrating tablet 5 mg  5 mg Oral TID PRN Maryagnes Amos, FNP   5 mg at 12/08/23 1729   sertraline (ZOLOFT) tablet 25 mg  25 mg Oral Daily Maryagnes Amos, FNP   25 mg at 12/12/23 1610    Lab Results:  No results found for this or any previous visit (from the past 48 hours).   Blood Alcohol level:  Lab Results  Component Value Date   ETH <10 12/01/2023    Metabolic Disorder Labs:  No results found for: "PROLACTIN" Lab Results  Component Value Date   TRIG 33 12/02/2023     Psychiatric Specialty Exam:   Presentation  General Appearance:  Appropriate for Environment; Casual   Eye Contact: Fair   Speech: Clear and Coherent   Speech  Volume: Normal       Mood and Affect  Mood: "Fine"   Affect: Pleasant     Thought Process  Thought Processes: Coherent   Descriptions of Associations:Intact   Orientation:Full (Time, Place and Person)   Thought Content: Improving   Hallucinations:Hallucinations: None   Ideas of Reference:None   Suicidal Thoughts:Suicidal Thoughts: No   Homicidal Thoughts:Homicidal Thoughts: No     Sensorium  Memory: Poor   Judgment: Improving   Insight: Shallow     Executive Functions  Concentration: Fair   Attention Span: Fair   Recall: Poor     Language: Fair     Psychomotor Activity  Psychomotor Activity: Psychomotor Activity: Normal  Musculoskeletal: Strength & Muscle Tone: within normal limits Gait & Station: normal Assets  Assets: Manufacturing systems engineer; Desire for Improvement; Resilience       Physical Exam: Physical Exam Vitals and nursing note reviewed.  HENT:     Head: Normocephalic.     Mouth/Throat:     Mouth: Mucous membranes are moist.  Eyes:     Pupils: Pupils are equal, round, and reactive to light.  Cardiovascular:     Rate and Rhythm: Normal rate.     Pulses: Normal pulses.   Neurological:     General: No focal deficit present.     Mental Status: She is alert.      Review of Systems  Constitutional: Negative.   HENT: Negative.    Eyes: Negative.   Respiratory: Negative.    Cardiovascular: Negative.   Gastrointestinal: Negative.   Skin: Negative.   Neurological: Negative.    Blood pressure 120/71, pulse 75, temperature 97.8 F (36.6 C), resp. rate 14, weight 102.5 kg, SpO2 97%. Body mass index is 36.48 kg/m.  Diagnosis: Principal Problem:   MDD (major depressive disorder), recurrent episode, severe (HCC)   Safety and Monitoring:             -- Patient admitted on voluntary admission to inpatient psychiatric unit for safety, stabilization and treatment IVC will be initiated on 12/10/2023 until patient and recovers  to a point of oriented x 4 and able to understand her treatment plan.             -- Daily contact with patient to assess and evaluate symptoms and progress in treatment             -- Patient's case to be discussed in multi-disciplinary team meeting             -- Observation Level: q15 minute checks             -- Vital signs:  q12 hours             -- Precautions: suicide, elopement, and assault   2. Psychiatric Diagnoses and Treatment:            Continue Klonopin, Synthroid, and Zoloft                 -- Encouraged patient to participate in unit milieu and in scheduled group therapies                            3. Medical Issues Being Addressed:  No urgent medical need is noted   4. Discharge Planning:              -- Social work and case management to assist with discharge planning and identification of hospital follow-up needs prior to discharge             -- Estimated LOS: 1-2 days days             -- Discharge Concerns: Need to establish a safety plan; Medication compliance and effectiveness             -- Discharge Goals: Return home with outpatient referrals follow ups    Lewanda Rife, MD 12/12/2023, 9:04 PM

## 2023-12-12 NOTE — Group Note (Signed)
 Recreation Therapy Group Note   Group Topic:Coping Skills  Group Date: 12/12/2023 Start Time: 1100 End Time: 1140 Facilitators: Rosina Lowenstein, LRT, CTRS Location:  Dayroom  Group Description: Seated Exercise. LRT discussed the mental and physical benefits of exercise. LRT and group discussed how physical activity can be used as a coping skill. Pt's and LRT followed along to an exercise video on the TV screen that provided a visual representation and audio description of every exercise performed. Pt's encouraged to listen to their bodies and stop at any time if they experience feelings of discomfort or pain. Pts were encouraged to drink water and stay hydrated.   Goal Area(s) Addressed: Patient will learn benefits of physical activity. Patient will identify exercise as a coping skill.  Patient will follow multistep directions. Patient will try a new leisure interest.    Affect/Mood: Appropriate   Participation Level: Minimal    Clinical Observations/Individualized Feedback: Latavia was originally present in group, however, was pulled out by MD.   Plan: Continue to engage patient in RT group sessions 2-3x/week.   Rosina Lowenstein, LRT, CTRS 12/12/2023 1:20 PM

## 2023-12-12 NOTE — Group Note (Signed)
 Date:  12/12/2023 Time:  10:59 AM  Group Topic/Focus:  Orientation:   The focus of this group is to educate the patient on the purpose and policies of crisis stabilization and provide a format to answer questions about their admission.  The group details unit policies and expectations of patients while admitted.    Participation Level:  Active  Participation Quality:  Attentive  Affect:  Appropriate  Cognitive:  Appropriate  Insight: Appropriate  Engagement in Group:  Engaged  Modes of Intervention:  Socialization  Additional Comments:     Alexis Frock 12/12/2023, 10:59 AM

## 2023-12-13 DIAGNOSIS — F332 Major depressive disorder, recurrent severe without psychotic features: Secondary | ICD-10-CM | POA: Diagnosis not present

## 2023-12-13 LAB — VITAMIN B12: Vitamin B-12: 336 pg/mL (ref 180–914)

## 2023-12-13 LAB — FOLATE: Folate: 8.1 ng/mL (ref >5.9)

## 2023-12-13 LAB — TSH: TSH: 0.369 u[IU]/mL (ref 0.350–4.500)

## 2023-12-13 MED ORDER — HYDROCORTISONE 0.5 % EX CREA
TOPICAL_CREAM | Freq: Two times a day (BID) | CUTANEOUS | Status: DC
  Administered 2023-12-14: 1 via TOPICAL
  Filled 2023-12-13: qty 28.35

## 2023-12-13 NOTE — Progress Notes (Signed)
   12/13/23 1400  Psych Admission Type (Psych Patients Only)  Admission Status Voluntary  Psychosocial Assessment  Patient Complaints Confusion  Eye Contact Fair  Facial Expression Anxious  Affect Anxious  Speech Logical/coherent  Interaction Needy  Motor Activity Slow  Appearance/Hygiene Unremarkable  Behavior Characteristics Cooperative  Mood Anxious;Depressed  Thought Process  Coherency Circumstantial  Content Preoccupation  Delusions None reported or observed  Perception WDL  Hallucination None reported or observed  Judgment Impaired  Confusion Moderate  Danger to Self  Current suicidal ideation? Denies  Danger to Others  Danger to Others None reported or observed

## 2023-12-13 NOTE — Plan of Care (Signed)
  Problem: Education: Goal: Knowledge of [place] [facility] Education information/materials will improve Outcome: Not Progressing Goal: Verbalization of understanding the information provided will improve Outcome: Not Progressing

## 2023-12-13 NOTE — Consult Note (Signed)
 NEUROLOGY CONSULT NOTE   Date of service: December 13, 2023 Patient Name: Amber Cruz MRN:  [id number] DOB:  1956/08/06 Chief Complaint: "Memory difficulties" Requesting Provider: Lewanda Rife, MD  History of Present Illness  Amber Cruz is a 68 y.o. female with hx of memory problems that have been fairly progressive over the past few years, but noticeably worse since she was admitted with intentional overdose approximately 2 weeks ago.  I spoke with her husband who states that he first noticed problems a few years ago, the first documentation according to neurology notes is in September 2022.  She had difficulty remembering instructions from her employer, and over time that has gotten worse and she has had to retire from her job.  She has been asking questions over and over, and does not seem to remember the answers.  Speaking with nursing staff, she has asked them the same questions repeatedly as well.  She was evaluated by neurology and August 2023 at which point she had an Moca of 25.  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  Since admission for her overdose  Past History   Past Medical History:  Diagnosis Date   Aneurysm (HCC)    Anxiety    Frequent infections of left ear    Thyroid disease     Past Surgical History:  Procedure Laterality Date   DESCENDING AORTIC ANEURYSM REPAIR W/ STENT     MYRINGOTOMY Left 2018    Family History: Family History  Problem Relation Age of Onset   Alzheimer's disease Mother    Hypertension Father    Heart failure Father     Social History  reports that she has never smoked. She has never used smokeless tobacco. She reports that she does not drink alcohol and does not use drugs.  Allergies  Allergen Reactions   Iodinated Contrast Media Itching and Rash    CT scan done w/ IV dye on 06/08/12. Per pt / [facility] pt needs  to be pre-medicated if needs future CT scans w/IV contrast.  CT scan done w/ IV dye on 06/08/12. Per pt / [facility] pt needs to be pre-medicated if needs future CT scans w/IV contrast.     Ambien [Zolpidem] Other (See Comments)    Drove vehicle without knowledge of driving. From [place], [place]   Prochlorperazine Other (See Comments)   Honey Bee Venom Protein [Honey Bee Venom]    Iodides     Medications   Current Facility-Administered Medications:    acetaminophen (TYLENOL) tablet 650 mg, 650 mg, Oral, Q6H PRN, Starkes-Perry, Takia S, FNP, 650 mg at 12/13/23 1637   alum & mag hydroxide-simeth (MAALOX/MYLANTA) 200-200-20 MG/5ML suspension 30 mL, 30 mL, Oral, Q4H PRN, Starkes-Perry, Juel Burrow, FNP   clonazePAM (KLONOPIN) tablet 1 mg, 1 mg, Oral, BID, Starkes-Perry, Takia S, FNP, 1 mg at 12/13/23 1104   hydrocortisone cream 0.5 %, , Topical, BID, Lewanda Rife, MD, Given at 12/13/23 1638   levothyroxine (SYNTHROID) tablet 137 mcg, 137 mcg, Oral, Q0600, Maryagnes Amos, FNP, 137 mcg at 12/13/23 0631   LORazepam (ATIVAN) injection 0.5 mg, 0.5 mg, Intravenous, Q6H PRN, Starkes-Perry, Juel Burrow, FNP   LORazepam (ATIVAN) tablet 0.5 mg, 0.5 mg, Oral, Q6H PRN, Verner Chol, MD, 0.5 mg at 12/11/23 1423   magnesium hydroxide (MILK OF MAGNESIA) suspension 30 mL, 30 mL, Oral, Daily PRN, Starkes-Perry, Juel Burrow, FNP   OLANZapine (ZYPREXA)  injection 2.5 mg, 2.5 mg, Intramuscular, BID PRN, Starkes-Perry, Juel Burrow, FNP   OLANZapine zydis (ZYPREXA) disintegrating tablet 5 mg, 5 mg, Oral, TID PRN, Maryagnes Amos, FNP, 5 mg at 12/12/23 2217   sertraline (ZOLOFT) tablet 25 mg, 25 mg, Oral, Daily, Maryagnes Amos, FNP, 25 mg at 12/13/23 1104  Vitals   Vitals:   12/11/23 0756 12/12/23 0730 12/12/23 1929 12/13/23 0718  BP: 130/73 (!) 107/49 120/71 (!) 117/51  Pulse: 73 60 75 67  Resp: 18 18 14 18   Temp: 98.1 F (36.7 C) 97.8 F (36.6 C) 97.8 F (36.6 C) 98.1 F (36.7 C)  TempSrc:       SpO2: 94% 95% 97% 96%  Weight:        Body mass index is 36.48 kg/m.  Physical Exam   Constitutional: Appears well-developed and well-nourished.  Neurologic Examination    Neuro: Mental Status: Patient is awake, alert, interactive and appropriate.  She is able to spell world backwards without difficulty, but does have some difficulty with serial sevens (3/5).  She is able to give the number of quarters in $2.75. She does not have any evidence of aphasia or neglect. Cranial Nerves: II: Visual Fields are full. Pupils are equal, round, and reactive to light.   III,IV, VI: EOMI without ptosis or diploplia.  V: Facial sensation is symmetric to temperature VII: Facial movement is mildly asymmetric, lower on the right than left, this appears baseline per her epic photo  Motor: Tone is normal. Bulk is normal. 5/5 strength was present in all four extremities.  Sensory: Sensation is symmetric to light touch and temperature in the arms and legs. Cerebellar: FNF and HKS are intact bilaterally        Labs/Imaging/Neurodiagnostic studies   CBC: No results for input(s): "WBC", "NEUTROABS", "HGB", "HCT", "MCV", "PLT" in the last 168 hours. Basic Metabolic Panel:  Lab Results  Component Value Date   NA 136 12/06/2023   K 3.7 12/06/2023   CO2 26 12/06/2023   GLUCOSE 129 (H) 12/06/2023   BUN 13 12/06/2023   CREATININE 0.66 12/06/2023   CALCIUM 8.8 (L) 12/06/2023   GFRNONAA >60 12/06/2023   GFRAA  01/28/2010    >60        The eGFR has been calculated using the MDRD equation. This calculation has not been validated in all clinical situations. eGFR's persistently <60 mL/min signify possible Chronic Kidney Disease.   Lipid Panel: No results found for: "LDLCALC" HgbA1c:  Lab Results  Component Value Date   HGBA1C (H) 01/27/2010    6.4 (NOTE)                                                                       According to the ADA Clinical Practice Recommendations for  2011, when HbA1c is used as a screening test:   >=6.5%   Diagnostic of Diabetes Mellitus           (if abnormal result  is confirmed)  5.7-6.4%   Increased risk of developing Diabetes Mellitus  References:Diagnosis and Classification of Diabetes Mellitus,Diabetes Care,2011,34(Suppl 1):S62-S69 and Standards of Medical Care in         Diabetes - 2011,Diabetes Care,2011,34  (Suppl 1):S11-S61.   Urine Drug Screen:  Component Value Date/Time   LABOPIA POSITIVE (A) 12/01/2023 1611   COCAINSCRNUR NONE DETECTED 12/01/2023 1611   LABBENZ POSITIVE (A) 12/01/2023 1611   AMPHETMU NONE DETECTED 12/01/2023 1611   THCU NONE DETECTED 12/01/2023 1611   LABBARB NONE DETECTED 12/01/2023 1611    Alcohol Level     Component Value Date/Time   ETH <10 12/01/2023 1513   INR  Lab Results  Component Value Date   INR 1.0 12/04/2023   APTT No results found for: "APTT" AED levels: No results found for: "PHENYTOIN", "ZONISAMIDE", "LAMOTRIGINE", "LEVETIRACETA"   ASSESSMENT   Amber Cruz is a 68 y.o. female with a history of at least 3 years of subjective memory complaints, worsening over the past year, and more noticeable over the 2-week admission.  My suspicion is that she did have a mild delirium associated with her admission which is gradually improving.  It sounds like she does have some neurocognitive disorder, though pseudodementia is also a possibility.  I think the next step in her evaluation would likely be neuropsychiatric testing, but this will need to be obtained as an outpatient.  [facility], and I encouraged her husband to call and make a repeat appointment following discharge from here.  B12 and TSH have been normal, but I will check an RPR  RECOMMENDATIONS  Check RPR Outpatient psychometric testing Neurology will be available on an as-needed basis, I will follow-up the RPR but if negative no further inpatient testing is  needed. ______________________________________________________________________    Stormy Card, MD [facility]

## 2023-12-13 NOTE — Group Note (Signed)
 Date:  12/13/2023 Time:  10:48 AM  Group Topic/Focus:  Goals Group:   The focus of this group is to help patients establish daily goals to achieve during treatment and discuss how the patient can incorporate goal setting into their daily lives to aide in recovery.    Participation Level:  Active  Participation Quality:  Appropriate  Affect:  Appropriate  Cognitive:  Appropriate  Insight: Appropriate  Engagement in Group:  Engaged  Modes of Intervention:  Discussion   Ardelle Anton 12/13/2023, 10:48 AM

## 2023-12-13 NOTE — Progress Notes (Signed)
   12/13/23 2000  Psych Admission Type (Psych Patients Only)  Admission Status Voluntary  Psychosocial Assessment  Patient Complaints Confusion  Eye Contact Fair  Facial Expression Anxious  Affect Anxious  Speech Logical/coherent  Interaction Needy  Motor Activity Slow  Appearance/Hygiene Unremarkable  Behavior Characteristics Cooperative  Mood Anxious  Thought Process  Coherency Circumstantial  Content Preoccupation  Delusions None reported or observed  Perception WDL  Hallucination None reported or observed  Judgment Impaired  Confusion Moderate  Danger to Self  Current suicidal ideation? Denies

## 2023-12-13 NOTE — Progress Notes (Signed)
   12/13/23 0617  15 Minute Checks  Location Bedroom  Visual Appearance Calm  Behavior Sleeping  Sleep (Behavioral Health Patients Only)  Calculate sleep? (Click Yes once per 24 hr at 0600 safety check) Yes  Documented sleep last 24 hours 9

## 2023-12-13 NOTE — Group Note (Signed)
 Date:  12/13/2023 Time:  9:59 PM  Group Topic/Focus:  Goals Group:   The focus of this group is to help patients establish daily goals to achieve during treatment and discuss how the patient can incorporate goal setting into their daily lives to aide in recovery.    Participation Level:  Active  Participation Quality:  Appropriate and Attentive  Affect:  Appropriate  Cognitive:  Alert and Appropriate  Insight: Appropriate and Good  Engagement in Group:  Engaged  Modes of Intervention:  Discussion, Rapport Building, and Support  Additional Comments:     Jaken Fregia 12/13/2023, 9:59 PM

## 2023-12-13 NOTE — Progress Notes (Signed)
 [facility] MD Progress Note  12/13/2023 3:10 PM Amber Cruz  MRN:  [id number] Amber Cruz is a 68 y.o. female admitted: Medicallyfor 12/01/2023  2:45 PM with loss of consciousness after overdose. 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 Psychiatry consulted for suicide attempt. Per chart,Patient was in ICU and transferred to medical floor for monitoring. Once patient was medically cleared she was transfer to [facility] psych unit for stabilization.   Subjective:  Chart reviewed, case discussed in multidisciplinary meeting, patient seen during rounds.  Today during assessment patient is oriented to person, place, patient continues to report she is in [place], patient is oriented to her date of birth, she is oriented to month, and year.  Today patient was able to follow-up with this provider to patient's room on the unit.  We discussed patient daughter is concerned about patient's memory.  Patient does not recall telling her daughter via phone yesterday that the patient has not talked to any provider.  The patient says she will clarify this misunderstanding with her daughter.  Patient reports her sleep and appetite has improved, patient denies any intention to harm herself or others.  Patient was encouraged to attend groups and work on coping strategies. She denies auditory/visual hallucinations. Patient denies side effects from meds.   I spoke with patient's daughter via phone today.  Daughter's concerns were addressed to her satisfaction.  Daughter is requesting neurology consult.  Neurology has been consulted. Per daughter's report patient's confusion has increased after the overdose.  Daughter was provided with support and reassurance, daughter was informed that patient has improved in the past few days.  Sleep: Fair  Appetite:  Fair  Past Psychiatric History: see h&P Family  History:  Family History  Problem Relation Age of Onset   Alzheimer's disease Mother    Hypertension Father    Heart failure Father    Social History:  Social History   Substance and Sexual Activity  Alcohol Use No     Social History   Substance and Sexual Activity  Drug Use No    Social History   Socioeconomic History   Marital status: Married    Spouse name: Not on file   Number of children: Not on file   Years of education: Not on file   Highest education level: Not on file  Occupational History   Not on file  Tobacco Use   Smoking status: Never   Smokeless tobacco: Never  Vaping Use   Vaping status: Never Used  Substance and Sexual Activity   Alcohol use: No   Drug use: No   Sexual activity: Not Currently    Birth control/protection: None  Other Topics Concern   Not on file  Social History Narrative   Not on file   Social Drivers of Health   Financial Resource Strain: Low Risk  (04/21/2022)   Received from [facility] prior to [facility]26/2022[facility]   Overall Financial Resource Strain (CARDIA)    Difficulty of Paying Living Expenses: Not hard at all  Food Insecurity: Patient Unable To Answer (12/08/2023)   Hunger Vital Sign    Worried About Running Out of Food in the Last Year: Patient unable to answer    Ran Out of Food in the Last Year: Patient unable to answer  Transportation Needs: Patient Unable To Answer (12/08/2023)   PRAPARE - Transportation  Lack of Transportation (Medical): Patient unable to answer    Lack of Transportation (Non-Medical): Patient unable to answer  Physical Activity: Insufficiently Active (04/21/2022)   Received from [facility] prior to 11[facility]26/2022., [facility]   Exercise Vital Sign    Days of Exercise per Week: 4 days    Minutes of Exercise per Session: 20 min  Stress: No Stress Concern Present (04/21/2022)   Received from [facility] visits  prior to 11/26/2022., [facility]   [employer] of Occupational Health - Occupational Stress Questionnaire    Feeling of Stress : Only a little  Social Connections: Unknown (12/08/2023)   Social Connection and Isolation Panel [NHANES]    Frequency of Communication with Friends and Family: Patient unable to answer    Frequency of Social Gatherings with Friends and Family: Patient unable to answer    Attends Religious Services: Patient unable to answer    Active Member of Clubs or Organizations: Patient declined    Attends Banker Meetings: Patient unable to answer    Marital Status: Married   Past Medical History:  Past Medical History:  Diagnosis Date   Aneurysm (HCC)    Anxiety    Frequent infections of left ear    Thyroid disease     Past Surgical History:  Procedure Laterality Date   DESCENDING AORTIC ANEURYSM REPAIR W/ STENT     MYRINGOTOMY Left 2018    Current Medications: Current Facility-Administered Medications  Medication Dose Route Frequency Provider Last Rate Last Admin   acetaminophen (TYLENOL) tablet 650 mg  650 mg Oral Q6H PRN Maryagnes Amos, FNP   650 mg at 12/09/23 2002   alum & mag hydroxide-simeth (MAALOX/MYLANTA) 200-200-20 MG/5ML suspension 30 mL  30 mL Oral Q4H PRN Starkes-Perry, Juel Burrow, FNP       clonazePAM (KLONOPIN) tablet 1 mg  1 mg Oral BID Maryagnes Amos, FNP   1 mg at 12/13/23 1104   hydrocortisone cream 0.5 %   Topical BID Lewanda Rife, MD       levothyroxine (SYNTHROID) tablet 137 mcg  137 mcg Oral Q0600 Maryagnes Amos, FNP   137 mcg at 12/13/23 0631   LORazepam (ATIVAN) injection 0.5 mg  0.5 mg Intravenous Q6H PRN Maryagnes Amos, FNP       LORazepam (ATIVAN) tablet 0.5 mg  0.5 mg Oral Q6H PRN Verner Chol, MD   0.5 mg at 12/11/23 1423   magnesium hydroxide (MILK OF MAGNESIA) suspension 30 mL  30 mL Oral Daily PRN Starkes-Perry, Juel Burrow, FNP       OLANZapine (ZYPREXA) injection 2.5 mg  2.5 mg  Intramuscular BID PRN Starkes-Perry, Juel Burrow, FNP       OLANZapine zydis (ZYPREXA) disintegrating tablet 5 mg  5 mg Oral TID PRN Maryagnes Amos, FNP   5 mg at 12/12/23 2217   sertraline (ZOLOFT) tablet 25 mg  25 mg Oral Daily Maryagnes Amos, FNP   25 mg at 12/13/23 1104    Lab Results:  No results found for this or any previous visit (from the past 48 hours).   Blood Alcohol level:  Lab Results  Component Value Date   ETH <10 12/01/2023    Metabolic Disorder Labs:  No results found for: "PROLACTIN" Lab Results  Component Value Date   TRIG 33 12/02/2023     Psychiatric Specialty Exam:   Presentation  General Appearance:  Appropriate for Environment; Casual   Eye  Contact: Fair   Speech: Clear and Coherent   Speech Volume: Normal       Mood and Affect  Mood: "Fine"   Affect: Pleasant     Thought Process  Thought Processes: Coherent   Descriptions of Associations:Intact   Orientation:Full (Time, Place and Person)   Thought Content: Improving   Hallucinations:Hallucinations: None   Ideas of Reference:None   Suicidal Thoughts:Suicidal Thoughts: No   Homicidal Thoughts:Homicidal Thoughts: No     Sensorium  Memory: Improving, patient is oriented   Judgment: Improving   Insight: Improving     Executive Functions  Concentration: Fair   Attention Span: Fair   Recall: Poor     Language: Fair     Psychomotor Activity  Psychomotor Activity: Psychomotor Activity: Normal   Musculoskeletal: Strength & Muscle Tone: within normal limits Gait & Station: normal Assets  Assets: Manufacturing systems engineer; Desire for Improvement; Resilience       Physical Exam: Physical Exam Vitals and nursing note reviewed.  HENT:     Head: Normocephalic.     Mouth/Throat:     Mouth: Mucous membranes are moist.  Eyes:     Pupils: Pupils are equal, round, and reactive to light.  Cardiovascular:     Rate and Rhythm: Normal rate.      Pulses: Normal pulses.   Neurological:     General: No focal deficit present.     Mental Status: She is alert.      Review of Systems  Constitutional: Negative.   HENT: Negative.    Eyes: Negative.   Respiratory: Negative.    Cardiovascular: Negative.   Gastrointestinal: Negative.   Skin: Negative.   Neurological: Negative.    Blood pressure (!) 117/51, pulse 67, temperature 98.1 F (36.7 C), resp. rate 18, weight 102.5 kg, SpO2 96%. Body mass index is 36.48 kg/m.  Diagnosis: Principal Problem:   MDD (major depressive disorder), recurrent episode, severe (HCC)   Safety and Monitoring:             -- Patient admitted on voluntary admission to inpatient psychiatric unit for safety, stabilization and treatment IVC will be initiated on 12/10/2023 until patient and recovers to a point of oriented x 4 and able to understand her treatment plan.             -- Daily contact with patient to assess and evaluate symptoms and progress in treatment             -- Patient's case to be discussed in multi-disciplinary team meeting             -- Observation Level: q15 minute checks             -- Vital signs:  q12 hours             -- Precautions: suicide, elopement, and assault   2. Psychiatric Diagnoses and Treatment:            Continue Klonopin, Synthroid, and Zoloft                 -- Encouraged patient to participate in unit milieu and in scheduled group therapies                            3. Medical Issues Being Addressed:  No urgent medical need is noted   4. Discharge Planning:              -- Social work  and case management to assist with discharge planning and identification of hospital follow-up needs prior to discharge             -- Estimated LOS: 1-2 days days             -- Discharge Concerns: Need to establish a safety plan; Medication compliance and effectiveness             -- Discharge Goals: Return home with outpatient referrals follow ups    Lewanda Rife,  MD

## 2023-12-13 NOTE — Group Note (Signed)
 Therapy Group Note  Group Topic:Other  Group Date: 12/13/2023 Start Time: 1300 End Time: 1330 Facilitators: Kimoni Pickerill, Thomes Dinning, PT     Group Description: Group discussed impact of balance on safety and independence with functional tasks.  Identified and discussed any self-perceived balance deficits to personalize information.  Discussed and reviewed strategies to address/improve balance deficits: use of assist devices, activity pacing/energy conservation, environment/home safety modifications, focusing attention/minimizing distraction.  Reviewed and participated with standing LE therex designed to target dynamic balance reactions and LE strength/stability; provided handouts with HEP to be utilized outside of group time as appropriate.  Allowed time for questions and further discussion on any balance or mobility concerns/needs.   Therapeutic Goal(s): Identify and discuss any individual balance deficits and functional implications. Identify and discuss any environmental/home safety modifications that can optimize balance and safety for mobility within the home. Demonstrate understanding and performance of standing therex designed to target dynamic balance deficits.   Individual Participation: Pt actively participated with the discussion and physical activity components of the session.  Pt was generally steady with standing therex/balance activities requiring only SBA for general safety.      Participation Level: Moderate   Participation Quality: Minimal Cues   Behavior: Alert and Appropriate   Speech/Thought Process: Coherent, Focused, and Organized   Affect/Mood: Appropriate   Insight: Moderate   Judgement: Good   Individualization: Per above  Modes of Intervention: Activity and Discussion  Patient Response to Interventions:  Attentive and Engaged   Plan: Continue to engage patient in OT groups 1 - 2x/week.  Ovidio Hanger PT, DPT 12/13/23, 3:19 PM

## 2023-12-14 DIAGNOSIS — F332 Major depressive disorder, recurrent severe without psychotic features: Secondary | ICD-10-CM | POA: Diagnosis not present

## 2023-12-14 LAB — RPR: RPR Ser Ql: NONREACTIVE

## 2023-12-14 MED ORDER — CLONAZEPAM 1 MG PO TABS: 1.0000 mg | ORAL_TABLET | Freq: Two times a day (BID) | ORAL | 0 refills | Status: DC

## 2023-12-14 MED ORDER — SERTRALINE HCL 25 MG PO TABS: 25.0000 mg | ORAL_TABLET | Freq: Every day | ORAL | 0 refills | Status: DC

## 2023-12-14 MED ORDER — LEVOTHYROXINE SODIUM 137 MCG PO TABS: 137.0000 ug | ORAL_TABLET | Freq: Every day | ORAL | 0 refills | Status: AC

## 2023-12-14 MED ORDER — HYDROCORTISONE 0.5 % EX CREA: TOPICAL_CREAM | Freq: Two times a day (BID) | CUTANEOUS | 0 refills | Status: DC

## 2023-12-14 NOTE — Plan of Care (Signed)
   Problem: Education: Goal: Emotional status will improve Outcome: Progressing Goal: Mental status will improve Outcome: Progressing

## 2023-12-14 NOTE — Group Note (Signed)
 Recreation Therapy Group Note   Group Topic:Communication  Group Date: 12/14/2023 Start Time: 1445 End Time: 1535 Facilitators: Rosina Lowenstein, LRT, CTRS Location: Courtyard  Group Description: Emotional Check in. Patient sat and talked with LRT about how they are doing and whatever else is on their mind. LRT provided active listening, reassurance and encouragement. Pts were given the opportunity to listen to music or color mandalas while they talk.    Goal Area(s) Addressed: Patient will engage in conversation with LRT. Patient will communicate their wants, needs, or questions.  Patient will practice a new coping skill of "talking to someone".   Affect/Mood: Appropriate   Participation Level: Moderate   Participation Quality: Independent   Behavior: Calm and Reserved   Speech/Thought Process: Coherent   Insight: Fair   Judgement: Fair    Modes of Intervention: Open Conversation, Rapport Building, Socialization, and Support   Patient Response to Interventions:  Engaged and Receptive   Education Outcome:  Acknowledges education   Clinical Observations/Individualized Feedback: Kaitlyne was active in their participation of session activities and group discussion. Pt shared that she was glad to get outside today and enjoys the outdoors. Pt interacted well with LRT and peers duration of session.    Plan: Continue to engage patient in RT group sessions 2-3x/week.   [address] LRT, CTRS 12/14/2023 5:10 PM

## 2023-12-14 NOTE — BHH Counselor (Signed)
 CSW contacted [facility] , [phone]  to get pt's provider switched per their request.   According to the practice, they have to message the office director as changing provider's is not allowed at that practice.   CSW awaits word from front office staff if pt is allowed to change provider's.   Reynaldo Minium, MSW, [place] 12/14/2023 2:42 PM

## 2023-12-14 NOTE — Progress Notes (Signed)
   12/14/23 1200  Psych Admission Type (Psych Patients Only)  Admission Status Voluntary  Psychosocial Assessment  Patient Complaints None  Eye Contact Fair  Facial Expression Anxious  Affect Anxious  Speech Logical/coherent  Interaction Needy  Motor Activity Slow  Appearance/Hygiene Unremarkable  Behavior Characteristics Cooperative  Mood Depressed  Thought Process  Coherency Circumstantial  Content Preoccupation  Delusions None reported or observed  Perception WDL  Hallucination None reported or observed  Judgment Impaired  Confusion Moderate  Danger to Self  Current suicidal ideation? Denies  Danger to Others  Danger to Others None reported or observed

## 2023-12-14 NOTE — Plan of Care (Signed)
   Problem: Education: Goal: Knowledge of [facility] Education information/materials will improve Outcome: Progressing Goal: Emotional status will improve Outcome: Progressing Goal: Mental status will improve Outcome: Progressing Goal: Verbalization of understanding the information provided will improve Outcome: Progressing

## 2023-12-14 NOTE — Group Note (Signed)
 Recreation Therapy Group Note   Group Topic:Stress Management  Group Date: 12/14/2023 Start Time: 1100 End Time: 1140 Facilitators: Rosina Lowenstein, LRT, CTRS Location:  [facility]  Group Description: Meditation. LRT and patients discussed what they know about meditation and mindfulness. LRT played a Deep Breathing Meditation exercise script for patients to follow along to. LRT and patients discussed how meditation and deep breathing can be used as a coping skill post--discharge to help manage symptoms of stress.   Goal Area(s) Addressed: Patient will practice using relaxation technique. Patient will identify a new coping skill.  Patient will follow multistep directions to reduce anxiety and stress.   Affect/Mood: Appropriate   Participation Level: Active and Engaged   Participation Quality: Independent   Behavior: Appropriate, Calm, and Cooperative   Speech/Thought Process: Coherent   Insight: Good   Judgement: Good   Modes of Intervention: Activity, Education, and Exploration   Patient Response to Interventions:  Attentive, Engaged, Interested , and Receptive   Education Outcome:  Acknowledges education   Clinical Observations/Individualized Feedback: Amber Cruz was active in their participation of session activities and group discussion. Pt shared that she is familiar with meditation and has done some in the past. Pt appropriately followed along to the prompt. Pt interacted well with LRT and peers duration of session.    Plan: Continue to engage patient in RT group sessions 2-3x/week.   [address], LRT, CTRS 12/14/2023 1:56 PM

## 2023-12-14 NOTE — Progress Notes (Signed)
 [facility] MD Progress Note  12/14/2023  Amber Cruz  MRN:  [id number] Amber Cruz is a 68 y.o. female admitted: Medicallyfor 12/01/2023  2:45 PM with loss of consciousness after overdose. 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 Psychiatry consulted for suicide attempt. Per chart,Patient was in ICU and transferred to medical floor for monitoring. Once patient was medically cleared she was transfer to [facility] psych unit for stabilization.   Subjective:  Chart reviewed, case discussed in multidisciplinary meeting, patient seen during rounds.  On assessment today patient reports she is excited to go home tomorrow.  Patient was pleasant to talk to.  She is well-oriented at baseline.  Patient reports her sleep and appetite has improved, patient denies any intention to harm herself or others.  Patient was encouraged to attend groups and work on coping strategies. She denies auditory/visual hallucinations. Patient denies side effects from meds.   I spoke with patient's husband via phone today.  Husband's concerns were addressed to her satisfaction. Neurology recommendations discussed with the husband. Patient's husband is in agreement with patient's discharge tomorrow.  Patient will get neuropsychological testing done with an outpatient provider.  Sleep: Fair  Appetite:  Fair  Past Psychiatric History: see h&P Family History:  Family History  Problem Relation Age of Onset   Alzheimer's disease Mother    Hypertension Father    Heart failure Father    Social History:  Social History   Substance and Sexual Activity  Alcohol Use No     Social History   Substance and Sexual Activity  Drug Use No    Social History   Socioeconomic History   Marital status: Married    Spouse name: Not on file   Number of children: Not on file   Years of education: Not on file    Highest education level: Not on file  Occupational History   Not on file  Tobacco Use   Smoking status: Never   Smokeless tobacco: Never  Vaping Use   Vaping status: Never Used  Substance and Sexual Activity   Alcohol use: No   Drug use: No   Sexual activity: Not Currently    Birth control/protection: None  Other Topics Concern   Not on file  Social History Narrative   Not on file   Social Drivers of Health   Financial Resource Strain: Low Risk  (04/21/2022)   [facility]   Overall Financial Resource Strain (CARDIA)    Difficulty of Paying Living Expenses: Not hard at all  Food Insecurity: Patient Unable To Answer (12/08/2023)   Hunger Vital Sign    Worried About Running Out of Food in the Last Year: Patient unable to answer    Ran Out of Food in the Last Year: Patient unable to answer  Transportation Needs: Patient Unable To Answer (12/08/2023)   PRAPARE - Transportation    Lack of Transportation (Medical): Patient unable to answer    Lack of Transportation (Non-Medical): Patient unable to answer  Physical Activity: Insufficiently Active (04/21/2022)   [facility]   Exercise Vital Sign    Days of Exercise per Week: 4 days    Minutes of Exercise per Session: 20 min  Stress: No Stress Concern Present (04/21/2022)   Received from [facility] visits [facility] to [facility]26/[facility]   [employer] of Occupational Health - Occupational Stress Questionnaire    Feeling of Stress : Only a little  Social Connections: Unknown (12/08/2023)   Social Connection and Isolation Panel [NHANES]    Frequency of Communication with Friends and Family: Patient unable to answer    Frequency of Social Gatherings with Friends and Family: Patient unable to answer    Attends Religious Services: Patient unable to answer     Active Member of Clubs or Organizations: Patient declined    Attends Banker Meetings: Patient unable to answer    Marital Status: Married   Past Medical History:  Past Medical History:  Diagnosis Date   Aneurysm (HCC)    Anxiety    Frequent infections of left ear    Thyroid disease     Past Surgical History:  Procedure Laterality Date   DESCENDING AORTIC ANEURYSM REPAIR W/ STENT     MYRINGOTOMY Left 2018    Current Medications: Current Facility-Administered Medications  Medication Dose Route Frequency Provider Last Rate Last Admin   acetaminophen (TYLENOL) tablet 650 mg  650 mg Oral Q6H PRN Maryagnes Amos, FNP   650 mg at 12/13/23 1637   alum & mag hydroxide-simeth (MAALOX/MYLANTA) 200-200-20 MG/5ML suspension 30 mL  30 mL Oral Q4H PRN Starkes-Perry, Juel Burrow, FNP       clonazePAM (KLONOPIN) tablet 1 mg  1 mg Oral BID Maryagnes Amos, FNP   1 mg at 12/14/23 1007   hydrocortisone cream 0.5 %   Topical BID Lewanda Rife, MD   Given at 12/14/23 1008   levothyroxine (SYNTHROID) tablet 137 mcg  137 mcg Oral Q0600 Maryagnes Amos, FNP   137 mcg at 12/14/23 0651   LORazepam (ATIVAN) injection 0.5 mg  0.5 mg Intravenous Q6H PRN Maryagnes Amos, FNP       LORazepam (ATIVAN) tablet 0.5 mg  0.5 mg Oral Q6H PRN Verner Chol, MD   0.5 mg at 12/11/23 1423   magnesium hydroxide (MILK OF MAGNESIA) suspension 30 mL  30 mL Oral Daily PRN Starkes-Perry, Juel Burrow, FNP       OLANZapine (ZYPREXA) injection 2.5 mg  2.5 mg Intramuscular BID PRN Starkes-Perry, Juel Burrow, FNP       OLANZapine zydis (ZYPREXA) disintegrating tablet 5 mg  5 mg Oral TID PRN Maryagnes Amos, FNP   5 mg at 12/12/23 2217   sertraline (ZOLOFT) tablet 25 mg  25 mg Oral Daily Maryagnes Amos, FNP   25 mg at 12/14/23 1008    Lab Results:  Results for orders placed or performed during the hospital encounter of 12/08/23 (from the past 48 hours)  RPR     Status: None    Collection Time: 12/13/23  4:26 PM  Result Value Ref Range   RPR Ser Ql NON REACTIVE NON REACTIVE    Comment: Performed at [facility], [address] [place], [place] [address]  Vitamin B12     Status: None   Collection Time: 12/13/23  4:26 PM  Result Value Ref Range   Vitamin B-12 336 180 - 914 pg/mL    Comment: (NOTE) This assay is not validated for testing neonatal or myeloproliferative syndrome specimens for Vitamin B12 levels. Performed at [facility], [address] [place], [place] [address]   TSH     Status: None   Collection Time: 12/13/23  4:26 PM  Result Value Ref Range  TSH 0.369 0.350 - 4.500 uIU/mL    Comment: Performed by a 3rd Generation assay with a functional sensitivity of <=0.01 uIU/mL. Performed at [facility], [address] [place], [place] [address]   Folate     Status: None   Collection Time: 12/13/23  4:26 PM  Result Value Ref Range   Folate 8.1 >5.9 ng/mL    Comment: Performed at [facility], [address] [place], [place] [address]     Blood Alcohol level:  Lab Results  Component Value Date   [facility] <10 12/01/2023    Metabolic Disorder Labs:  No results found for: "PROLACTIN" Lab Results  Component Value Date   TRIG 33 12/02/2023     Psychiatric Specialty Exam:   Presentation  General Appearance:  Appropriate for Environment; Casual   Eye Contact: Fair   Speech: Clear and Coherent   Speech Volume: Normal       Mood and Affect  Mood: "Fine"   Affect: Pleasant     Thought Process  Thought Processes: Coherent   Descriptions of Associations:Intact   Orientation:Full (Time, Place and Person)   Thought Content: Improving   Hallucinations:Hallucinations: None   Ideas of Reference:None   Suicidal Thoughts:Suicidal Thoughts: No   Homicidal Thoughts:Homicidal Thoughts: No     Sensorium  Memory: Improving, patient is oriented   Judgment: Improving    Insight: Improving     Executive Functions  Concentration: Fair   Attention Span: Fair   Recall: Poor     Language: Fair     Psychomotor Activity  Psychomotor Activity: Psychomotor Activity: Normal   Musculoskeletal: Strength & Muscle Tone: within normal limits Gait & Station: normal Assets  Assets: Manufacturing systems engineer; Desire for Improvement; Resilience       Physical Exam: Physical Exam Vitals and nursing note reviewed.  HENT:     Head: Normocephalic.     Mouth/Throat:     Mouth: Mucous membranes are moist.  Eyes:     Pupils: Pupils are equal, round, and reactive to light.  Cardiovascular:     Rate and Rhythm: Normal rate.     Pulses: Normal pulses.   Neurological:     General: No focal deficit present.     Mental Status: She is alert.      Review of Systems  Constitutional: Negative.   HENT: Negative.    Eyes: Negative.   Respiratory: Negative.    Cardiovascular: Negative.   Gastrointestinal: Negative.   Skin: Negative.   Neurological: Negative.    Blood pressure (!) 96/46, pulse 67, temperature 97.9 F (36.6 C), resp. rate 16, weight 102.5 kg, SpO2 94%. Body mass index is 36.48 kg/m.  Diagnosis: Principal Problem:   MDD (major depressive disorder), recurrent episode, severe (HCC)   Safety and Monitoring:             -- Patient admitted on voluntary admission to inpatient psychiatric unit for safety, stabilization and treatment IVC will be initiated on 12/10/2023 until patient and recovers to a point of oriented x 4 and able to understand her treatment plan.             -- Daily contact with patient to assess and evaluate symptoms and progress in treatment             -- Patient's case to be discussed in multi-disciplinary team meeting             -- Observation Level: q15 minute checks             --  Vital signs:  q12 hours             -- Precautions: suicide, elopement, and assault   2. Psychiatric Diagnoses and Treatment:             Continue Klonopin, Synthroid, and Zoloft                 -- Encouraged patient to participate in unit milieu and in scheduled group therapies                            3. Medical Issues Being Addressed:  No urgent medical need is noted   4. Discharge Planning:          Anticipated discharge tomorrow     Lewanda Rife, MD

## 2023-12-14 NOTE — BHH Suicide Risk Assessment (Incomplete)
 [facility] Discharge Suicide Risk Assessment   Principal Problem: MDD (major depressive disorder), recurrent episode, severe (HCC) Discharge Diagnoses: Principal Problem:   MDD (major depressive disorder), recurrent episode, severe [facility])    Psychiatric Specialty Exam:   Presentation  General Appearance:  Appropriate for Environment; Casual   Eye Contact: Fair   Speech: Clear and Coherent   Speech Volume: Normal       Mood and Affect  Mood: "Fine"   Affect: Pleasant     Thought Process  Thought Processes: Coherent   Descriptions of Associations:Intact   Orientation:Full (Time, Place and Person)   Thought Content: Improving   Hallucinations:Hallucinations: None   Ideas of Reference:None   Suicidal Thoughts:Suicidal Thoughts: No   Homicidal Thoughts:Homicidal Thoughts: No     Sensorium  Memory: Improving, patient is oriented   Judgment: Improving   Insight: Improving     Executive Functions  Concentration: Fair   Attention Span: Fair   Recall: Poor     Language: Fair     Psychomotor Activity  Psychomotor Activity: Psychomotor Activity: Normal   Musculoskeletal: Strength & Muscle Tone: within normal limits Gait & Station: normal Assets  Assets: Manufacturing systems engineer; Desire for Improvement; Resilience       Physical Exam: Physical Exam Vitals and nursing note reviewed.  HENT:     Head: Normocephalic.     Mouth/Throat:     Mouth: Mucous membranes are moist.  Eyes:     Pupils: Pupils are equal, round, and reactive to light.  Cardiovascular:     Rate and Rhythm: Normal rate.     Pulses: Normal pulses.   Neurological:     General: No focal deficit present.     Mental Status: She is alert.      Review of Systems  Constitutional: Negative.   HENT: Negative.    Eyes: Negative.   Respiratory: Negative.    Cardiovascular: Negative.   Gastrointestinal: Negative.   Skin: Negative.   Neurological: Negative.     Blood  pressure 114/61, pulse 75, temperature 97.9 F (36.6 C), resp. rate 16, weight 102.5 kg, SpO2 97%. Body mass index is 36.48 kg/m.   Demographic Factors:  Age 26 or older, Caucasian, and Unemployed  Loss Factors: Decrease in vocational status and Decline in physical health  Historical Factors: NA  Risk Reduction Factors:   Sense of responsibility to family, Positive social support, Positive therapeutic relationship, and Positive coping skills or problem solving skills  Continued Clinical Symptoms:  Previous Psychiatric Diagnoses and Treatments   Suicide Risk:  Minimal: No identifiable suicidal ideation.    Follow-up Information     [facility] [place]. Go on 12/18/2023.   Why: Your appointment is scheduled for 12/18/23 virtually at 2:40 PM . Per the office, if you miss this virtual visit your next visit you will have to go in person. As discussed, the office will not allow you to switch providers, if you would like to switch your provider the office requests that you call them directly. Contact information: [address] [place], [place] [address]  [phone]        [facility], [place]. Go on 12/12/2023.   Why: I have contacted this provider to put in a referral for neurology. They will be contacted your daughter to schedule a follow-up with her to request the neurology referral. Please remember to bring power of attorney paperwork per their request. Contact information: [address] [place] [address]  [phone]         [facility] Follow up on 01/10/2024.   Why: Your appointment is scheduled 1:20 PM. Please remember to bring your insurance card and ID. Contact information: [address] [place] [address] [phone]                 Plan Of Care/Follow-up recommendations:  Per Discharge Summary  Lewanda Rife, MD

## 2023-12-15 DIAGNOSIS — F332 Major depressive disorder, recurrent severe without psychotic features: Secondary | ICD-10-CM | POA: Diagnosis not present

## 2023-12-15 NOTE — Progress Notes (Signed)
  [facility] Adult Case Management Discharge Plan :  Will you be returning to the same living situation after discharge:  Yes,  pt will return home  At discharge, do you have transportation home?: Yes,  pt's husband will pick her up Do you have the ability to pay for your medications: Yes,  [employer] / [employer]  Release of information consent forms completed and in the chart;  Patient's signature needed at discharge.  Patient to Follow up at:  Follow-up Information     [facility] [place]. Go on 12/18/2023.   Why: Your appointment is scheduled for 12/18/23 virtually at 2:40 PM . Per the office, if you miss this virtual visit your next visit you will have to go in person. As discussed, the office will not allow you to switch providers, if you would like to switch your provider the office requests that you call them directly. Contact information: [address] [place], [place] [address]  [phone]        [facility], [place]. Go on 12/12/2023.   Why: I have contacted this provider to put in a referral for neurology. They will be contacted your daughter to schedule a follow-up with her to request the neurology referral. Please remember to bring power of attorney paperwork per their request. Contact information: [address] [place] [address] [phone]         [facility] Follow up on 01/10/2024.   Why: Your appointment is scheduled 1:20 PM. Please remember to bring your insurance card and ID. Contact information: [address] [place] [address] [phone]                 Next level of care provider has access to [facility] Link:no  Safety Planning and Suicide Prevention discussed: Valentino Hue  Howell Pringle, daughter, [phone]     Has patient been referred to the Quitline?: Patient does not use tobacco/nicotine products  Patient has been referred for addiction  treatment: No known substance use disorder.  [address] LCSWA 12/15/2023, 8:58 AM
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
## 2023-12-15 NOTE — Progress Notes (Signed)
 D: Pt alert and oriented. Pt denies experiencing any pain, SI/HI, or AVH at this time. Pt reports she will be able to keep herself safe when she returns home. Pt has completed a suicide safety plan and was given a survey to fill out. Transition Record, AVS, and SRA reviewed and given to pt upon discharge.   A: Pt received discharge and medication education/information. Pt belongings were returned and signed for at this time.   R: Pt verbalized understanding of discharge and medication education/information.  Pt escorted by staff via wheelchair to medical mall front lobby where pt was picked up by husband.

## 2023-12-15 NOTE — Plan of Care (Signed)
 ?  Problem: Activity: ?Goal: Interest or engagement in activities will improve ?Outcome: Progressing ?Goal: Sleeping patterns will improve ?Outcome: Progressing ?  ?Problem: Coping: ?Goal: Ability to verbalize frustrations and anger appropriately will improve ?Outcome: Progressing ?Goal: Ability to demonstrate self-control will improve ?Outcome: Progressing ?  ?Problem: Safety: ?Goal: Periods of time without injury will increase ?Outcome: Progressing ?  ?

## 2023-12-15 NOTE — Group Note (Signed)
 Date:  12/15/2023 Time:  4:59 AM  Group Topic/Focus:  Goals Group:   The focus of this group is to help patients establish daily goals to achieve during treatment and discuss how the patient can incorporate goal setting into their daily lives to aide in recovery.    Participation Level:  Active  Participation Quality:  Appropriate  Affect:  Appropriate  Cognitive:  Appropriate  Insight: Good  Engagement in Group:  Engaged  Modes of Intervention:  Discussion  Additional Comments:    Burt Ek 12/15/2023, 4:59 AM

## 2023-12-15 NOTE — Progress Notes (Signed)
   12/14/23 2200  Psych Admission Type (Psych Patients Only)  Admission Status Voluntary  Psychosocial Assessment  Patient Complaints Confusion  Eye Contact Fair  Facial Expression Anxious  Affect Labile  Speech Logical/coherent  Interaction Needy  Motor Activity Slow  Appearance/Hygiene Unremarkable  Behavior Characteristics Cooperative  Mood Labile  Thought Process  Coherency Circumstantial  Content Preoccupation  Delusions None reported or observed  Perception WDL  Hallucination None reported or observed  Judgment Impaired  Confusion Moderate  Danger to Self  Current suicidal ideation? Denies   Patient is anxious and confused. Speech pressured. Fixated on list of names on sheet of paper. Pt believes she's staying in a hotel. Endorses anxiety. Po medication given as scheduled. Tol well. Interacting with peers and staff. Attends group. Support and encouragement provided. Denies SI, HI, AVH, and pain. Routine safety checks conducted every 15 minutes. Patient remains safe at this time.

## 2023-12-15 NOTE — Plan of Care (Signed)
 D: Pt alert and oriented. Pt denies experiencing any anxiety/depression at this time. Pt denies experiencing any pain at this time. Pt denies experiencing any SI/HI, or AVH at this time.   A: Scheduled medications administered to pt, per MD orders. Support and encouragement provided. Frequent verbal contact made. Routine safety checks conducted q15 minutes.   R: No adverse drug reactions noted. Pt verbally contracts for safety at this time. Pt compliant with medications and treatment plan. Pt interacts well with others on the unit. Pt remains safe at this time. Plan of care ongoing.  Problem: Education: Goal: Emotional status will improve Outcome: Not Progressing Goal: Mental status will improve Outcome: Not Progressing

## 2024-01-15 ENCOUNTER — Other Ambulatory Visit: Payer: Self-pay

## 2024-01-15 ENCOUNTER — Ambulatory Visit
Admission: EM | Admit: 2024-01-15 | Discharge: 2024-01-15 | Disposition: A | Discharge status: Home / Self Care | Attending: Family Medicine | Admitting: Family Medicine

## 2024-01-15 DIAGNOSIS — R0981 Nasal congestion: Secondary | ICD-10-CM

## 2024-01-15 DIAGNOSIS — J069 Acute upper respiratory infection, unspecified: Secondary | ICD-10-CM

## 2024-01-15 LAB — POCT INFLUENZA A/B
Influenza A, POC: NEGATIVE
Influenza B, POC: NEGATIVE

## 2024-01-15 LAB — POC SARS CORONAVIRUS 2 AG -  ED: SARS Coronavirus 2 Ag: NEGATIVE

## 2024-01-15 MED ORDER — AMOXICILLIN-POT CLAVULANATE 875-125 MG PO TABS: 1.0000 | ORAL_TABLET | Freq: Two times a day (BID) | ORAL | 0 refills | Status: DC

## 2024-01-15 MED ORDER — FLUTICASONE PROPIONATE 50 MCG/ACT NA SUSP: 2.0000 | Freq: Every day | NASAL | 0 refills | Status: DC

## 2024-01-15 MED ORDER — PREDNISONE 20 MG PO TABS: 20.0000 mg | ORAL_TABLET | Freq: Two times a day (BID) | ORAL | 0 refills | Status: DC

## 2024-01-15 NOTE — Discharge Instructions (Signed)
 Drink lots of fluids Take over-the-counter Mucinex DM or similar for the cough Take prednisone  as directed for 5 days Start Flonase  daily.  Continuing to your symptoms have resolved I have given a written prescription for Augmentin .  Fill and take this if you fail to see improvement as expected

## 2024-01-15 NOTE — ED Provider Notes (Signed)
 Amber Cruz [facility]    CSN: [id number] Arrival date & time: 01/15/24  1717      History   Chief Complaint Chief Complaint  Patient presents with   Cough    HPI Amber Cruz is a 68 y.o. female.   Patient is here for an upper respiratory infection.  She states has been going on for 4 to 5 days.  She states her symptoms are getting worse.  She does not feel like she is responding to over-the-counter medicines.  She is very tired.  A lot of coughing.  Hoarse voice.  Sore throat.  Sinus postnasal drip.  Green drainage.  She states that she is prone to sinus infections.  She feels as though she has a sinus infection at this time.  She does not have underlying allergies.    Past Medical History:  Diagnosis Date   Aneurysm (HCC)    Anxiety    Frequent infections of left ear    Thyroid disease     Patient Active Problem List   Diagnosis Date Noted   MDD (major depressive disorder), recurrent episode, severe (HCC) 12/08/2023   Severe episode of recurrent major depressive disorder, without psychotic features (HCC) 12/06/2023   Unresponsive 12/04/2023   Overdose 12/01/2023   DNR (do not resuscitate) 12/01/2023   Cerebral aneurysm 01/29/2018   Acute serous otitis media of left ear 08/21/2017   Controlled type 2 diabetes mellitus without complication, without long-term current use of insulin  (HCC) 08/11/2017   Arthralgia of both hands 09/22/2016   Acquired hypothyroidism 03/26/2014   Adjustment disorder with anxious mood 03/26/2014    Past Surgical History:  Procedure Laterality Date   DESCENDING AORTIC ANEURYSM REPAIR W/ STENT     MYRINGOTOMY Left 2018    OB History   No obstetric history on file.      Home Medications    Prior to Admission medications   Medication Sig Start Date End Date Taking? Authorizing Provider  amoxicillin -clavulanate (AUGMENTIN ) 875-125 MG tablet Take 1 tablet by mouth every 12 (twelve) hours. 01/15/24  Yes Stephany Ehrich, MD   fluticasone  (FLONASE ) 50 MCG/ACT nasal spray Place 2 sprays into both nostrils daily. 01/15/24  Yes Stephany Ehrich, MD  predniSONE  (DELTASONE ) 20 MG tablet Take 1 tablet (20 mg total) by mouth 2 (two) times daily with a meal. 01/15/24  Yes Stephany Ehrich, MD  clonazePAM  (KLONOPIN ) 1 MG tablet Take 1 tablet (1 mg total) by mouth 2 (two) times daily. 12/14/23   Silas Drivers, MD  levothyroxine  (SYNTHROID ) 137 MCG tablet Take 1 tablet (137 mcg total) by mouth daily at 6 (six) AM. 12/15/23   Silas Drivers, MD  Multiple Vitamin (MULTI-VITAMIN) tablet Take 1 tablet by mouth daily.    [provider]  sertraline  (ZOLOFT ) 25 MG tablet Take 1 tablet (25 mg total) by mouth daily. 12/15/23   Silas Drivers, MD    Family History Family History  Problem Relation Age of Onset   Alzheimer's disease Mother    Hypertension Father    Heart failure Father     Social History Social History   Tobacco Use   Smoking status: Never   Smokeless tobacco: Never  Vaping Use   Vaping status: Never Used  Substance Use Topics   Alcohol use: No   Drug use: No     Allergies   Iodinated contrast media, Ambien [zolpidem], Prochlorperazine, Honey bee venom protein [honey bee venom], and Iodides   Review of Systems Review of Systems  See HPI  Physical Exam Triage Vital Signs ED Triage Vitals  Encounter Vitals Group     BP 01/15/24 1724 121/72     Systolic BP Percentile --      Diastolic BP Percentile --      Pulse Rate 01/15/24 1724 92     Resp 01/15/24 1724 16     Temp 01/15/24 1724 99.3 F (37.4 C)     Temp Source 01/15/24 1724 Oral     SpO2 01/15/24 1724 95 %     Weight --      Height --      Head Circumference --      Peak Flow --      Pain Score 01/15/24 1727 5     Pain Loc --      Pain Education --      Exclude from Growth Chart --    No data found.  Updated Vital Signs BP 121/72   Pulse 92   Temp 99.3 F (37.4 C) (Oral)   Resp 16   SpO2 95%       Physical Exam Constitutional:      General: She is not in acute distress.    Appearance: She is well-developed. She is obese. She is ill-appearing.     Comments: Appears ill.  Hoarse voice  HENT:     Head: Normocephalic and atraumatic.     Right Ear: Ear canal normal.     Left Ear: Ear canal normal.     Ears:     Comments: Left TM has tube.  Right TM is mildly injected    Nose: Congestion and rhinorrhea present.     Comments: Nasal membranes swollen and red.    Mouth/Throat:     Mouth: Mucous membranes are moist.     Pharynx: Posterior oropharyngeal erythema present.     Comments: Posterior pharynx is injected.  Yellow streaks of postnasal drip visible.  Voice is hoarse Eyes:     Conjunctiva/sclera: Conjunctivae normal.     Pupils: Pupils are equal, round, and reactive to light.  Cardiovascular:     Rate and Rhythm: Normal rate and regular rhythm.     Heart sounds: Murmur heard.  Pulmonary:     Effort: Pulmonary effort is normal. No respiratory distress.     Breath sounds: Wheezing present.     Comments: Few scattered inspiratory wheezes Abdominal:     General: There is no distension.     Palpations: Abdomen is soft.  Musculoskeletal:        General: Normal range of motion.     Cervical back: Normal range of motion.  Lymphadenopathy:     Cervical: No cervical adenopathy.  Skin:    General: Skin is warm and dry.  Neurological:     Mental Status: She is alert.      UC Treatments / Results  Labs (all labs ordered are listed, but only abnormal results are displayed) Labs Reviewed  POC SARS CORONAVIRUS 2 AG -  ED  POCT INFLUENZA A/B    EKG   Radiology No results found.  Procedures Procedures (including critical care time)  Medications Ordered in UC Medications - No data to display  Initial Impression / Assessment and Plan / UC Course  I have reviewed the triage vital signs and the nursing notes.  Pertinent labs & imaging results that were available  during my care of the patient were reviewed by me and considered in my medical decision making (see chart for  details).     I encouraged patient to treat conservatively for 7 to 10 days.  Take antibiotics only if fails to improve.  Discussed CDC guidelines for sinus infections. Final Clinical Impressions(s) / UC Diagnoses   Final diagnoses:  Acute upper respiratory infection  Sinus congestion     Discharge Instructions      Drink lots of fluids Take over-the-counter Mucinex DM or similar for the cough Take prednisone  as directed for 5 days Start Flonase  daily.  Continuing to your symptoms have resolved I have given a written prescription for Augmentin .  Fill and take this if you fail to see improvement as expected   ED Prescriptions     Medication Sig Dispense Auth. Provider   predniSONE  (DELTASONE ) 20 MG tablet Take 1 tablet (20 mg total) by mouth 2 (two) times daily with a meal. 10 tablet Stephany Ehrich, MD   fluticasone  (FLONASE ) 50 MCG/ACT nasal spray Place 2 sprays into both nostrils daily. 16 g Stephany Ehrich, MD   amoxicillin -clavulanate (AUGMENTIN ) 875-125 MG tablet Take 1 tablet by mouth every 12 (twelve) hours. 14 tablet Stephany Ehrich, MD      PDMP not reviewed this encounter.   Stephany Ehrich, MD 01/15/24 Jerolyn Moore

## 2024-01-15 NOTE — ED Triage Notes (Signed)
 Has c/o fever, cough, congestion x 3 days. Cough is prod of green sputum. Has had advil.
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
## 2024-02-27 ENCOUNTER — Ambulatory Visit (HOSPITAL_COMMUNITY): Admitting: Psychiatry

## 2024-02-27 ENCOUNTER — Other Ambulatory Visit: Payer: Self-pay

## 2024-02-27 ENCOUNTER — Encounter (HOSPITAL_COMMUNITY): Payer: Self-pay | Admitting: Psychiatry

## 2024-02-27 VITALS — BP 132/71 | HR 71 | Ht 66.0 in | Wt 218.0 lb

## 2024-02-27 DIAGNOSIS — F331 Major depressive disorder, recurrent, moderate: Secondary | ICD-10-CM | POA: Diagnosis not present

## 2024-02-27 DIAGNOSIS — F411 Generalized anxiety disorder: Secondary | ICD-10-CM | POA: Diagnosis not present

## 2024-02-27 MED ORDER — SERTRALINE HCL 100 MG PO TABS: 100.0000 mg | ORAL_TABLET | Freq: Every day | ORAL | 2 refills | Status: DC

## 2024-02-27 MED ORDER — BUSPIRONE HCL 10 MG PO TABS: 10.0000 mg | ORAL_TABLET | Freq: Two times a day (BID) | ORAL | 2 refills | Status: DC

## 2024-02-28 ENCOUNTER — Encounter (HOSPITAL_COMMUNITY): Payer: Self-pay | Admitting: Psychiatry

## 2024-03-05 ENCOUNTER — Encounter (HOSPITAL_COMMUNITY): Payer: Self-pay

## 2024-03-05 DIAGNOSIS — F331 Major depressive disorder, recurrent, moderate: Secondary | ICD-10-CM

## 2024-03-05 DIAGNOSIS — F411 Generalized anxiety disorder: Secondary | ICD-10-CM

## 2024-03-05 MED ORDER — SERTRALINE HCL 100 MG PO TABS: 100.0000 mg | ORAL_TABLET | Freq: Every day | ORAL | 2 refills | Status: DC

## 2024-03-22 ENCOUNTER — Other Ambulatory Visit (HOSPITAL_COMMUNITY): Payer: Self-pay | Admitting: Psychiatry

## 2024-03-22 DIAGNOSIS — F411 Generalized anxiety disorder: Secondary | ICD-10-CM

## 2024-03-22 DIAGNOSIS — F331 Major depressive disorder, recurrent, moderate: Secondary | ICD-10-CM

## 2024-03-25 NOTE — Progress Notes (Unsigned)
 BH MD/PA/NP OP Progress Note  03/26/2024 5:04 PM Amber Cruz  MRN:  [id number]  Visit Diagnosis:    ICD-10-CM   1. MDD (major depressive disorder), recurrent episode, moderate (HCC)  F33.1 sertraline  (ZOLOFT ) 100 MG tablet    busPIRone  (BUSPAR ) 10 MG tablet    2. GAD (generalized anxiety disorder)  F41.1 sertraline  (ZOLOFT ) 100 MG tablet    busPIRone  (BUSPAR ) 10 MG tablet      Assessment: Amber Cruz is a 68 y.o. female with a history of MDD and GAD who presents in person to [facility] for initial evaluation on 02/26/2024.    At initial evaluation patient reported symptoms of excessive worry that she is unable to control, difficulty relaxing, restlessness, increased irritability, and fears something awful happening.  She can develop panic symptoms including shortness of breath, chest tightness, and mental fogginess during the anxiety episodes.  In regards to depression patient reported feeling anhedonic, hopeless, amotivated, worthless, and has had disturbed sleep and appetite.  Likely contributing factor is her increased isolation and loss of sense of purpose since she retired from her job in early 2025.  Patient denied any SI or thoughts of self-harm at initial evaluation though had a prior overdose in March 2025.  She denied this being a suicide attempt but had been having suicidal ideation and was found unconscious on the bed with an open bottle of her husband's old opiate medication.  Safety planning has been discussed and patient's husband is controlling all medications now.  There are also firearms in the house though these are safely secured.  Patient has good supports and both her husband and daughter.  She has grandchildren that she list is protective factor.  Laqueta Bonaventura presents for follow-up evaluation. Today, 03/26/24, patient reports ***  Risk Assessment: An assessment of suicide and violence risk factors was performed as part of this  evaluation and is not significantly changed from the last visit. While future psychiatric events cannot be accurately predicted, the patient does not currently require acute inpatient psychiatric care and does not currently meet [employer]  involuntary commitment criteria. Patient was given contact information for crisis resources, behavioral health clinic and was instructed to call 911 for emergencies.   Plan: # GAD/severe MDD recurrent Past medication trials: Duloxetine, Lexapro (he could not sleep), Wellbutrin (hand tremors), Trintellix (nausea and confusion), Viibryd, Effexor, Ativan , Klonopin , hydroxyzine (over sedating), gabapentin Status of problem: Ongoing Interventions: - Increase Zoloft  to 150 mg daily - Change BuSpar  to 10 mg TID - Continue with therapy - CMP, CBC, B12, folate, lipid panel, A1c, and TSH reviewed  # Memoroy concerns Past medication trials:  Status of problem: Ongoing Interventions: -- Neuropsych testing scheduled for June 23 with Maude Rattler, waiting on test results, 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  She has been more active and able to go out to get ice cream, or pick up her medicines   Going on the Zoloft  has helped. Takes the buspar  at night which seems to helped.   Has gone    Got a report from a neurology which has not been uploaded to the chart yet. But they believe the report had said that  mild late onset alzeimers  Past Psychiatric History:  Past psychiatric diagnoses: MDD and GAD Psychiatric hospitalizations:  Hospitalized following an overdose in March 2025 Past suicide attempts: Overdose in March 2025 patient reports it was accidental Hx of self harm: Denies Hx of violence towards others: Denies Prior psychiatric providers: [facility] Prior therapy: Sees Damien Ghazi for therapy Access to firearms: Yes they are safely secured in the home  Prior medication trials: duloxetine, Lexapro (he could not sleep), Wellbutrin (hand tremors), Trintellix (nausea and confusion), Viibryd, Effexor, Ativan , Klonopin , hydroxyzine (over sedating), gabapentin, Ambien (sleep driving), Strattera (itching)  Substance use: Patient reported using alcohol in the past primarily wine.  Drinks were infrequent averaging once a month.  Denies any other substance use including illicit drugs or tobacco.  Past Medical History:  Past Medical History:  Diagnosis Date   Aneurysm (HCC)    Anxiety    Frequent infections of left ear    Thyroid disease     Past Surgical History:  Procedure Laterality Date   APPENDECTOMY     DESCENDING AORTIC ANEURYSM REPAIR W/ STENT     MYRINGOTOMY Left 2018    Family History:  Family History  Problem Relation Age of Onset   Alzheimer's disease Mother    Hypertension Father    Heart failure Father     Social History:  Social History   Socioeconomic History   Marital status: Married    Spouse name: Not on file   Number of children: Not on file   Years of education: Not on file   Highest education level: Not on file  Occupational History   Not on file  Tobacco Use   Smoking status: Never   Smokeless tobacco: Never  Vaping Use   Vaping status: Never Used  Substance and Sexual Activity   Alcohol use: No   Drug use: No   Sexual activity: Not Currently    Birth control/protection: None  Other Topics Concern   Not on file  Social History Narrative   Not on file   Social Drivers of Health   Financial Resource Strain: Low Risk  (04/21/2022)   Received from [facility] prior to [facility]26/2022[facility]   Overall Financial Resource Strain (CARDIA)    Difficulty of Paying Living Expenses: Not hard at all  Food Insecurity: Patient Unable To Answer (12/08/2023)   Hunger  Vital Sign    Worried About Running Out of Food in the Last Year: Patient unable to answer    Ran Out of Food in the Last Year: Patient unable to answer  Transportation Needs: Patient Unable To Answer (12/08/2023)   PRAPARE - Transportation    Lack of Transportation (Medical): Patient unable to answer    Lack of Transportation (Non-Medical): Patient unable to answer  Physical Activity: Insufficiently Active (04/21/2022)   Received from [facility] visits prior to [facility]/26/2022., [facility]   Exercise Vital Sign    On average, how many days per week do you engage in moderate to strenuous exercise (like a brisk walk)?: 4 days    On average, how many minutes do you engage in exercise at this level?: 20 min  Stress: No Stress Concern Present (04/21/2022)   [facility] prior to [facility]26/2022., [facility]   [employer] of Occupational Health - Occupational Stress Questionnaire    Feeling of Stress : Only a little  Social Connections: Unknown (12/08/2023)   Social Connection and Isolation Panel    Frequency of Communication with Friends and Family: Patient unable to answer  Frequency of Social Gatherings with Friends and Family: Patient unable to answer    Attends Religious Services: Patient unable to answer    Active Member of Clubs or Organizations: Patient declined    Attends Banker Meetings: Patient unable to answer    Marital Status: Married    Allergies:  Allergies  Allergen Reactions   Iodinated Contrast Media Itching and Rash    CT scan done w/ IV dye on 06/08/12. Per pt / [facility] pt needs to be pre-medicated if needs future CT scans w/IV contrast.  CT scan done w/ IV dye on 06/08/12. Per pt / [facility] pt needs to be pre-medicated if needs future CT scans w/IV contrast.     Ambien [Zolpidem] Other (See Comments)    Drove vehicle without knowledge of driving. From [place], [place]   Prochlorperazine Other (See  Comments)   Honey Bee Venom Protein [Honey Bee Venom]    Iodides     Current Medications: Current Outpatient Medications  Medication Sig Dispense Refill   amoxicillin -clavulanate (AUGMENTIN ) 875-125 MG tablet Take 1 tablet by mouth every 12 (twelve) hours. 14 tablet 0   busPIRone  (BUSPAR ) 10 MG tablet Take 1 tablet (10 mg total) by mouth 3 (three) times daily. 270 tablet 0   fluticasone  (FLONASE ) 50 MCG/ACT nasal spray Place 2 sprays into both nostrils daily. (Patient not taking: Reported on 02/27/2024) 16 g 0   levothyroxine  (SYNTHROID ) 137 MCG tablet Take 1 tablet (137 mcg total) by mouth daily at 6 (six) AM. 30 tablet 0   Multiple Vitamin (MULTI-VITAMIN) tablet Take 1 tablet by mouth daily.     predniSONE  (DELTASONE ) 20 MG tablet Take 1 tablet (20 mg total) by mouth 2 (two) times daily with a meal. 10 tablet 0   sertraline  (ZOLOFT ) 100 MG tablet Take 1.5 tablets (150 mg total) by mouth daily. 45 tablet 2   No current facility-administered medications for this visit.     Musculoskeletal: Strength & Muscle Tone: {desc; muscle tone:32375} Gait & Station: {PE GAIT ED WJUO:77474} Patient leans: {Patient Leans:21022755}  Psychiatric Specialty Exam: Blood pressure 116/64, pulse 72, height 5' 6 (1.676 m), weight 221 lb 9.6 oz (100.5 kg).Body mass index is 35.77 kg/m. Review of Systems  General Appearance: {Appearance:22683}  Eye Contact:  {BHH EYE CONTACT:22684}  Speech:  {Speech:22685}  Volume:  {Volume (PAA):22686}  Mood:  {BHH MOOD:22306}  Affect:  {Affect (PAA):22687}  Thought Content: {Thought Content:22690}   Suicidal Thoughts:  {ST/HT (PAA):22692}  Homicidal Thoughts:  {ST/HT (PAA):22692}  Thought Process:  {Thought Process (PAA):22688}  Orientation:  {BHH ORIENTATION (PAA):22689}    Memory: {BHH MEMORY:22881}  Judgment:  {Judgement (PAA):22694}  Insight:  {Insight (PAA):22695}  Concentration:  {Concentration:21399}  Recall:  not formally assessed ***  Fund of Knowledge:  {BHH GOOD/FAIR/POOR:22877}  Language: {BHH GOOD/FAIR/POOR:22877}  Psychomotor Activity:  {Psychomotor (PAA):22696}  Akathisia:  {BHH YES OR NO:22294}  AIMS (if indicated): {Desc; done/not:10129}  Assets:  {Assets (PAA):22698}  ADL's:  {BHH JIO'D:77709}  Cognition: {chl bhh cognition:304700322}  Sleep:  {BHH GOOD/FAIR/POOR:22877}   Metabolic Disorder Labs: Lab Results  Component Value Date   HGBA1C (H) 01/27/2010    6.4 (NOTE)  According to the ADA Clinical Practice Recommendations for 2011, when HbA1c is used as a screening test:   >=6.5%   Diagnostic of Diabetes Mellitus           (if abnormal result  is confirmed)  5.7-6.4%   Increased risk of developing Diabetes Mellitus  References:Diagnosis and Classification of Diabetes Mellitus,Diabetes Care,2011,34(Suppl 1):S62-S69 and Standards of Medical Care in         Diabetes - 2011,Diabetes Care,2011,34  (Suppl 1):S11-S61.   MPG (H) 01/27/2010    137 (NOTE) *** Please note change in reference range(s). ***    No results found for: PROLACTIN Lab Results  Component Value Date   TRIG 33 12/02/2023   Lab Results  Component Value Date   TSH 0.369 12/13/2023   TSH 3.156 12/04/2023    Therapeutic Level Labs: No results found for: LITHIUM No results found for: VALPROATE No results found for: CBMZ   Screenings: AUDIT    Flowsheet Row Admission (Discharged) from 12/08/2023 in [facility]  Alcohol Use Disorder Identification Test Final Score (AUDIT) 0   GAD-7    Flowsheet Row [facility]  Total GAD-7 Score 18   PHQ2-9    Flowsheet Row [facility]/27/2024 [facility]  PHQ-2 Total Score 6  PHQ-9 Total Score 22   Flowsheet Row Office Visit from 02/27/2024 in [facility]  from [facility]/15/2024 in [facility] Admission (Discharged) from 12/08/2023 in [facility] BE[facility]AVIORAL MEDICINE  C-SSRS RISK CATEGORY High Risk No Risk No Risk    Collaboration of Care: Collaboration of Care: {BH OP Collaboration of Care:21014065}  Patient/Guardian was advised Release of Information must be obtained prior to any record release in order to collaborate their care with an outside provider. Patient/Guardian was advised if they have not already done so to contact the registration department to sign all necessary forms in order for us  to release information regarding their care.   Consent: Patient/Guardian gives verbal consent for treatment and assignment of benefits for services provided during this visit. Patient/Guardian expressed understanding and agreed to proceed.    Arvella CHRISTELLA Finder, MD 03/26/2024, 5:04 PM

## 2024-03-26 ENCOUNTER — Ambulatory Visit (HOSPITAL_COMMUNITY): Admitting: Psychiatry

## 2024-03-26 VITALS — BP 116/64 | HR 72 | Ht 66.0 in | Wt 221.6 lb

## 2024-03-26 DIAGNOSIS — F02818 Dementia in other diseases classified elsewhere, unspecified severity, with other behavioral disturbance: Secondary | ICD-10-CM | POA: Diagnosis not present

## 2024-03-26 DIAGNOSIS — F331 Major depressive disorder, recurrent, moderate: Secondary | ICD-10-CM

## 2024-03-26 DIAGNOSIS — G301 Alzheimer's disease with late onset: Secondary | ICD-10-CM

## 2024-03-26 DIAGNOSIS — F411 Generalized anxiety disorder: Secondary | ICD-10-CM | POA: Diagnosis not present

## 2024-03-26 MED ORDER — SERTRALINE HCL 100 MG PO TABS: 150.0000 mg | ORAL_TABLET | Freq: Every day | ORAL | 2 refills | Status: DC

## 2024-03-26 MED ORDER — BUSPIRONE HCL 10 MG PO TABS: 10.0000 mg | ORAL_TABLET | Freq: Three times a day (TID) | ORAL | 0 refills | Status: DC

## 2024-03-27 ENCOUNTER — Encounter (HOSPITAL_COMMUNITY): Payer: Self-pay | Admitting: Psychiatry

## 2024-04-17 ENCOUNTER — Encounter (INDEPENDENT_AMBULATORY_CARE_PROVIDER_SITE_OTHER): Payer: Self-pay

## 2024-04-23 ENCOUNTER — Other Ambulatory Visit (HOSPITAL_COMMUNITY): Payer: Self-pay | Admitting: Psychiatry

## 2024-04-23 DIAGNOSIS — F411 Generalized anxiety disorder: Secondary | ICD-10-CM

## 2024-04-23 DIAGNOSIS — F331 Major depressive disorder, recurrent, moderate: Secondary | ICD-10-CM

## 2024-05-07 ENCOUNTER — Encounter (INDEPENDENT_AMBULATORY_CARE_PROVIDER_SITE_OTHER): Payer: Self-pay

## 2024-05-28 ENCOUNTER — Encounter (HOSPITAL_COMMUNITY): Payer: Self-pay | Admitting: Psychiatry

## 2024-05-28 ENCOUNTER — Ambulatory Visit (HOSPITAL_COMMUNITY): Admitting: Psychiatry

## 2024-05-28 DIAGNOSIS — G301 Alzheimer's disease with late onset: Secondary | ICD-10-CM | POA: Diagnosis not present

## 2024-05-28 DIAGNOSIS — F411 Generalized anxiety disorder: Secondary | ICD-10-CM | POA: Diagnosis not present

## 2024-05-28 DIAGNOSIS — F02818 Dementia in other diseases classified elsewhere, unspecified severity, with other behavioral disturbance: Secondary | ICD-10-CM | POA: Diagnosis not present

## 2024-05-28 DIAGNOSIS — F331 Major depressive disorder, recurrent, moderate: Secondary | ICD-10-CM

## 2024-05-28 MED ORDER — SERTRALINE HCL 100 MG PO TABS: 150.0000 mg | ORAL_TABLET | Freq: Every day | ORAL | 2 refills | Status: DC

## 2024-05-28 MED ORDER — BUSPIRONE HCL 15 MG PO TABS: 15.0000 mg | ORAL_TABLET | Freq: Three times a day (TID) | ORAL | 2 refills | Status: DC

## 2024-05-28 NOTE — Progress Notes (Unsigned)
 BH MD/PA/NP OP Progress Note  05/28/2024 5:12 PM Amber Cruz  MRN:  [id number]  Visit Diagnosis:    ICD-10-CM   1. MDD (major depressive disorder), recurrent episode, moderate (HCC)  F33.1 busPIRone  (BUSPAR ) 15 MG tablet    sertraline  (ZOLOFT ) 100 MG tablet    2. GAD (generalized anxiety disorder)  F41.1 busPIRone  (BUSPAR ) 15 MG tablet    sertraline  (ZOLOFT ) 100 MG tablet    3. Alzheimer's dementia, late onset, with behavioral disturbance (HCC)  G30.1    F02.818       Assessment: Amber Cruz is a 68 y.o. female with a history of MDD and GAD who presents in person to [facility] at [facility] for initial evaluation on 02/26/2024.    At initial evaluation patient reported symptoms of excessive worry that she is unable to control, difficulty relaxing, restlessness, increased irritability, and fears something awful happening.  She can develop panic symptoms including shortness of breath, chest tightness, and mental fogginess during the anxiety episodes.  In regards to depression patient reported feeling anhedonic, hopeless, amotivated, worthless, and has had disturbed sleep and appetite.  Likely contributing factor is her increased isolation and loss of sense of purpose since she retired from her job in early 2025.  Patient denied any SI or thoughts of self-harm at initial evaluation though had a prior overdose in March 2025.  She denied this being a suicide attempt but had been having suicidal ideation and was found unconscious on the bed with an open bottle of her husband's old opiate medication.  Safety planning has been discussed and patient's husband is controlling all medications now.  There are also firearms in the house though these are safely secured.  Patient has good supports and both her husband and daughter.  She has grandchildren that she list is protective factor.  Amber Cruz presents for follow-up evaluation. Today, 05/28/24, patient reports ongoing  improvement in anxiety symptoms.  That said patient can still experience decreased levels of anxiety when isolated or in unfamiliar locations.  She also experiences panic episodes though to a lesser degree compared to the past.  The depression has also improved and been well controlled.  Patient has been increasing behavioral activation in addition to titrating medication both of which are likely contributing to current improvement.  She denies any adverse medication side effects.  She has had some difficulty with taking the BuSpar  3 times a day consistently.  Discussed strategies to improve compliance.  We will titrate BuSpar  to 15 mg 3 times a day.  Psychotherapeutic interventions were used during today's session. From 4:35 PM to 5:10 PM. Therapeutic interventions included empathic listening, supportive therapy, cognitive and behavioral therapy, motivational interviewing. Used supportive interviewing techniques to provide emotional validation. Worked on cognitive reframing techniques and unhelpful thoughts challenged as appropriate. Alternative thoughts developed with guidance. Reviewed some techniques to facilitate increased behavioral activation and discussed grounding techniques. Improvement was evidenced by patient's participation and identified commitment to therapy goals.   Risk Assessment: An assessment of suicide and violence risk factors was performed as part of this evaluation and is not significantly changed from the last visit. While future psychiatric events cannot be accurately predicted, the patient does not currently require acute inpatient psychiatric care and does not currently meet [place]  involuntary commitment criteria. Patient was given contact information for crisis resources, behavioral health clinic and was instructed to call 911 for emergencies.   Plan: # GAD/severe MDD recurrent Past medication trials: Duloxetine, Lexapro (he could not  sleep), Wellbutrin (hand tremors),  Trintellix (nausea and confusion), Viibryd, Effexor, Ativan , Klonopin , hydroxyzine (over sedating), gabapentin Status of problem: Ongoing Interventions: - Increase Zoloft  to 150 mg daily - Increase BuSpar  to 20 mg TID - Continue with therapy - CMP, CBC, B12, folate, lipid panel, A1c, and TSH reviewed  # Dementia/suspected Alzheimer's Past medication trials:  Status of problem: Ongoing Interventions: -- Neuropsych testing scheduled for June 23 with Maude Rattler, was positive for dementia with a strong suspicion of Alzheimer's.  Patient recommended to undergo biomarker testing for confirmation.  Chief Complaint:  Chief Complaint  Patient presents with   Follow-up   HPI: Amber Cruz presents on her own today, wityh daughter joinoing later. Patient had forgotten that her daughter was coming to the appointment.  Amber Cruz reports ongoing improvement over the past month.  She has been increasing her activity and completed things such as training for volunteering with the [employer] and increased her overall driving.  Patient now drives to multiple locations and even came to this appointment on her own.  There is still periods of anxiety and panic which are more frequent when alone or driving to an unknown area.  These episodes are a bit more tolerable though compared to the past.  Patient had an episode when she arrived for this appointment and thought she was in the wrong location.  She still endorsed feeling a lump in her throat and some had pressure at the time of the appointment.  We focused on grounding techniques to address this including the 5 finger breathing method.  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  We suggested looking into alternative volunteering options as well as the senior center.  Amber Cruz is able to identify that the isolation and lack of socialization is difficult for her.  She was open to  looking into further options.  She also mentioned that she getting a puppy in the near future which will keep her busy.  Amber Cruz is taking the sertraline  consistently and the BuSpar  2-3 times a day.  She can occasionally forget the midday dose.  She feels the medications have been helpful in controlling anxiety symptoms and does not notice any adverse side effects.  Patient's daughter confirms Amber Cruz's report.  And feels that Amber Cruz has improved.  That said there is still anxiety present.  Her daughter has noticed that Amber Cruz can focus a lot on the future steps and had difficulty staying in the moment.  Past Psychiatric History:  Past psychiatric diagnoses: MDD and GAD Psychiatric hospitalizations: Hospitalized following an overdose in March 2025 Past suicide attempts: Overdose in March 2025 patient reports it was accidental Hx of self harm: Denies Hx of violence towards others: Denies Prior psychiatric providers: [facility] Prior therapy: Sees Damien Ghazi for therapy Access to firearms: Yes they are safely secured in the home  Prior medication trials: duloxetine, Lexapro (he could not sleep), Wellbutrin (hand tremors), Trintellix (nausea and confusion), Viibryd, Effexor, Ativan , Klonopin , hydroxyzine (over sedating), gabapentin, Ambien (sleep driving), Strattera (itching)  Substance use: Patient reported using alcohol in the past primarily wine.  Drinks were infrequent averaging once a month.  Denies any other substance use including illicit drugs or tobacco.  Past Medical History:  Past Medical History:  Diagnosis Date   Aneurysm (HCC)    Anxiety    Frequent infections of left ear    Thyroid disease     Past Surgical History:  Procedure Laterality  Date   APPENDECTOMY     DESCENDING AORTIC ANEURYSM REPAIR W/ STENT     MYRINGOTOMY Left 2018    Family History:  Family History  Problem Relation Age of Onset   Alzheimer's disease Mother    Hypertension Father    Heart failure  Father     Social History:  Social History   Socioeconomic History   Marital status: Married    Spouse name: Not on file   Number of children: Not on file   Years of education: Not on file   Highest education level: Not on file  Occupational History   Not on file  Tobacco Use   Smoking status: Never   Smokeless tobacco: Never  Vaping Use   Vaping status: Never Used  Substance and Sexual Activity   Alcohol use: No   Drug use: No   Sexual activity: Not Currently    Birth control/protection: None  Other Topics Concern   Not on file  Social History Narrative   Not on file   Social Drivers of Health   Financial Resource Strain: Low Risk  (04/21/2022)   Received from [facility] prior to [facility]26[facility]2022[facility]   Overall Financial Resource Strain (CARDIA)    Difficulty of Paying Living Expenses: Not hard at all  Food Insecurity: Patient Unable To Answer (12/08/2023)   Hunger Vital Sign    Worried About Running Out of Food in the Last Year: Patient unable to answer    Ran Out of Food in the Last Year: Patient unable to answer  Transportation Needs: Patient Unable To Answer (12/08/2023)   PRAPARE - Transportation    Lack of Transportation (Medical): Patient unable to answer    Lack of Transportation (Non-Medical): Patient unable to answer  Physical Activity: Insufficiently Active (04/21/2022)   Received from [facility] visits prior to [facility]/26/2022., [facility]   Exercise Vital Sign    On average, how many days per week do you engage in moderate to strenuous exercise (like a brisk walk)?: 4 days    On average, how many minutes do you engage in exercise at this level?: 20 min  Stress: No Stress Concern Present (04/21/2022)   Received from [facility] visits prior to 11/26/2022., [facility]   [employer] of Occupational Health - Occupational Stress Questionnaire    Feeling of Stress : Only a little   Social Connections: Unknown (12/08/2023)   Social Connection and Isolation Panel    Frequency of Communication with Friends and Family: Patient unable to answer    Frequency of Social Gatherings with Friends and Family: Patient unable to answer    Attends Religious Services: Patient unable to answer    Active Member of Clubs or Organizations: Patient declined    Attends Banker Meetings: Patient unable to answer    Marital Status: Married    Allergies:  Allergies  Allergen Reactions   Iodinated Contrast Media Itching and Rash    CT scan done w/ IV dye on 06/08/12. Per pt / [facility] pt needs to be pre-medicated if needs future CT scans w/IV contrast.  CT scan done w/ IV dye on 06/08/12. Per pt / [facility] pt needs to be pre-medicated if needs future CT scans w/IV contrast.     Ambien [Zolpidem] Other (See Comments)    Drove vehicle without knowledge of driving. From [place], [place]   Prochlorperazine Other (See Comments)   Honey Bee Venom Protein [  Honey Bee Venom]    Iodides     Current Medications: Current Outpatient Medications  Medication Sig Dispense Refill   amoxicillin -clavulanate (AUGMENTIN ) 875-125 MG tablet Take 1 tablet by mouth every 12 (twelve) hours. 14 tablet 0   busPIRone  (BUSPAR ) 15 MG tablet Take 1 tablet (15 mg total) by mouth 3 (three) times daily. 90 tablet 2   fluticasone  (FLONASE ) 50 MCG/ACT nasal spray Place 2 sprays into both nostrils daily. (Patient not taking: Reported on 02/27/2024) 16 g 0   levothyroxine  (SYNTHROID ) 137 MCG tablet Take 1 tablet (137 mcg total) by mouth daily at 6 (six) AM. 30 tablet 0   Multiple Vitamin (MULTI-VITAMIN) tablet Take 1 tablet by mouth daily.     predniSONE  (DELTASONE ) 20 MG tablet Take 1 tablet (20 mg total) by mouth 2 (two) times daily with a meal. 10 tablet 0   sertraline  (ZOLOFT ) 100 MG tablet Take 1.5 tablets (150 mg total) by mouth daily. 45 tablet 2   No current facility-administered medications for this visit.      Musculoskeletal: Strength & Muscle Tone: within normal limits Gait & Station: normal Patient leans: N/A  Psychiatric Specialty Exam: There were no vitals taken for this visit.There is no height or weight on file to calculate BMI. Review of Systems  General Appearance: Well Groomed  Eye Contact:  Good  Speech:  Clear and Coherent and Normal Rate  Volume:  Normal  Mood:  Anxious  Affect:  Appropriate and Congruent  Thought Content: Logical   Suicidal Thoughts:  No  Homicidal Thoughts:  No  Thought Process:  Coherent  Orientation:  Full (Time, Place, and Person)    Memory: Immediate;   Poor  Judgment:  Fair  Insight:  Fair  Concentration:  Concentration: Fair  Recall:  not formally assessed   Fund of Knowledge: Fair  Language: Good  Psychomotor Activity:  Normal  Akathisia:  NA  AIMS (if indicated): not done  Assets:  Communication Skills Desire for Improvement Financial Resources/Insurance  ADL's:  Intact  Cognition: Impaired,  Mild  Sleep:  Good   Metabolic Disorder Labs:  No results found for: PROLACTIN Lab Results  Component Value Date   TRIG 33 12/02/2023   Lab Results  Component Value Date   TSH 0.369 12/13/2023   TSH 3.156 12/04/2023    Therapeutic Level Labs: No results found for: LITHIUM No results found for: VALPROATE No results found for: CBMZ   Screenings: AUDIT    Flowsheet Row Admission (Discharged) from 12/08/2023 in [facility]  Alcohol Use Disorder Identification Test Final Score (AUDIT) 0   GAD-7    Flowsheet Row [facility]  Total GAD-7 Score 18   PHQ2-9    Flowsheet Row [facility] Visit [facility] 02/27/2024 [facility]  PHQ-2 Total Score 6  PHQ-9 Total Score 22   Flowsheet Row Office Visit from 02/27/2024 in [facility] from [facility] at [facility] Admission (Discharged) from 12/08/2023 in [facility] BE[facility]AV[facility]AL [facility]  C-SSRS RISK CATEGORY High Risk No Risk No Risk    Collaboration of Care: Collaboration of Care: Medication Management AEB medication prescription and Other provider involved in patient's care AEB neuropsych testing review  Patient/Guardian was advised Release of Information must be obtained prior to any record release in order to collaborate their care with an outside provider. Patient/Guardian was advised if they have not already done so  to contact the registration department to sign all necessary forms in order for us  to release information regarding their care.   Consent: Patient/Guardian gives verbal consent for treatment and assignment of benefits for services provided during this visit. Patient/Guardian expressed understanding and agreed to proceed.    Arvella CHRISTELLA Finder, MD 05/28/2024, 5:12 PM

## 2024-06-19 ENCOUNTER — Other Ambulatory Visit (HOSPITAL_COMMUNITY): Payer: Self-pay | Admitting: Psychiatry

## 2024-06-19 DIAGNOSIS — F411 Generalized anxiety disorder: Secondary | ICD-10-CM

## 2024-06-19 DIAGNOSIS — F331 Major depressive disorder, recurrent, moderate: Secondary | ICD-10-CM

## 2024-06-21 NOTE — Progress Notes (Signed)
 Patient ID:  Amber Cruz is a 68 y.o.  [birth year]   female     Animal Bite (Pt states that she was bitten by her puppy and wants to be checked for any signs of infection. Pt states that the puppy is 41 weeks old and is teething. Pt states that she's only used neosporin for treatment. )     New problem to me Accompanied by:                 self History given by patient:  Patient was bitten and scratched by puppy. Patient has been taking care of them with neosporin. Patient has been having some increased redness, and tenderness, with swelling in both arms from bites and scratches. Patient states puppy is up to date on vaccination. Patient is unsure of tetanus status.  No previous history of abscess/MRSA Duration: 1 week Location: both arms Intensity/quality:                  Moderate to severe pain constant ache with sharp pain with manipulation Draining:                              no Feverish:                             yes Timing:                                progressively worsening  Alleviate:                             Has tried moist compresses and OTC meds with minimal relief  Aggravate:                          Manipulation  No N/V No Diarrhea/Constipation No Urinary symptoms No other skin lesion No fever No URI symptoms Review of Systems:  All others reviewed and negative except as listed above.   Past Medical History, Past surgery History, Allergies, Social history, and Family History were reviewed and updated.     Exam: BP (!) 118/59   Pulse 63   Temp 98.8 F (37.1 C) (Tympanic)   Resp 18   Ht 1.676 m (5' 6)   Wt 109 kg (240 lb)   SpO2 100%   BMI 38.74 kg/m   Constitutional: Well-nourished, well-developed no acute distress Neuro: Alert and oriented x3 Psych:  normal affect Head: Atraumatic normocephalic Lymphatics: No lymphadenopathy CV: Regular rate and rhythm without murmurs rubs or gallops Respiratory: Clear to auscultation bilaterally  without wheezes rales or rhonchi Skin:  Location: multiple abrasion and puncture wound both arms     erythematous      -purulent drainage, warm to touch, tender to palpitation    No results found for this or any previous visit (from the past 24 hours).     Impression/Plan Incision and drainage was not performed. Procedure note above.  Likely abscess. No indication of systemic infection or sepsis.   1. Cellulitis, unspecified cellulitis site (Primary) - amoxicillin -pot clavulanate (AUGMENTIN ) 875-125 mg per tablet; Take 1 tablet by mouth 2 (two) times a day for 7 days.  Dispense: 14 tablet; Refill: 0 - mupirocin (BACTROBAN) 2 % ointment; Apply topically 3 (three)  times a day for 7 days.  Dispense: 22 g; Refill: 0  2. Dog bite, initial encounter - amoxicillin -pot clavulanate (AUGMENTIN ) 875-125 mg per tablet; Take 1 tablet by mouth 2 (two) times a day for 7 days.  Dispense: 14 tablet; Refill: 0 - mupirocin (BACTROBAN) 2 % ointment; Apply topically 3 (three) times a day for 7 days.  Dispense: 22 g; Refill: 0     Patient has been instructed on RX/ OTC medications, dosages, side effects, and possible interactions as associated with each diagnosis in my impression and plan above.  2.   Patient education  (verbal/handout) given on diagnosis, pathophysiology, treatment of diagnosis, side effects of medication use for treatment, restrictions while taking medication, supportive       measures such  as  keeping wound dressed and bandaged, warm moist compresses/soaks.        Red Flags associated with diagnosis/es were reviewed and patient instructed on action plan if red flags develop.  3.  Patient was instructed on follow up care:        Discharged to Home Care with Follow up as previously directed by PCP              They have been instructed that if symptoms worse that should go to Urgent Care, go to the nearest ED, or activate EMS.  4.  Patient agreed with plan and voiced understanding.   NO barriers to adherence perceived by myself.  Urgent Care Disposition:  Follow up with PCP

## 2024-06-23 ENCOUNTER — Other Ambulatory Visit (HOSPITAL_COMMUNITY): Payer: Self-pay | Admitting: Psychiatry

## 2024-06-23 DIAGNOSIS — F331 Major depressive disorder, recurrent, moderate: Secondary | ICD-10-CM

## 2024-06-23 DIAGNOSIS — F411 Generalized anxiety disorder: Secondary | ICD-10-CM

## 2024-06-25 ENCOUNTER — Emergency Department (HOSPITAL_COMMUNITY)

## 2024-06-25 ENCOUNTER — Emergency Department (HOSPITAL_COMMUNITY)
Admission: EM | Admit: 2024-06-25 | Discharge: 2024-06-25 | Disposition: A | Discharge status: Home / Self Care | Attending: Emergency Medicine | Admitting: Emergency Medicine

## 2024-06-25 DIAGNOSIS — E039 Hypothyroidism, unspecified: Secondary | ICD-10-CM | POA: Insufficient documentation

## 2024-06-25 DIAGNOSIS — E119 Type 2 diabetes mellitus without complications: Secondary | ICD-10-CM | POA: Insufficient documentation

## 2024-06-25 DIAGNOSIS — F439 Reaction to severe stress, unspecified: Secondary | ICD-10-CM | POA: Diagnosis not present

## 2024-06-25 DIAGNOSIS — G43109 Migraine with aura, not intractable, without status migrainosus: Secondary | ICD-10-CM

## 2024-06-25 DIAGNOSIS — R55 Syncope and collapse: Secondary | ICD-10-CM | POA: Diagnosis present

## 2024-06-25 DIAGNOSIS — R519 Headache, unspecified: Secondary | ICD-10-CM | POA: Diagnosis not present

## 2024-06-25 LAB — COMPREHENSIVE METABOLIC PANEL WITH GFR
ALT: 17 U/L (ref 0–44)
AST: 19 U/L (ref 15–41)
Albumin: 4.1 g/dL (ref 3.5–5.0)
Alkaline Phosphatase: 48 U/L (ref 38–126)
Anion gap: 12 (ref 5–15)
BUN: 10 mg/dL (ref 8–23)
CO2: 24 mmol/L (ref 22–32)
Calcium: 9.2 mg/dL (ref 8.9–10.3)
Chloride: 103 mmol/L (ref 98–111)
Creatinine, Ser: 0.82 mg/dL (ref 0.44–1.00)
GFR, Estimated: >60 mL/min (ref >60)
Glucose, Bld: 137 mg/dL — ABNORMAL HIGH (ref 70–99)
Potassium: 3.5 mmol/L (ref 3.5–5.1)
Sodium: 139 mmol/L (ref 135–145)
Total Bilirubin: 0.8 mg/dL (ref 0.0–1.2)
Total Protein: 6.9 g/dL (ref 6.5–8.1)

## 2024-06-25 LAB — URINALYSIS, W/ REFLEX TO CULTURE (INFECTION SUSPECTED)
Bilirubin Urine: NEGATIVE
Glucose, UA: NEGATIVE mg/dL
Hgb urine dipstick: NEGATIVE
Ketones, ur: NEGATIVE mg/dL
Nitrite: NEGATIVE
Protein, ur: NEGATIVE mg/dL
Specific Gravity, Urine: 1.004 — ABNORMAL LOW (ref 1.005–1.030)
pH: 6 (ref 5.0–8.0)

## 2024-06-25 LAB — CBC
HCT: 43.9 % (ref 36.0–46.0)
Hemoglobin: 14.2 g/dL (ref 12.0–15.0)
MCH: 29.4 pg (ref 26.0–34.0)
MCHC: 32.3 g/dL (ref 30.0–36.0)
MCV: 90.9 fL (ref 80.0–100.0)
Platelets: 191 K/uL (ref 150–400)
RBC: 4.83 MIL/uL (ref 3.87–5.11)
RDW: 14.4 % (ref 11.5–15.5)
WBC: 6.7 K/uL (ref 4.0–10.5)
nRBC: 0 % (ref 0.0–0.2)

## 2024-06-25 LAB — DIFFERENTIAL
Abs Immature Granulocytes: 0.02 K/uL (ref 0.00–0.07)
Basophils Absolute: 0 K/uL (ref 0.0–0.1)
Basophils Relative: 0 %
Eosinophils Absolute: 0.1 K/uL (ref 0.0–0.5)
Eosinophils Relative: 2 %
Immature Granulocytes: 0 %
Lymphocytes Relative: 15 %
Lymphs Abs: 1 K/uL (ref 0.7–4.0)
Monocytes Absolute: 0.4 K/uL (ref 0.1–1.0)
Monocytes Relative: 7 %
Neutro Abs: 5.1 K/uL (ref 1.7–7.7)
Neutrophils Relative %: 76 %

## 2024-06-25 LAB — RAPID URINE DRUG SCREEN, HOSP PERFORMED
Amphetamines: NOT DETECTED
Barbiturates: NOT DETECTED
Benzodiazepines: NOT DETECTED
Cocaine: NOT DETECTED
Opiates: NOT DETECTED
Tetrahydrocannabinol: NOT DETECTED

## 2024-06-25 LAB — I-STAT CHEM 8, ED
BUN: 11 mg/dL (ref 8–23)
Calcium, Ion: 1.17 mmol/L (ref 1.15–1.40)
Chloride: 103 mmol/L (ref 98–111)
Creatinine, Ser: 0.8 mg/dL (ref 0.44–1.00)
Glucose, Bld: 130 mg/dL — ABNORMAL HIGH (ref 70–99)
HCT: 43 % (ref 36.0–46.0)
Hemoglobin: 14.6 g/dL (ref 12.0–15.0)
Potassium: 3.5 mmol/L (ref 3.5–5.1)
Sodium: 140 mmol/L (ref 135–145)
TCO2: 24 mmol/L (ref 22–32)

## 2024-06-25 LAB — ETHANOL: Alcohol, Ethyl (B): <15 mg/dL (ref <15)

## 2024-06-25 LAB — PROTIME-INR
INR: 0.9 (ref 0.8–1.2)
Prothrombin Time: 13.1 s (ref 11.4–15.2)

## 2024-06-25 LAB — APTT: aPTT: 28 s (ref 24–36)

## 2024-06-25 LAB — CBG MONITORING, ED: Glucose-Capillary: 117 mg/dL — ABNORMAL HIGH (ref 70–99)

## 2024-06-25 MED ORDER — ACETAMINOPHEN 325 MG PO TABS
650.0000 mg | ORAL_TABLET | Freq: Once | ORAL | Status: AC
  Administered 2024-06-25: 650 mg via ORAL
  Filled 2024-06-25: qty 2

## 2024-06-25 MED ORDER — GADOBUTROL 1 MMOL/ML IV SOLN
10.0000 mL | Freq: Once | INTRAVENOUS | Status: AC | PRN
Start: 2024-06-25 — End: 2024-06-25
  Administered 2024-06-25: 10 mL via INTRAVENOUS

## 2024-06-25 MED ORDER — INDOMETHACIN 25 MG PO CAPS
50.0000 mg | ORAL_CAPSULE | Freq: Once | ORAL | Status: DC
  Filled 2024-06-25: qty 2

## 2024-06-25 MED ORDER — METOCLOPRAMIDE HCL 5 MG/ML IJ SOLN: 10.0000 mg | Freq: Once | INTRAMUSCULAR | Status: DC

## 2024-06-25 MED ORDER — PROCHLORPERAZINE EDISYLATE 10 MG/2ML IJ SOLN
10.0000 mg | Freq: Once | INTRAMUSCULAR | Status: AC
  Administered 2024-06-25: 10 mg via INTRAVENOUS

## 2024-06-25 MED ORDER — LORAZEPAM 1 MG PO TABS
2.0000 mg | ORAL_TABLET | Freq: Once | ORAL | Status: AC | PRN
  Administered 2024-06-25: 2 mg via ORAL
  Filled 2024-06-25: qty 2

## 2024-06-25 MED ORDER — ACETAMINOPHEN 500 MG PO TABS
1000.0000 mg | ORAL_TABLET | Freq: Once | ORAL | Status: AC
Start: 2024-06-25 — End: 2024-06-25
  Administered 2024-06-25: 1000 mg via ORAL
  Filled 2024-06-25: qty 2

## 2024-06-25 MED ORDER — SODIUM CHLORIDE 0.9 % IV SOLN: INTRAVENOUS | Status: DC

## 2024-06-25 NOTE — ED Notes (Signed)
 Returned from MRI, was able to complete

## 2024-06-25 NOTE — ED Provider Notes (Signed)
 Syncope at PCP Hx of benzo abuse Stroke alert called but improved once arrived Ct head normal Waiting on MRI Dispo per neuro Physical Exam  BP 129/62 (BP Location: Right Arm)   Pulse 61   Temp 98.5 F (36.9 C) (Oral)   Resp 18   Wt 107.3 kg   SpO2 91%   BMI 38.17 kg/m   Physical Exam Constitutional:      General: She is not in acute distress. HENT:     Mouth/Throat:     Mouth: Mucous membranes are moist.  Eyes:     Conjunctiva/sclera: Conjunctivae normal.  Cardiovascular:     Rate and Rhythm: Normal rate.  Pulmonary:     Effort: Pulmonary effort is normal.  Musculoskeletal:     Cervical back: Neck supple.  Skin:    General: Skin is warm.  Neurological:     General: No focal deficit present.     Mental Status: She is alert.     Procedures  Procedures  ED Course / MDM    Medical Decision Making Amount and/or Complexity of Data Reviewed Labs: ordered. Radiology: ordered.  Risk OTC drugs. Prescription drug management.   Patient was initially refusing MRI and asking for more Ativan .  On reevaluation, the patient was sleeping and calm from the Ativan  we gave.  Neurology called to discuss the MRI and they wish to proceed with the imaging.  We were able to successfully get the MRI done, which came back unremarkable. Neurology came back to the patient's bedside and spoke with her regarding her symptoms.  She denies any further headache or complaints.  Patient stable for discharge.         Devaun Hernandez, DO 06/27/24 [phone]
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
## 2024-06-25 NOTE — ED Notes (Signed)
 Pt ambulated to bathroom with no assistance.

## 2024-06-25 NOTE — Code Documentation (Signed)
 Stroke Response Nurse Documentation Code Documentation  Amber Cruz is a 68 y.o. female arriving to [facility]  via [facility] on 06/25/2024 with past medical hx of depression, anxiety, cerebellar aneurysm 01/2018, hypothyroidism, diabetes type 2 and history of overdose. On No antithrombotic.   Patient from doctor's office getting labs where she was LKW at 1041 when it was reported she had a syncopal episode and fell to the ground. Given Narcan by facility staff without improvement. EMS activated code stroke due to right sided weakness, headache and right facial droop.   Stroke team at the bedside on patient arrival. Labs drawn and patient cleared for CT by Dr. Laurice. Patient to CT with team. NIHSS 6, see documentation for details and code stroke times. Patient initially presented with disoriented, bilateral leg weakness, bilateral limb ataxia, and right decreased sensation on exam. NIHSS improved to 4 while in CT. The following imaging was completed:  CT Head. Patient is not a candidate for IV Thrombolytic due to stroke not suspected. Patient is not a candidate for IR due to no LVO. No CTA due to contrast allergy. MRI ordered. Reports 10/10 right sided headache. 10mg  IV Compazine given. Noted allergy to compazine in chart. RN to monitor closely.   Care Plan: Q30 while in window, MRI to determine plan of care.   Bedside handoff with ED RN Flavia.    Madelin Manila Stroke Response RN

## 2024-06-25 NOTE — ED Provider Notes (Signed)
 [place] EMERGENCY DEPARTMENT AT [facility] Provider Note   CSN: [id number] Arrival date & time: 06/25/24  1217     Patient presents with: No chief complaint on file.   Amber Cruz is a 68 y.o. female.  {Add pertinent medical, surgical, social history, OB history to HPI:32947} 68 y.o. female with past medical hx of depression, anxiety, cerebellar aneurysm 01/2018, hypothyroidism, diabetes type 2 and history of overdose.  Patient is not anticoagulated.   Patient from primary doctor's office getting labs where she was LKW at 1041 when it was reported she had a syncopal episode and fell to the ground. Given Narcan by facility staff without improvement. EMS activated code stroke due to right sided weakness, headache and right facial droop.  PCP contacted.  He is concerned about possible substance use or abuse.  She has a history of overdose in the past.   Patient seen by neuro/stroke team on arrival.  Patient is not a candidate for acute neurointervention.  Initial CT head is without acute abnormality.  MRI brain ordered and pending.  At the time of my exam the patient primarily complains of diffuse headache.  She is grossly intact neurologically      The history is provided by the patient, medical records and the EMS personnel.       Prior to Admission medications   Medication Sig Start Date End Date Taking? Authorizing Provider  amoxicillin -clavulanate (AUGMENTIN ) 875-125 MG tablet Take 1 tablet by mouth every 12 (twelve) hours. 01/15/24   Maranda Jamee Jacob, MD  busPIRone  (BUSPAR ) 15 MG tablet Take 1 tablet (15 mg total) by mouth 3 (three) times daily. 05/28/24   Carvin Arvella HERO, MD  fluticasone  (FLONASE ) 50 MCG/ACT nasal spray Place 2 sprays into both nostrils daily. Patient not taking: Reported on 02/27/2024 01/15/24   Maranda Jamee Jacob, MD  levothyroxine  (SYNTHROID ) 137 MCG tablet Take 1 tablet (137 mcg total) by mouth daily at 6 (six) AM. 12/15/23   Victoria Ruts,  MD  Multiple Vitamin (MULTI-VITAMIN) tablet Take 1 tablet by mouth daily.    [provider]  predniSONE  (DELTASONE ) 20 MG tablet Take 1 tablet (20 mg total) by mouth 2 (two) times daily with a meal. 01/15/24   Maranda Jamee Jacob, MD  sertraline  (ZOLOFT ) 100 MG tablet Take 1.5 tablets (150 mg total) by mouth daily. 05/28/24   Carvin Arvella HERO, MD    Allergies: Iodinated contrast media, Ambien [zolpidem], Prochlorperazine, Honey bee venom protein [honey bee venom], and Iodides    Review of Systems  All other systems reviewed and are negative.   Updated Vital Signs Wt 107.3 kg   BMI 38.17 kg/m   Physical Exam Vitals and nursing note reviewed.  Constitutional:      General: She is not in acute distress.    Appearance: Normal appearance. She is well-developed.  HENT:     Head: Normocephalic and atraumatic.  Eyes:     Conjunctiva/sclera: Conjunctivae normal.     Pupils: Pupils are equal, round, and reactive to light.  Cardiovascular:     Rate and Rhythm: Normal rate and regular rhythm.     Heart sounds: Normal heart sounds.  Pulmonary:     Effort: Pulmonary effort is normal. No respiratory distress.     Breath sounds: Normal breath sounds.  Abdominal:     General: There is no distension.     Palpations: Abdomen is soft.     Tenderness: There is no abdominal tenderness.  Musculoskeletal:  General: No deformity. Normal range of motion.     Cervical back: Normal range of motion and neck supple.  Skin:    General: Skin is warm and dry.  Neurological:     General: No focal deficit present.     Mental Status: She is alert and oriented to person, place, and time.     (all labs ordered are listed, but only abnormal results are displayed) Labs Reviewed  I-STAT CHEM 8, ED - Abnormal; Notable for the following components:      Result Value   Glucose, Bld 130 (*)    All other components within normal limits  CBG MONITORING, ED - Abnormal; Notable for the following  components:   Glucose-Capillary 117 (*)    All other components within normal limits  PROTIME-INR  APTT  CBC  DIFFERENTIAL  ETHANOL  COMPREHENSIVE METABOLIC PANEL WITH GFR  RAPID URINE DRUG SCREEN, HOSP PERFORMED    EKG: None  Radiology: CT HEAD CODE STROKE WO CONTRAST Result Date: 06/25/2024 EXAM: CT HEAD WITHOUT CONTRAST 06/25/2024 12:25:07 PM TECHNIQUE: CT of the head was performed without the administration of intravenous contrast. Automated exposure control, iterative reconstruction, and/or weight based adjustment of the mA/kV was utilized to reduce the radiation dose to as low as reasonably achievable. COMPARISON: CT of the head dated 12/01/2023. CLINICAL HISTORY: Neuro deficit, acute, stroke suspected. [id number]; Right weakness. Right facial droop. HA. FINDINGS: BRAIN AND VENTRICLES: No acute hemorrhage. No hydrocephalus. No extra-axial collection. No mass effect or midline shift. There is age-related cerebral volume loss and mild cerebral white matter disease. There are embolization coils and a vascular stent in the left carotid space at the base of the skull as before. [place] Stroke Program Early CT (ASPECT) Score: Ganglionic (caudate, IC, Lentiform Nucleus, insula, M1-M3): 7 Supraganglionic (M4-M6): 3 Total: 10 ORBITS: No acute abnormality. SINUSES: No acute abnormality. SOFT TISSUES AND SKULL: No acute soft tissue abnormality. No skull fracture. IMPRESSION: 1. No acute intracranial abnormality. 2. Age-related cerebral volume loss and mild chronic microvascular ischemic changes. 3. Left skull base carotid embolization coils and vascular stent, unchanged. 4. The above findings were communicated to Dr. Lindzen at 12:27 pm on 06/25/24. Electronically signed by: Evalene Coho MD 06/25/2024 12:32 PM EDT RP Workstation: [id number]    {Document cardiac monitor, telemetry assessment procedure when appropriate:32947} Procedures   Medications Ordered in the ED  0.9 %  sodium chloride   infusion (has no administration in time range)  metoCLOPramide (REGLAN) injection 10 mg (has no administration in time range)  acetaminophen  (TYLENOL ) tablet 1,000 mg (has no administration in time range)      {Click here for ABCD2, HEART and other calculators REFRESH Note before signing:1}                              Medical Decision Making Amount and/or Complexity of Data Reviewed Labs: ordered. Radiology: ordered.  Risk OTC drugs. Prescription drug management.   ***  {Document critical care time when appropriate  Document review of labs and clinical decision tools ie CHADS2VASC2, etc  Document your independent review of radiology images and any outside records  Document your discussion with family members, caretakers and with consultants  Document social determinants of health affecting pt's care  Document your decision making why or why not admission, treatments were needed:32947:::1}   Final diagnoses:  None    ED Discharge Orders     None

## 2024-06-25 NOTE — ED Triage Notes (Signed)
 Patient BIB [facility] from the her doctor's office. Patient was walking into her appointment to see her PCP to get blood drawn. Patient had a syncope episode at the facility doorway. She lost consciousness, but ems reported when they got there she was awake. Patient was caught by her daughter before she when to the floor. Saff at her pCP gave her Narcan 1 mg, because patient have substance abuse with benzo. EMS reported she was laying on her side c/o back of head and midline of forehead pain. She had right side weakness, inappropriate speech meaning she will flip her words. Her coordination was off and she c/o blurry vision. Ems reported her sensation was different. On the right it was dull and the left side normal. She had no problem with breathing or no pinpoint pupils. Vital signs: Bp 140/84, Saturation 99% RA, HR 50-59 and blood sugar 138.

## 2024-06-25 NOTE — Discharge Instructions (Signed)
 Please follow-up with your primary care physician regarding this ED stay.  Return to emergency department if you have worsening or continued symptoms.

## 2024-06-25 NOTE — ED Notes (Signed)
 Patient transported to MRI

## 2024-06-25 NOTE — ED Notes (Signed)
 MRI notified of Pt's change in MRI orders and that the physician thinks the pt will be able to tolerate the MRI now. MRI will come for the patient as soon as available.

## 2024-06-25 NOTE — ED Notes (Signed)
 Patient voided, did not have to do In and out cath.

## 2024-06-26 NOTE — Progress Notes (Signed)
 NEUROLOGY CONSULT FOLLOW UP NOTE   Date of service: June 26, 2024 Patient Name: Akeya Ryther MRN:  [id number] DOB:  Mar 03, 1949  Interval Hx/subjective   Patient unable to complete MRI brain due to anxiety.   Collateral from daughter & partner: Patient was her normal self this morning however around 1030am, reported having a headache to her partner over the phone. She thought it was because she had not eaten anything. She went to a doctor's appointment that had already been scheduled. She was standing waiting outside for her daughter and called her, asking where she was. When the daughter arrived, the patient appeared not like herself. She gradually fell down and daughter was able to assist her down to ground. Patient became unresponsive. Medical staff approached and were unable to wake her up. They noted she felt warm. She was given Narcan without response. She had a thready pulse so pads were placed on her for potential shock. She then had 2 brief periods of upper body tremulousness. Daughter describes these as small tremors like shivering. Daughter has daughter with epilepsy and denied patient's movements as rhythmic and stereotyped. She was then BIBEMS to [facility] ED where a slight right facial droop and right-sided weakness was noted and therefore Code Stroke activated. Partner at bedside confirmed that there is asymmetry however this was chronic.   Vitals   Vitals:   06/25/24 2152 06/25/24 2200 06/25/24 2245 06/25/24 2250  BP: (!) 98/57 110/65 117/65   Pulse: 65 68 69   Resp: (!) 21 (!) 21 13   Temp:    98.5 F (36.9 C)  TempSrc:    Oral  SpO2: 94% 93% 95%   Weight:         Body mass index is 38.17 kg/m.  Physical Exam   Constitutional: Appears well-developed and well-nourished.  Psych: Affect appropriate to situation.  Eyes: No scleral injection. Eyes are kept close, due to photophobia. HENT: No OP obstrucion.  Head: Normocephalic.  Cardiovascular: Normal rate and  regular rhythm.  Respiratory: Effort normal, non-labored breathing.  GI: Soft.   Skin: WDI.   Neurologic Examination   Mental status: alert, oriented to person, place and time (year and month, but not date). Able to provide history. Speech: no dysarthria, word-finding difficulty, paraphasic errors. Cranial nerves: PERRL EOMI VF full Face sensation intact bilaterally. Face symmetric at rest and with activation. Hearing grossly intact. Palate elevation symmetric Tongue protrudes midline and has full range of motion. SCM's full strength bilaterally. Motor: Normal bulk and tone. No abnormal movements RUE: shoulder abduction 5/5, biceps 5/5, triceps 5/5, wrist flexion 5/5, wrist extension 5/5, hand grip 5/5 LUE: shoulder abduction 5/5, biceps 5/5, triceps 5/5, wrist flexion 5/5, wrist extension 5/5, hand grip 5/5 RLE: hip flexion 5/5, knee flexion 5/5, knee extension 5/5, ankle dorsiflexion 5/5, plantar flexion 5/5 LLE: hip flexion 5/5, knee flexion 5/5, knee extension 5/5, ankle dorsiflexion 5/5, plantar flexion 5/5 Sensory: Grossly intact to light touch throughout. Reflexes: DTR's 2+. Downgoing toes bilaterally. Coordination: FTN intact. HTS intact. Gait: Deferred   Medications No current facility-administered medications for this encounter.  Current Outpatient Medications:    amoxicillin -clavulanate (AUGMENTIN ) 875-125 MG tablet, Take 1 tablet by mouth every 12 (twelve) hours., Disp: 14 tablet, Rfl: 0   busPIRone  (BUSPAR ) 15 MG tablet, Take 1 tablet (15 mg total) by mouth 3 (three) times daily., Disp: 90 tablet, Rfl: 2   fluticasone  (FLONASE ) 50 MCG/ACT nasal spray, Place 2 sprays into both nostrils daily. (Patient not taking: Reported  on 02/27/2024), Disp: 16 g, Rfl: 0   levothyroxine  (SYNTHROID ) 137 MCG tablet, Take 1 tablet (137 mcg total) by mouth daily at 6 (six) AM., Disp: 30 tablet, Rfl: 0   Multiple Vitamin (MULTI-VITAMIN) tablet, Take 1 tablet by mouth daily., Disp: ,  Rfl:    predniSONE  (DELTASONE ) 20 MG tablet, Take 1 tablet (20 mg total) by mouth 2 (two) times daily with a meal., Disp: 10 tablet, Rfl: 0   sertraline  (ZOLOFT ) 100 MG tablet, Take 1.5 tablets (150 mg total) by mouth daily., Disp: 45 tablet, Rfl: 2  Labs and Diagnostic Imaging   CBC:  Recent Labs  Lab 06/25/24 1222 06/25/24 1223  WBC  --  6.7  NEUTROABS  --  5.1  HGB 14.6 14.2  HCT 43.0 43.9  MCV  --  90.9  PLT  --  191    Basic Metabolic Panel:  Lab Results  Component Value Date   NA 139 06/25/2024   K 3.5 06/25/2024   CO2 24 06/25/2024   GLUCOSE 137 (H) 06/25/2024   BUN 10 06/25/2024   CREATININE 0.82 06/25/2024   CALCIUM  9.2 06/25/2024   GFRNONAA >60 06/25/2024   GFRAA  01/28/2010    >60        The eGFR has been calculated using the MDRD equation. This calculation has not been validated in all clinical situations. eGFR's persistently <60 mL/min signify possible Chronic Kidney Disease.   Lipid Panel: No results found for: LDLCALC HgbA1c:  Lab Results  Component Value Date   HGBA1C (H) 01/27/2010    6.4 (NOTE)                                                                       According to the ADA Clinical Practice Recommendations for 2011, when HbA1c is used as a screening test:   >=6.5%   Diagnostic of Diabetes Mellitus           (if abnormal result  is confirmed)  5.7-6.4%   Increased risk of developing Diabetes Mellitus  References:Diagnosis and Classification of Diabetes Mellitus,Diabetes Care,2011,34(Suppl 1):S62-S69 and Standards of Medical Care in         Diabetes - 2011,Diabetes Care,2011,34  (Suppl 1):S11-S61.   Urine Drug Screen:     Component Value Date/Time   LABOPIA NONE DETECTED 06/25/2024 1419   COCAINSCRNUR NONE DETECTED 06/25/2024 1419   LABBENZ NONE DETECTED 06/25/2024 1419   AMPHETMU NONE DETECTED 06/25/2024 1419   THCU NONE DETECTED 06/25/2024 1419   LABBARB NONE DETECTED 06/25/2024 1419    Alcohol Level     Component Value  Date/Time   ETH <15 06/25/2024 1223   INR  Lab Results  Component Value Date   INR 0.9 06/25/2024   APTT  Lab Results  Component Value Date   APTT 28 06/25/2024   AED levels: No results found for: PHENYTOIN, ZONISAMIDE, LAMOTRIGINE, LEVETIRACETA  CT Head without contrast(Personally reviewed): No acute abnormality.   MRI Brain W/Wo (Personally reviewed): No acute abnormality.   Assessment   Kiaya Haliburton is a 68 y.o. 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 Syncope likely a vasovagal response considering description of her feeling warm all over. Shaking described more consistent with tremulousness than seizure activity. Headache is possibly migrainous considering photophobia. Reassuringly without a structural cause & headache resolved with ativan  and/or sleep.   Of note, she has a diagnosis of hemicrania continua in her chart hwoever as per partner's description of a headache waking her up like clockwork around 2-4am every night in 2018, more consistent with hypnic headache.   Recommendations  - Can follow-up with PCP and outpatient neurologist for headache management.  ______________________________________________________________________   Signed, Jeneane Pieczynski M Maritsa Hunsucker, MD [facility]

## 2024-07-15 NOTE — Progress Notes (Unsigned)
 BH MD/PA/NP OP Progress Note  07/16/2024 4:14 PM Francelia Mclaren  MRN:  [id number]  Visit Diagnosis:    ICD-10-CM   1. MDD (major depressive disorder), recurrent episode, moderate (HCC)  F33.1 busPIRone  (BUSPAR ) 30 MG tablet    sertraline  (ZOLOFT ) 100 MG tablet    2. GAD (generalized anxiety disorder)  F41.1 busPIRone  (BUSPAR ) 30 MG tablet    sertraline  (ZOLOFT ) 100 MG tablet    propranolol (INDERAL) 10 MG tablet       Assessment: Sinahi Knights is a 68 y.o. female with a history of MDD and GAD who presents in person to [facility] at [facility] for initial evaluation on 02/26/2024.    At initial evaluation patient reported symptoms of excessive worry that she is unable to control, difficulty relaxing, restlessness, increased irritability, and fears something awful happening.  She can develop panic symptoms including shortness of breath, chest tightness, and mental fogginess during the anxiety episodes.  In regards to depression patient reported feeling anhedonic, hopeless, amotivated, worthless, and has had disturbed sleep and appetite.  Likely contributing factor is her increased isolation and loss of sense of purpose since she retired from her job in early 2025.  Patient denied any SI or thoughts of self-harm at initial evaluation though had a prior overdose in March 2025.  She denied this being a suicide attempt but had been having suicidal ideation and was found unconscious on the bed with an open bottle of her husband's old opiate medication.  Safety planning has been discussed and patient's husband is controlling all medications now.  There are also firearms in the house though these are safely secured.  Patient has good supports and both her husband and daughter.  She has grandchildren that she list is protective factor.  Halei Hanover presents for follow-up evaluation. Today, 07/16/24, patient reports no significant change in anxiety or depressive symptoms in the  interim.  There is still improvement compared to initial evaluation but she can struggle with intermittent panic attacks in addition to baseline depression/anxiety.  There had been some concern patient did not increase BuSpar  to 3 times daily dosing and was frequently missing midday dose.  Given this we will change dosing to 30 mg twice daily.  We will also titrate Zoloft  to 200 mg daily.  Will start propranolol 10 mg twice daily as needed for anxiety.  Risk and benefits of all adjustments were reviewed.  Recommended patient reconnect with her therapist and increase behavioral activation.  Will follow-up in 2 months.  Psychotherapeutic interventions were used during today's session. From 3:35 PM to 4:10 PM. Therapeutic interventions included empathic listening, supportive therapy, cognitive and behavioral therapy, motivational interviewing. Used supportive interviewing techniques to provide emotional validation. Worked on cognitive reframing techniques and unhelpful thoughts challenged as appropriate. Alternative thoughts developed with guidance. Reviewed techniques to facilitate increased behavioral activation. Improvement was evidenced by patient's participation and identified commitment to therapy goals.   Risk Assessment: An assessment of suicide and violence risk factors was performed as part of this evaluation and is not significantly changed from the last visit. While future psychiatric events cannot be accurately predicted, the patient does not currently require acute inpatient psychiatric care and does not currently meet [facility]  involuntary commitment criteria. Patient was given contact information for crisis resources, behavioral health clinic and was instructed to call 911 for emergencies.   Plan: # GAD/severe MDD recurrent Past medication trials: Duloxetine, Lexapro (he could not sleep), Wellbutrin (hand tremors), Trintellix (nausea and confusion), Viibryd,  Effexor, Ativan , Klonopin ,  hydroxyzine (over sedating), gabapentin Status of problem: Ongoing Interventions: - Increase Zoloft  to 200 mg daily - Change  BuSpar  to 20 mg TID - Start propranolol 10 mg BID prn for anxiety - Restart therapy with Damien Ghazi [phone] - CMP, CBC, B12, folate, lipid panel, A1c, and TSH reviewed  # Dementia/suspected Alzheimer's Past medication trials:  Status of problem: Ongoing Interventions: -- Neuropsych testing scheduled for June 23 with Maude Rattler, was positive for dementia with a strong suspicion of Alzheimer's.  Patient recommended to undergo biomarker testing for confirmation.  Chief Complaint:  Chief Complaint  Patient presents with   Follow-up   HPI: Kyleen presents on her own and was joined by her daughter later in the session.  Patient reports that things are not the best right now.  She is still struggling with a baseline anxiety along with intermittent episodes of increased anxiety.  When the increased anxiety episodes occur she also struggles with memory deficits and forgetfulness.  She has also been experiencing ongoing depressive symptoms and describes feeling miserable when home alone.  This said patient has had improvement compared to initial evaluation.  She no longer constantly monitors her husband's location via phone during the day.  She also has successfully left the house on a number of occasions to go to various places including doctors appointments.  Her concern however is the anxiety that can occur intermittently particularly when leaving the house and how that impacts her ability to recall things such as directions.  She describes her mind feeling like it is in fog these times.  She also expressed worry that this was dementia.  Estera did identify times when her thinking was clear and she felt like herself.  For instance she had recently gone out with her husband and some friends for dinner which she really enjoyed.  She had no difficulty holding conversation  and did not feel any of the priorly endorsed memory concerns.  She also denies anxiety during this time.  Discussed patient's symptoms and potential causes of memory concerns.  While there is some risk of dementia given the description and intermittent nature of declining memory the anxiety is the most likely associated cause.  Provided education around anxiety and discussed some strategies to help improve this.  Notably patient has still not connected with further volunteer organizations.  Encouraged her to do this as the lack of structure/purpose can largely contribute to current depressive symptoms.  Also reviewed other options such as the senior center to increase social connections.  Patient had been connected with a therapist in the past however discontinued over time.  It was recommended that she reconnect that she had the relationship with this provider.  Patient's daughter confirms Ellawyn's report that mood symptoms and anxiety are relatively unchanged in the interim.  She plans to assist [employer] in completing applications for volunteering.  Medication wise patient denies any adverse side effects.  Her daughter notes that there is some suspicion she is not taking the BuSpar  20 mg 3 times a day and may frequently miss the midday dose.  Given this we will change dosing to 30 mg twice a day.  Will titrate Zoloft  to 200 mg daily.  We also discussed adding as needed propranolol for anxiety and reviewed the risk and benefits.  Past Psychiatric History:  Past psychiatric diagnoses: MDD and GAD Psychiatric hospitalizations: Hospitalized following an overdose in March 2025 Past suicide attempts: Overdose in March 2025 patient reports it was accidental Hx  of self harm: Denies Hx of violence towards others: Denies Prior psychiatric providers: [facility] Prior therapy: Sees Damien Ghazi for therapy Access to firearms: Yes they are safely secured in the home  Prior medication trials: duloxetine,  Lexapro (he could not sleep), Wellbutrin (hand tremors), Trintellix (nausea and confusion), Viibryd, Effexor, Ativan , Klonopin , hydroxyzine (over sedating), gabapentin, Ambien (sleep driving), Strattera (itching)  Substance use: Patient reported using alcohol in the past primarily wine.  Drinks were infrequent averaging once a month.  Denies any other substance use including illicit drugs or tobacco.  Past Medical History:  Past Medical History:  Diagnosis Date   Aneurysm    Anxiety    Frequent infections of left ear    Thyroid disease     Past Surgical History:  Procedure Laterality Date   APPENDECTOMY     DESCENDING AORTIC ANEURYSM REPAIR W/ STENT     MYRINGOTOMY Left 2018    Family History:  Family History  Problem Relation Age of Onset   Alzheimer's disease Mother    Hypertension Father    Heart failure Father     Social History:  Social History   Socioeconomic History   Marital status: Married    Spouse name: Not on file   Number of children: Not on file   Years of education: Not on file   Highest education level: Not on file  Occupational History   Not on file  Tobacco Use   Smoking status: Never   Smokeless tobacco: Never  Vaping Use   Vaping status: Never Used  Substance and Sexual Activity   Alcohol use: No   Drug use: No   Sexual activity: Not Currently    Birth control/protection: None  Other Topics Concern   Not on file  Social History Narrative   Not on file   Social Drivers of Health   Financial Resource Strain: Low Risk  (04/21/2022)   Received from [facility] visits prior to [facility]/26/2022[facility]   Overall Financial Resource Strain (CARDIA)    Difficulty of Paying Living Expenses: Not hard at all  Food Insecurity: Patient Unable To Answer (12/08/2023)   Hunger Vital Sign    Worried About Running Out of Food in the Last Year: Patient unable to answer    Ran Out of Food in the Last Year: Patient unable to answer   Transportation Needs: Patient Unable To Answer (12/08/2023)   PRAPARE - Transportation    Lack of Transportation (Medical): Patient unable to answer    Lack of Transportation (Non-Medical): Patient unable to answer  Physical Activity: Insufficiently Active (04/21/2022)   [facility] visits prior to [facility]/26/2022., [facility]   Exercise Vital Sign    On average, how many days per week do you engage in moderate to strenuous exercise (like a brisk walk)?: 4 days    On average, how many minutes do you engage in exercise at this level?: 20 min  Stress: No Stress Concern Present (04/21/2022)   Received from [facility] visits prior to 11/26/2022., [facility]   [employer] of Occupational Health - Occupational Stress Questionnaire    Feeling of Stress : Only a little  Social Connections: Unknown (12/08/2023)   Social Connection and Isolation Panel    Frequency of Communication with Friends and Family: Patient unable to answer    Frequency of Social Gatherings with Friends and Family: Patient unable to answer    Attends Religious Services:  Patient unable to answer    Active Member of Clubs or Organizations: Patient declined    Attends Banker Meetings: Patient unable to answer    Marital Status: Married    Allergies:  Allergies  Allergen Reactions   Iodinated Contrast Media Itching and Rash    CT scan done w/ IV dye on 06/08/12. Per pt / [facility] pt needs to be pre-medicated if needs future CT scans w/IV contrast.  CT scan done w/ IV dye on 06/08/12. Per pt / [facility] pt needs to be pre-medicated if needs future CT scans w/IV contrast.     Ambien [Zolpidem] Other (See Comments)    Drove vehicle without knowledge of driving. From [place], [place]   Prochlorperazine Other (See Comments)   Honey Bee Venom Protein [Honey Bee Venom]    Iodides     Current Medications: Current Outpatient Medications  Medication Sig Dispense Refill    busPIRone  (BUSPAR ) 30 MG tablet Take 1 tablet (30 mg total) by mouth 2 (two) times daily. 60 tablet 2   propranolol (INDERAL) 10 MG tablet Take 1 tablet (10 mg total) by mouth 3 (three) times daily. 30 tablet 2   amoxicillin -clavulanate (AUGMENTIN ) 875-125 MG tablet Take 1 tablet by mouth every 12 (twelve) hours. 14 tablet 0   fluticasone  (FLONASE ) 50 MCG/ACT nasal spray Place 2 sprays into both nostrils daily. (Patient not taking: Reported on 02/27/2024) 16 g 0   levothyroxine  (SYNTHROID ) 137 MCG tablet Take 1 tablet (137 mcg total) by mouth daily at 6 (six) AM. 30 tablet 0   Multiple Vitamin (MULTI-VITAMIN) tablet Take 1 tablet by mouth daily.     predniSONE  (DELTASONE ) 20 MG tablet Take 1 tablet (20 mg total) by mouth 2 (two) times daily with a meal. 10 tablet 0   sertraline  (ZOLOFT ) 100 MG tablet Take 2 tablets (200 mg total) by mouth daily. 60 tablet 2   No current facility-administered medications for this visit.     Musculoskeletal: Strength & Muscle Tone: within normal limits Gait & Station: normal Patient leans: N/A  Psychiatric Specialty Exam: There were no vitals taken for this visit.There is no height or weight on file to calculate BMI. Review of Systems  General Appearance: Well Groomed  Eye Contact:  Good  Speech:  Clear and Coherent and Normal Rate  Volume:  Normal  Mood:  Anxious  Affect:  Appropriate and Congruent  Thought Content: Logical   Suicidal Thoughts:  No  Homicidal Thoughts:  No  Thought Process:  Coherent  Orientation:  Full (Time, Place, and Person)    Memory: Immediate;   Poor  Judgment:  Fair  Insight:  Fair  Concentration:  Concentration: Fair  Recall:  not formally assessed   Fund of Knowledge: Fair  Language: Good  Psychomotor Activity:  Normal  Akathisia:  NA  AIMS (if indicated): not done  Assets:  Communication Skills Desire for Improvement Financial Resources/Insurance  ADL's:  Intact  Cognition: Impaired,  Mild  Sleep:  Good    Metabolic Disorder Labs:  No results found for: PROLACTIN Lab Results  Component Value Date   TRIG 33 12/02/2023   Lab Results  Component Value Date   TSH 0.369 12/13/2023   TSH 3.156 12/04/2023    Therapeutic Level Labs: No results found for: LITHIUM No results found for: VALPROATE No results found for: CBMZ   Screenings: AUDIT    Flowsheet Row Admission (Discharged) from 12/08/2023 in [facility] BE[facility]AL [facility]  Alcohol Use Disorder Identification Test Final  Score (AUDIT) 0   GAD-7    Flowsheet Row [facility]  Total GAD-7 Score 18   PHQ2-9    Flowsheet Row Office Visit from 02/27/2024 [facility]  PHQ-2 Total Score 6  PHQ-9 Total Score 22   Flowsheet Row Office Visit from 02/27/2024 in [facility] from [facility]/15/2024 in [facility] Admission (Discharged) from 12/08/2023 in [facility] BE[facility]AL MEDICINE  C-SSRS RISK CATEGORY High Risk No Risk No Risk    Collaboration of Care: Collaboration of Care: Medication Management AEB medication prescription and Other provider involved in patient's care AEB PCP, ed chart review  Patient/Guardian was advised Release of Information must be obtained prior to any record release in order to collaborate their care with an outside provider. Patient/Guardian was advised if they have not already done so to contact the registration department to sign all necessary forms in order for us  to release information regarding their care.   Consent: Patient/Guardian gives verbal consent for treatment and assignment of benefits for services provided during this visit. Patient/Guardian expressed understanding and agreed to proceed.    Arvella CHRISTELLA Finder, MD 07/16/2024, 4:14 PM

## 2024-07-16 ENCOUNTER — Encounter (HOSPITAL_COMMUNITY): Payer: Self-pay | Admitting: Psychiatry

## 2024-07-16 ENCOUNTER — Ambulatory Visit (HOSPITAL_COMMUNITY): Admitting: Psychiatry

## 2024-07-16 DIAGNOSIS — F331 Major depressive disorder, recurrent, moderate: Secondary | ICD-10-CM | POA: Diagnosis not present

## 2024-07-16 DIAGNOSIS — F411 Generalized anxiety disorder: Secondary | ICD-10-CM | POA: Diagnosis not present

## 2024-07-16 MED ORDER — BUSPIRONE HCL 30 MG PO TABS: 30.0000 mg | ORAL_TABLET | Freq: Two times a day (BID) | ORAL | 2 refills | Status: DC

## 2024-07-16 MED ORDER — SERTRALINE HCL 100 MG PO TABS: 200.0000 mg | ORAL_TABLET | Freq: Every day | ORAL | 2 refills | Status: DC

## 2024-07-16 MED ORDER — PROPRANOLOL HCL 10 MG PO TABS: 10.0000 mg | ORAL_TABLET | Freq: Three times a day (TID) | ORAL | 2 refills | Status: DC

## 2024-07-16 NOTE — Patient Instructions (Addendum)
-   We will increase Zoloft  to 200 mg daily (2 100 mg pills) - Change BuSpar  to 30 mg twice a day (1 pill each) - Start propranolol 10 mg twice a day as needed for anxiety. It takes about 30 minutes to kick in and lasts 4-6 hours - Restart therapy with Damien Ghazi [phone] - Look into volunteering at [facility] [employer] or going to a senior center  We will follow up on 12/16 at 3 pm

## 2024-07-17 ENCOUNTER — Encounter (HOSPITAL_COMMUNITY): Payer: Self-pay | Admitting: Psychiatry

## 2024-07-24 ENCOUNTER — Telehealth (HOSPITAL_COMMUNITY): Payer: Self-pay

## 2024-07-24 ENCOUNTER — Other Ambulatory Visit (HOSPITAL_COMMUNITY): Payer: Self-pay

## 2024-07-24 DIAGNOSIS — F411 Generalized anxiety disorder: Secondary | ICD-10-CM

## 2024-07-24 MED ORDER — PROPRANOLOL HCL 10 MG PO TABS
10.0000 mg | ORAL_TABLET | Freq: Two times a day (BID) | ORAL | 2 refills | Status: AC | PRN
Start: 2024-07-24 — End: ?

## 2024-07-24 NOTE — Telephone Encounter (Signed)
 I resent the prescription for 10 mg - 1 po bid prn #60 x 2. I called the pharmacy and discontinued previous prescription and called the patient to let them know it was corrected.

## 2024-07-24 NOTE — Telephone Encounter (Signed)
 Patients husband is calling, he picked up Propranolol and said that the directions said 3 times a day, but you told them 2 times a day, as needed. They also only gave 30 tablets. Please clarify prescription, thank you

## 2024-08-13 ENCOUNTER — Other Ambulatory Visit (HOSPITAL_COMMUNITY): Payer: Self-pay | Admitting: Psychiatry

## 2024-08-13 DIAGNOSIS — F331 Major depressive disorder, recurrent, moderate: Secondary | ICD-10-CM

## 2024-08-13 DIAGNOSIS — F411 Generalized anxiety disorder: Secondary | ICD-10-CM

## 2024-08-17 ENCOUNTER — Other Ambulatory Visit (HOSPITAL_COMMUNITY): Payer: Self-pay | Admitting: Psychiatry

## 2024-08-17 DIAGNOSIS — F411 Generalized anxiety disorder: Secondary | ICD-10-CM

## 2024-08-17 DIAGNOSIS — F331 Major depressive disorder, recurrent, moderate: Secondary | ICD-10-CM

## 2024-08-20 ENCOUNTER — Ambulatory Visit
Admission: RE | Admit: 2024-08-20 | Discharge: 2024-08-20 | Disposition: A | Discharge status: Home / Self Care | Attending: Emergency Medicine | Admitting: Emergency Medicine

## 2024-08-20 ENCOUNTER — Other Ambulatory Visit: Payer: Self-pay

## 2024-08-20 VITALS — BP 103/66 | HR 76 | Temp 98.3°F | Resp 18 | Ht 66.0 in | Wt 225.0 lb

## 2024-08-20 DIAGNOSIS — H66002 Acute suppurative otitis media without spontaneous rupture of ear drum, left ear: Secondary | ICD-10-CM

## 2024-08-20 MED ORDER — CEFDINIR 300 MG PO CAPS: 300.0000 mg | ORAL_CAPSULE | Freq: Two times a day (BID) | ORAL | 0 refills | Status: AC

## 2024-08-20 MED ORDER — CEFTRIAXONE SODIUM 1 G IJ SOLR
1000.0000 mg | Freq: Once | INTRAMUSCULAR | Status: AC
  Administered 2024-08-20: 1000 mg via INTRAMUSCULAR

## 2024-08-20 NOTE — ED Provider Notes (Signed)
 [name]    CSN: [id number] Arrival date & time: 08/20/24  1642      History   Chief Complaint Chief Complaint  Patient presents with   Otalgia    HPI Amber Cruz is a 68 y.o. female.   Patient presents with left ear pain x 1 month.  Patient states that she was seen for this previously on 11/4 and was prescribed Augmentin  which she states did provide some relief, but then the pain increased and she has noticed some yellow discharge and blood on the Q-tip when she inserted it into her left ear today.  Patient states that she does have a tympanostomy tube in her left ear as well.  Patient states that she does not regularly see an ENT at this time.  The history is provided by the patient and medical records.  Otalgia   Past Medical History:  Diagnosis Date   Aneurysm    Anxiety    Frequent infections of left ear    Thyroid disease     Patient Active Problem List   Diagnosis Date Noted   MDD (major depressive disorder), recurrent episode, severe (HCC) 12/08/2023   Severe episode of recurrent major depressive disorder, without psychotic features (HCC) 12/06/2023   Unresponsive 12/04/2023   Overdose 12/01/2023   DNR (do not resuscitate) 12/01/2023   Cerebral aneurysm 01/29/2018   Acute serous otitis media of left ear 08/21/2017   Controlled type 2 diabetes mellitus without complication, without long-term current use of insulin  (HCC) 08/11/2017   Arthralgia of both hands 09/22/2016   Acquired hypothyroidism 03/26/2014   Adjustment disorder with anxious mood 03/26/2014    Past Surgical History:  Procedure Laterality Date   APPENDECTOMY     DESCENDING AORTIC ANEURYSM REPAIR W/ STENT     MYRINGOTOMY Left 2018    OB History   No obstetric history on file.      Home Medications    Prior to Admission medications   Medication Sig Start Date End Date Taking? Authorizing Provider  cefdinir  (OMNICEF ) 300 MG capsule Take 1 capsule (300 mg total) by  mouth 2 (two) times daily for 7 days. 08/20/24 08/27/24 Yes Zeb Rawl A, NP  busPIRone  (BUSPAR ) 30 MG tablet Take 1 tablet (30 mg total) by mouth 2 (two) times daily. 07/16/24   Carvin Arvella HERO, MD  fluticasone  (FLONASE ) 50 MCG/ACT nasal spray Place 2 sprays into both nostrils daily. Patient not taking: Reported on 02/27/2024 01/15/24   Maranda Jamee Jacob, MD  levothyroxine  (SYNTHROID ) 137 MCG tablet Take 1 tablet (137 mcg total) by mouth daily at 6 (six) AM. 12/15/23   Victoria Ruts, MD  Multiple Vitamin (MULTI-VITAMIN) tablet Take 1 tablet by mouth daily.    [provider]  propranolol  (INDERAL ) 10 MG tablet Take 1 tablet (10 mg total) by mouth 2 (two) times daily as needed. 07/24/24   Carvin Arvella HERO, MD  sertraline  (ZOLOFT ) 100 MG tablet Take 2 tablets (200 mg total) by mouth daily. 07/16/24   Carvin Arvella HERO, MD    Family History Family History  Problem Relation Age of Onset   Alzheimer's disease Mother    Hypertension Father    Heart failure Father     Social History Social History   Tobacco Use   Smoking status: Never   Smokeless tobacco: Never  Vaping Use   Vaping status: Never Used  Substance Use Topics   Alcohol use: No   Drug use: No     Allergies  Iodinated contrast media, Ambien [zolpidem], Prochlorperazine , Honey bee venom protein [honey bee venom], and Iodides   Review of Systems Review of Systems  HENT:  Positive for ear pain.    Per HPI  Physical Exam Triage Vital Signs ED Triage Vitals  Encounter Vitals Group     BP 08/20/24 1654 103/66     Girls Systolic BP Percentile --      Girls Diastolic BP Percentile --      Boys Systolic BP Percentile --      Boys Diastolic BP Percentile --      Pulse Rate 08/20/24 1654 76     Resp 08/20/24 1654 18     Temp 08/20/24 1654 98.3 F (36.8 C)     Temp Source 08/20/24 1654 Oral     SpO2 08/20/24 1654 94 %     Weight 08/20/24 1657 225 lb (102.1 kg)     Height 08/20/24 1657 5' 6 (1.676 m)      Head Circumference --      Peak Flow --      Pain Score 08/20/24 1657 5     Pain Loc --      Pain Education --      Exclude from Growth Chart --    No data found.  Updated Vital Signs BP 103/66 (BP Location: Right Arm)   Pulse 76   Temp 98.3 F (36.8 C) (Oral)   Resp 18   Ht 5' 6 (1.676 m)   Wt 225 lb (102.1 kg)   SpO2 94%   BMI 36.32 kg/m   Visual Acuity Right Eye Distance:   Left Eye Distance:   Bilateral Distance:    Right Eye Near:   Left Eye Near:    Bilateral Near:     Physical Exam Vitals and nursing note reviewed.  Constitutional:      General: She is awake. She is not in acute distress.    Appearance: Normal appearance. She is well-developed and well-groomed. She is not ill-appearing.  HENT:     Left Ear: Tympanic membrane is erythematous and bulging.  Skin:    General: Skin is warm and dry.  Neurological:     Mental Status: She is alert.  Psychiatric:        Behavior: Behavior is cooperative.      UC Treatments / Results  Labs (all labs ordered are listed, but only abnormal results are displayed) Labs Reviewed - No data to display  EKG   Radiology No results found.  Procedures Procedures (including critical care time)  Medications Ordered in UC Medications  cefTRIAXone  (ROCEPHIN ) injection 1,000 mg (has no administration in time range)    Initial Impression / Assessment and Plan / UC Course  I have reviewed the triage vital signs and the nursing notes.  Pertinent labs & imaging results that were available during my care of the patient were reviewed by me and considered in my medical decision making (see chart for details).     Patient is overall well-appearing.  Vitals are stable.  Left TM is significantly erythematous and bulging with tympanostomy tube in place.  GivenIM ceftriaxone  in clinic for initial treatment of recurrent otitis media.  Prescribed cefdinir  for coverage of this.  Given ENT information if needed.  Discussed  follow-up and return precautions. Final Clinical Impressions(s) / UC Diagnoses   Final diagnoses:  Non-recurrent acute suppurative otitis media of left ear without spontaneous rupture of tympanic membrane     Discharge Instructions  You are given an injection of ceftriaxone  today in clinic to treat for persistent ear infection. Tomorrow start taking cefdinir  twice daily for 7 days for additional treatment of this. I have attached information for an ENT that you can follow-up with if you continue to have symptoms for further evaluation. Otherwise follow-up with your primary care provider or return here as needed.    ED Prescriptions     Medication Sig Dispense Auth. Provider   cefdinir  (OMNICEF ) 300 MG capsule Take 1 capsule (300 mg total) by mouth 2 (two) times daily for 7 days. 14 capsule Johnie Flaming A, NP      PDMP not reviewed this encounter.   Johnie Flaming A, NP 08/20/24 1736

## 2024-08-20 NOTE — Discharge Instructions (Addendum)
 You are given an injection of ceftriaxone  today in clinic to treat for persistent ear infection. Tomorrow start taking cefdinir  twice daily for 7 days for additional treatment of this. I have attached information for an ENT that you can follow-up with if you continue to have symptoms for further evaluation. Otherwise follow-up with your primary care provider or return here as needed.

## 2024-08-20 NOTE — ED Triage Notes (Signed)
 Pt presenting c/o pain in her left ear x  1 month. Pt stated she observed some yellow discharge and blood on Q Tip  when she inserted it into her left ear.  Pt stated she currently has a drain in the left ear that she believes is clogged. Pt stated she received Tx  with ABT for same symptoms a few weeks ago. No medication used for present symptoms.

## 2024-08-23 ENCOUNTER — Telehealth: Payer: Self-pay

## 2024-08-23 ENCOUNTER — Ambulatory Visit
Admission: RE | Admit: 2024-08-23 | Discharge: 2024-08-23 | Disposition: A | Discharge status: Home / Self Care | Attending: Internal Medicine | Admitting: Internal Medicine

## 2024-08-23 ENCOUNTER — Other Ambulatory Visit: Payer: Self-pay

## 2024-08-23 VITALS — BP 114/66 | HR 68 | Temp 98.3°F | Resp 16

## 2024-08-23 DIAGNOSIS — H65192 Other acute nonsuppurative otitis media, left ear: Secondary | ICD-10-CM | POA: Diagnosis not present

## 2024-08-23 MED ORDER — CIPROFLOXACIN-DEXAMETHASONE 0.3-0.1 % OT SUSP: 4.0000 [drp] | Freq: Two times a day (BID) | OTIC | 0 refills | Status: AC

## 2024-08-23 MED ORDER — FLUTICASONE PROPIONATE 50 MCG/ACT NA SUSP: 1.0000 | Freq: Every day | NASAL | 0 refills | Status: AC

## 2024-08-23 NOTE — ED Triage Notes (Addendum)
 Continued left ear pain. Has drainage. Reports her pain is worse, is currently on an antibiotic. Has dizziness. Seen 11/25.

## 2024-08-23 NOTE — Telephone Encounter (Signed)
 Pt husband called, sts pt still having pain, is now having ear drainage. Has some dizziness. Still on cefdinir  but not any better. Seen 11/25. Will notify on duty provider Darryle Portugal NP.

## 2024-08-23 NOTE — Discharge Instructions (Signed)
 Continue cefdinir .  I have prescribed antibiotic eardrop as well as Flonase  to help further treat symptoms.  Please follow-up with PCP and ENT.

## 2024-08-23 NOTE — ED Provider Notes (Signed)
 [name]    CSN: [id number] Arrival date & time: 08/23/24  1748      History   Chief Complaint Chief Complaint  Patient presents with   Ear Drainage    2nd visit for ear infection - Entered by patient    HPI Amber Cruz is a 68 y.o. female.   Patient presents with persistent left ear pain.  Patient has been seen twice for these symptoms with one visit on 11/4 at a different urgent care and one on 11/25 at this urgent care.  Patient was prescribed Augmentin  and azelastine nasal spray which did not provide any improvement on 11/4.  She was prescribed cefdinir  at the visit on 11/25 and given IM Rocephin .  She reports that pain has seemed to worsen and she has decreased hearing in that ear.  She does have a TM tube in place but she is not sure when it was placed.   Ear Drainage    Past Medical History:  Diagnosis Date   Aneurysm    Anxiety    Frequent infections of left ear    Thyroid disease     Patient Active Problem List   Diagnosis Date Noted   MDD (major depressive disorder), recurrent episode, severe (HCC) 12/08/2023   Severe episode of recurrent major depressive disorder, without psychotic features (HCC) 12/06/2023   Unresponsive 12/04/2023   Overdose 12/01/2023   DNR (do not resuscitate) 12/01/2023   Cerebral aneurysm 01/29/2018   Acute serous otitis media of left ear 08/21/2017   Controlled type 2 diabetes mellitus without complication, without long-term current use of insulin  (HCC) 08/11/2017   Arthralgia of both hands 09/22/2016   Acquired hypothyroidism 03/26/2014   Adjustment disorder with anxious mood 03/26/2014    Past Surgical History:  Procedure Laterality Date   APPENDECTOMY     DESCENDING AORTIC ANEURYSM REPAIR W/ STENT     MYRINGOTOMY Left 2018    OB History   No obstetric history on file.      Home Medications    Prior to Admission medications   Medication Sig Start Date End Date Taking? Authorizing Provider   ciprofloxacin-dexamethasone (CIPRODEX) OTIC suspension Place 4 drops into the left ear 2 (two) times daily for 7 days. 08/23/24 08/30/24 Yes Lynlee Stratton, Darryle BRAVO, FNP  fluticasone  (FLONASE ) 50 MCG/ACT nasal spray Place 1 spray into both nostrils daily. 08/23/24  Yes Malley Hauter, Darryle BRAVO, FNP  busPIRone  (BUSPAR ) 30 MG tablet Take 1 tablet (30 mg total) by mouth 2 (two) times daily. 07/16/24   Carvin Arvella HERO, MD  cefdinir  (OMNICEF ) 300 MG capsule Take 1 capsule (300 mg total) by mouth 2 (two) times daily for 7 days. 08/20/24 08/27/24  Johnie Flaming A, NP  levothyroxine  (SYNTHROID ) 137 MCG tablet Take 1 tablet (137 mcg total) by mouth daily at 6 (six) AM. 12/15/23   Victoria Ruts, MD  Multiple Vitamin (MULTI-VITAMIN) tablet Take 1 tablet by mouth daily.    [provider]  propranolol  (INDERAL ) 10 MG tablet Take 1 tablet (10 mg total) by mouth 2 (two) times daily as needed. 07/24/24   Carvin Arvella HERO, MD  sertraline  (ZOLOFT ) 100 MG tablet Take 2 tablets (200 mg total) by mouth daily. 07/16/24   Carvin Arvella HERO, MD    Family History Family History  Problem Relation Age of Onset   Alzheimer's disease Mother    Hypertension Father    Heart failure Father     Social History Social History   Tobacco Use  Smoking status: Never   Smokeless tobacco: Never  Vaping Use   Vaping status: Never Used  Substance Use Topics   Alcohol use: No   Drug use: No     Allergies   Iodinated contrast media, Ambien [zolpidem], Prochlorperazine , Honey bee venom protein [honey bee venom], and Iodides   Review of Systems Review of Systems Per HPI  Physical Exam Triage Vital Signs ED Triage Vitals  Encounter Vitals Group     BP 08/23/24 1756 114/66     Girls Systolic BP Percentile --      Girls Diastolic BP Percentile --      Boys Systolic BP Percentile --      Boys Diastolic BP Percentile --      Pulse Rate 08/23/24 1756 68     Resp 08/23/24 1756 16     Temp 08/23/24 1756 98.3 F (36.8 C)      Temp src --      SpO2 08/23/24 1756 95 %     Weight --      Height --      Head Circumference --      Peak Flow --      Pain Score 08/23/24 1800 8     Pain Loc --      Pain Education --      Exclude from Growth Chart --    No data found.  Updated Vital Signs BP 114/66   Pulse 68   Temp 98.3 F (36.8 C)   Resp 16   SpO2 95%   Visual Acuity Right Eye Distance:   Left Eye Distance:   Bilateral Distance:    Right Eye Near:   Left Eye Near:    Bilateral Near:     Physical Exam Constitutional:      General: She is not in acute distress.    Appearance: Normal appearance. She is not toxic-appearing or diaphoretic.  HENT:     Head: Normocephalic and atraumatic.     Left Ear: External ear normal. A PE tube is present. Tympanic membrane is erythematous and bulging. Tympanic membrane is not perforated.  Eyes:     Extraocular Movements: Extraocular movements intact.     Conjunctiva/sclera: Conjunctivae normal.  Pulmonary:     Effort: Pulmonary effort is normal.  Neurological:     General: No focal deficit present.     Mental Status: She is alert and oriented to person, place, and time. Mental status is at baseline.  Psychiatric:        Mood and Affect: Mood normal.        Behavior: Behavior normal.        Thought Content: Thought content normal.        Judgment: Judgment normal.      UC Treatments / Results  Labs (all labs ordered are listed, but only abnormal results are displayed) Labs Reviewed - No data to display  EKG   Radiology No results found.  Procedures Procedures (including critical care time)  Medications Ordered in UC Medications - No data to display  Initial Impression / Assessment and Plan / UC Course  I have reviewed the triage vital signs and the nursing notes.  Pertinent labs & imaging results that were available during my care of the patient were reviewed by me and considered in my medical decision making (see chart for details).      Patient has significant left otitis media with TM tube in place.  Patient advised to continue cefdinir .  Given patient has TM tube in place, will treat with Ciprodex antibiotic eardrops.  Also prescribed Flonase  to help decrease any fluid behind TM.  Advised strict follow-up with her established ENT as soon as possible as well as PCP follow-up.  Patient verbalized understanding and was agreeable with plan. Final Clinical Impressions(s) / UC Diagnoses   Final diagnoses:  Other non-recurrent acute nonsuppurative otitis media of left ear     Discharge Instructions      Continue cefdinir .  I have prescribed antibiotic eardrop as well as Flonase  to help further treat symptoms.  Please follow-up with PCP and ENT.    ED Prescriptions     Medication Sig Dispense Auth. Provider   fluticasone  (FLONASE ) 50 MCG/ACT nasal spray Place 1 spray into both nostrils daily. 16 g Upham, Loraina Stauffer E, FNP   ciprofloxacin-dexamethasone (CIPRODEX) OTIC suspension Place 4 drops into the left ear 2 (two) times daily for 7 days. 7.5 mL Hazen Darryle BRAVO, [place]      PDMP not reviewed this encounter.   Hazen Darryle BRAVO, [place] 08/23/24 [phone]

## 2024-08-23 NOTE — Telephone Encounter (Signed)
 Per NP, pt needs to f/u with ENT or PCP. To have change in abx must be seen by NP since she has not gotten better on 3 abx now. Pt husband notified, sts he will bring patient back in to be seen.

## 2024-09-09 NOTE — Progress Notes (Unsigned)
 BH MD/PA/NP OP Progress Note  09/10/2024 4:03 PM Amber Cruz  MRN:  [id number]  Visit Diagnosis:    ICD-10-CM   1. MDD (major depressive disorder), recurrent episode, moderate (HCC)  F33.1 busPIRone  (BUSPAR ) 30 MG tablet    sertraline  (ZOLOFT ) 100 MG tablet    2. GAD (generalized anxiety disorder)  F41.1 busPIRone  (BUSPAR ) 30 MG tablet    sertraline  (ZOLOFT ) 100 MG tablet      Assessment: Amber Cruz is a 68 y.o. female with a history of MDD and GAD who presents in person to [facility] for initial evaluation on 02/26/2024.    At initial evaluation patient reported symptoms of excessive worry that she is unable to control, difficulty relaxing, restlessness, increased irritability, and fears something awful happening.  She can develop panic symptoms including shortness of breath, chest tightness, and mental fogginess during the anxiety episodes.  In regards to depression patient reported feeling anhedonic, hopeless, amotivated, worthless, and has had disturbed sleep and appetite.  Likely contributing factor is her increased isolation and loss of sense of purpose since she retired from her job in early 2025.  Patient denied any SI or thoughts of self-harm at initial evaluation though had a prior overdose in March 2025.  She denied this being a suicide attempt but had been having suicidal ideation and was found unconscious on the bed with an open bottle of her husband's old opiate medication.  Safety planning has been discussed and patient's husband is controlling all medications now.  There are also firearms in the house though these are safely secured.  Patient has good supports and both her husband and daughter.  She has grandchildren that she list is protective factor.  Amber Cruz presents for follow-up evaluation. Today, 09/10/2024, patient is still anxious on presentation though was more composed and less tearful compared to previous appointments.   Anxiety symptoms still primarily occur when isolated and patient's had minimal increase in the behavioral activation.  Panic episodes have improved compared to last visit.  She also has improved medication compliance with sertraline  and BuSpar .  She has used propranolol  couple times with good effect and denies adverse side effects.  Will continue on current regimen and follow-up in 2 months.  Patient encouraged to continue to work towards increasing behavioral activation and to reconnect with her therapist.  Psychotherapeutic interventions were used during today's session. From 3:06 PM to 3:36 PM. Therapeutic interventions included empathic listening, supportive therapy, cognitive and behavioral therapy, motivational interviewing. Used supportive interviewing techniques to provide emotional validation. Worked on cognitive reframing techniques and unhelpful thoughts challenged as appropriate. Alternative thoughts developed with guidance. Reviewed techniques to facilitate increased behavioral activation. Improvement was evidenced by patient's participation and identified commitment to therapy goals.   Risk Assessment: An assessment of suicide and violence risk factors was performed as part of this evaluation and is not significantly changed from the last visit. While future psychiatric events cannot be accurately predicted, the patient does not currently require acute inpatient psychiatric care and does not currently meet [place]  involuntary commitment criteria. Patient was given contact information for crisis resources, behavioral health clinic and was instructed to call 911 for emergencies.   Plan: # GAD/severe MDD recurrent Past medication trials: Duloxetine, Lexapro (he could not sleep), Wellbutrin (hand tremors), Trintellix (nausea and confusion), Viibryd, Effexor, Ativan , Klonopin , hydroxyzine (over sedating), gabapentin Status of problem: Ongoing Interventions: - Continue Zoloft  200 mg  daily - Continue BuSpar  30 mg BID - Continue  propranolol   10 mg BID prn for anxiety - Restart therapy with Damien Ghazi [phone] - CMP, CBC, B12, folate, lipid panel, A1c, and TSH reviewed  # Dementia/suspected Alzheimer's Past medication trials:  Status of problem: Ongoing Interventions: -- Neuropsych testing scheduled for June 23 with Maude Rattler, was positive for dementia with a strong suspicion of Alzheimer's.  Patient recommended to undergo biomarker testing for confirmation.  Chief Complaint:  Chief Complaint  Patient presents with   Follow-up   HPI: Amber Cruz presents on her own and was joined by her husband who participated with patients permission.  Patient reports that she is still struggling with significant anxiety symptoms and that they are currently worse.  She does typically experience an increase in anxiety leading up to her psychiatry appointments.  At home the anxiety still present and more noticeable when she is by herself.  When she does get anxious she describes feeling a pressure in her head and mental clouding.  This occurs when she thinks about being isolated or when thinking about driving and getting lost.  Anxiety symptoms improved when patient is outside of the house or with other people.  The mental fogginess and memory concerns tend to improve during this time.  Reviewed structure and behavioral activation.  Per patient she does leave the house a few times a week often on her own.  Amber Cruz endorses having reach out to [facility] the senior center but being told that she has to wait several months still.  Patient's husband is unaware of her reaching out to these organizations.  Recommended reaching out to organizations again with either daughter or husband present.  If data is accurate then could consider alternative places for instance different senior centers.  Patient is taking medications consistently and denies adverse side effects.  She is only use  propranolol  a few times in the interim.  Per patient she has not noticed significant difference with the titration of sertraline  but did find the as needed propranolol  helpful.  She is only taking it a couple times but describes it as a soothing effect for her anxiety.  Husband reports he has noticed significant improvement on the sertraline  and BuSpar  compared to patient previously.  We also reviewed the propranolol  and recommended using the as needed medication more consistently.  Suggested patient have 4 or 5 pills on hand to be taken as needed.  Currently has been controlled with the medications since her overdose.  Patient has an upcoming neurology appointment next week to evaluate the neuropsych testing.  Past Psychiatric History:  Past psychiatric diagnoses: MDD and GAD Psychiatric hospitalizations: Hospitalized following an overdose in March 2025 Past suicide attempts: Overdose in March 2025 patient reports it was accidental Hx of self harm: Denies Hx of violence towards others: Denies Prior psychiatric providers: [facility] Prior therapy: Sees Damien Ghazi for therapy Access to firearms: Yes they are safely secured in the home  Prior medication trials: duloxetine, Lexapro (he could not sleep), Wellbutrin (hand tremors), Trintellix (nausea and confusion), Viibryd, Effexor, Ativan , Klonopin , hydroxyzine (over sedating), gabapentin, Ambien (sleep driving), Strattera (itching)  Substance use: Patient reported using alcohol in the past primarily wine.  Drinks were infrequent averaging once a month.  Denies any other substance use including illicit drugs or tobacco.  Past Medical History:  Past Medical History:  Diagnosis Date   Aneurysm    Anxiety    Frequent infections of left ear    Thyroid disease     Past Surgical History:  Procedure Laterality Date  APPENDECTOMY     DESCENDING AORTIC ANEURYSM REPAIR W/ STENT     MYRINGOTOMY Left 2018    Family History:  Family History   Problem Relation Age of Onset   Alzheimer's disease Mother    Hypertension Father    Heart failure Father     Social History:  Social History   Socioeconomic History   Marital status: Married    Spouse name: Not on file   Number of children: Not on file   Years of education: Not on file   Highest education level: Not on file  Occupational History   Not on file  Tobacco Use   Smoking status: Never   Smokeless tobacco: Never  Vaping Use   Vaping status: Never Used  Substance and Sexual Activity   Alcohol use: No   Drug use: No   Sexual activity: Not Currently    Birth control/protection: None  Other Topics Concern   Not on file  Social History Narrative   Not on file   Social Drivers of Health   Tobacco Use: Low Risk (08/23/2024)   Patient History    Smoking Tobacco Use: Never    Smokeless Tobacco Use: Never    Passive Exposure: Not on file  Financial Resource Strain: Low Risk (04/21/2022)   Received from [facility]   Overall Financial Resource Strain (CARDIA)    Difficulty of Paying Living Expenses: Not hard at all  Food Insecurity: Patient Unable To Answer (12/08/2023)   Hunger Vital Sign    Worried About Running Out of Food in the Last Year: Patient unable to answer    Ran Out of Food in the Last Year: Patient unable to answer  Transportation Needs: Patient Unable To Answer (12/08/2023)   PRAPARE - Transportation    Lack of Transportation (Medical): Patient unable to answer    Lack of Transportation (Non-Medical): Patient unable to answer  Physical Activity: Insufficiently Active (04/21/2022)   Received from [facility] visits prior to 11/26/2022., [facility]   Exercise Vital Sign    On average, how many days per week do you engage in moderate to strenuous exercise (like a brisk walk)?: 4 days    On average, how many minutes do you engage in exercise at this level?: 20 min  Stress: No Stress Concern Present (04/21/2022)   Received from  [facility] visits prior to 11/26/2022., [facility]   [employer] of Occupational Health - Occupational Stress Questionnaire    Feeling of Stress : Only a little  Social Connections: Unknown (12/08/2023)   Social Connection and Isolation Panel    Frequency of Communication with Friends and Family: Patient unable to answer    Frequency of Social Gatherings with Friends and Family: Patient unable to answer    Attends Religious Services: Patient unable to answer    Active Member of Clubs or Organizations: Patient declined    Attends Banker Meetings: Patient unable to answer    Marital Status: Married  Depression (PHQ2-9): High Risk (02/28/2024)   Depression (PHQ2-9)    PHQ-2 Score: 22  Alcohol Screen: Low Risk (12/08/2023)   Alcohol Screen    Last Alcohol Screening Score (AUDIT): 0  Housing: Patient Unable To Answer (12/08/2023)   Housing Stability Vital Sign    Unable to Pay for Housing in the Last Year: Patient unable to answer    Number of Times Moved in the Last Year: Not on file    Homeless in  the Last Year: Patient unable to answer  Utilities: Patient Unable To Answer (12/08/2023)   AHC Utilities    Threatened with loss of utilities: Patient unable to answer  Health Literacy: Not on file    Allergies:  Allergies  Allergen Reactions   Iodinated Contrast Media Itching and Rash    CT scan done w/ IV dye on 06/08/12. Per pt / [facility] pt needs to be pre-medicated if needs future CT scans w/IV contrast.  CT scan done w/ IV dye on 06/08/12. Per pt / [facility] pt needs to be pre-medicated if needs future CT scans w/IV contrast.     Ambien [Zolpidem] Other (See Comments)    Drove vehicle without knowledge of driving. From [place], [place]   Prochlorperazine  Other (See Comments)   Honey Bee Venom Protein [Honey Bee Venom]    Iodides     Current Medications: Current Outpatient Medications  Medication Sig Dispense Refill   busPIRone  (BUSPAR ) 30 MG tablet  Take 1 tablet (30 mg total) by mouth 2 (two) times daily. 60 tablet 2   fluticasone  (FLONASE ) 50 MCG/ACT nasal spray Place 1 spray into both nostrils daily. 16 g 0   levothyroxine  (SYNTHROID ) 137 MCG tablet Take 1 tablet (137 mcg total) by mouth daily at 6 (six) AM. 30 tablet 0   Multiple Vitamin (MULTI-VITAMIN) tablet Take 1 tablet by mouth daily.     propranolol  (INDERAL ) 10 MG tablet Take 1 tablet (10 mg total) by mouth 2 (two) times daily as needed. 60 tablet 2   sertraline  (ZOLOFT ) 100 MG tablet Take 2 tablets (200 mg total) by mouth daily. 60 tablet 2   No current facility-administered medications for this visit.     Musculoskeletal: Strength & Muscle Tone: within normal limits Gait & Station: normal Patient leans: N/A  Psychiatric Specialty Exam: Blood pressure (!) 140/81, pulse 64, height 5' 6 (1.676 m), weight 239 lb (108.4 kg).Body mass index is 38.58 kg/m. Review of Systems  General Appearance: Well Groomed  Eye Contact:  Good  Speech:  Clear and Coherent and Normal Rate  Volume:  Normal  Mood:  Anxious  Affect:  Appropriate and Congruent  Thought Content: Logical   Suicidal Thoughts:  No  Homicidal Thoughts:  No  Thought Process:  Coherent  Orientation:  Full (Time, Place, and Person)    Memory: Immediate;   Poor  Judgment:  Fair  Insight:  Fair  Concentration:  Concentration: Fair  Recall:  not formally assessed   Fund of Knowledge: Fair  Language: Good  Psychomotor Activity:  Normal  Akathisia:  NA  AIMS (if indicated): not done  Assets:  Communication Skills Desire for Improvement Financial Resources/Insurance  ADL's:  Intact  Cognition: Impaired,  Mild  Sleep:  Good   Metabolic Disorder Labs:  No results found for: PROLACTIN Lab Results  Component Value Date   TRIG 33 12/02/2023   Lab Results  Component Value Date   TSH 0.369 12/13/2023   TSH 3.156 12/04/2023    Therapeutic Level Labs: No results found for: LITHIUM No results found  for: VALPROATE No results found for: CBMZ   Screenings: AUDIT    Flowsheet Row Admission (Discharged) from 12/08/2023 in [facility]  Alcohol Use Disorder Identification Test Final Score (AUDIT) 0   GAD-7    Flowsheet Row [facility]  Total GAD-7 Score 18   PHQ2-9    Flowsheet Row [facility]  PHQ-2 Total Score 6  PHQ-9 Total Score 22   Flowsheet Row UC from 08/23/2024 in [facility] [place] UC from 08/20/2024 in [facility] Visit from 02/27/2024 [facility]  C-SSRS RISK CATEGORY No Risk No Risk High Risk    Collaboration of Care: Collaboration of Care: Medication Management AEB medication prescription and Other provider involved in patient's care AEB urgent care, endocrinology chart review  Patient/Guardian was advised Release of Information must be obtained prior to any record release in order to collaborate their care with an outside provider. Patient/Guardian was advised if they have not already done so to contact the registration department to sign all necessary forms in order for us  to release information regarding their care.   Consent: Patient/Guardian gives verbal consent for treatment and assignment of benefits for services provided during this visit. Patient/Guardian expressed understanding and agreed to proceed.    Arvella CHRISTELLA Finder, MD 09/10/2024, 4:03 PM

## 2024-09-10 ENCOUNTER — Other Ambulatory Visit: Payer: Self-pay

## 2024-09-10 ENCOUNTER — Ambulatory Visit (HOSPITAL_COMMUNITY): Admitting: Psychiatry

## 2024-09-10 DIAGNOSIS — F331 Major depressive disorder, recurrent, moderate: Secondary | ICD-10-CM | POA: Diagnosis not present

## 2024-09-10 DIAGNOSIS — F411 Generalized anxiety disorder: Secondary | ICD-10-CM

## 2024-09-10 MED ORDER — BUSPIRONE HCL 30 MG PO TABS: 30.0000 mg | ORAL_TABLET | Freq: Two times a day (BID) | ORAL | 2 refills | Status: AC

## 2024-09-10 MED ORDER — SERTRALINE HCL 100 MG PO TABS: 200.0000 mg | ORAL_TABLET | Freq: Every day | ORAL | 2 refills | Status: AC

## 2024-09-18 ENCOUNTER — Other Ambulatory Visit (HOSPITAL_COMMUNITY): Payer: Self-pay | Admitting: Psychiatry

## 2024-09-18 DIAGNOSIS — F411 Generalized anxiety disorder: Secondary | ICD-10-CM

## 2024-09-18 DIAGNOSIS — F331 Major depressive disorder, recurrent, moderate: Secondary | ICD-10-CM

## 2024-10-03 ENCOUNTER — Other Ambulatory Visit (HOSPITAL_COMMUNITY): Payer: Self-pay | Admitting: Psychiatry

## 2024-10-03 DIAGNOSIS — F331 Major depressive disorder, recurrent, moderate: Secondary | ICD-10-CM

## 2024-10-03 DIAGNOSIS — F411 Generalized anxiety disorder: Secondary | ICD-10-CM

## 2024-10-12 ENCOUNTER — Ambulatory Visit
Admission: RE | Admit: 2024-10-12 | Discharge: 2024-10-12 | Disposition: A | Discharge status: Home / Self Care | Source: Ambulatory Visit | Attending: Family Medicine | Admitting: Family Medicine

## 2024-10-12 VITALS — BP 111/70 | HR 62 | Temp 99.6°F | Resp 18 | Ht 66.0 in | Wt 260.0 lb

## 2024-10-12 DIAGNOSIS — J0100 Acute maxillary sinusitis, unspecified: Secondary | ICD-10-CM | POA: Diagnosis not present

## 2024-10-12 LAB — POCT INFLUENZA A/B
Influenza A, POC: NEGATIVE
Influenza B, POC: NEGATIVE

## 2024-10-12 LAB — POC SOFIA SARS ANTIGEN FIA: SARS Coronavirus 2 Ag: NEGATIVE

## 2024-10-12 MED ORDER — AMOXICILLIN-POT CLAVULANATE 875-125 MG PO TABS: 1.0000 | ORAL_TABLET | Freq: Two times a day (BID) | ORAL | 0 refills | Status: AC

## 2024-10-12 NOTE — Discharge Instructions (Addendum)
 Advised patient take medication as directed with food to completion.  Encouraged to increase daily water intake to 64 ounces per day while taking this medication.  Advised if symptoms worsen and/or unresolved please follow-up with PCP or here for further evaluation.

## 2024-10-12 NOTE — ED Provider Notes (Signed)
 " Amber Cruz    CSN: [id number] Arrival date & time: 10/12/24  1443      History   Chief Complaint Chief Complaint  Patient presents with   URI    Chest /sinus congestion - Entered by patient    HPI Amber Cruz is a 69 y.o. female.   HPI Very pleasant 68 year old female presents with sinus nasal congestion, sore throat and cough for 3 to 4 days.  PMH significant for obesity, T2 DM without complication, cerebral aneurysm, and MDD.  Past Medical History:  Diagnosis Date   Aneurysm    Anxiety    Frequent infections of left ear    Thyroid disease     Patient Active Problem List   Diagnosis Date Noted   MDD (major depressive disorder), recurrent episode, severe (HCC) 12/08/2023   Severe episode of recurrent major depressive disorder, without psychotic features (HCC) 12/06/2023   Unresponsive 12/04/2023   Overdose 12/01/2023   DNR (do not resuscitate) 12/01/2023   Cerebral aneurysm 01/29/2018   Acute serous otitis media of left ear 08/21/2017   Controlled type 2 diabetes mellitus without complication, without long-term current use of insulin  (HCC) 08/11/2017   Arthralgia of both hands 09/22/2016   Acquired hypothyroidism 03/26/2014   Adjustment disorder with anxious mood 03/26/2014    Past Surgical History:  Procedure Laterality Date   APPENDECTOMY     DESCENDING AORTIC ANEURYSM REPAIR W/ STENT     MYRINGOTOMY Left 2018    OB History   No obstetric history on file.      Home Medications    Prior to Admission medications  Medication Sig Start Date End Date Taking? Authorizing Provider  amoxicillin -clavulanate (AUGMENTIN ) 875-125 MG tablet Take 1 tablet by mouth 2 (two) times daily. 10/12/24  Yes Teddy Sharper, FNP  busPIRone  (BUSPAR ) 30 MG tablet Take 1 tablet (30 mg total) by mouth 2 (two) times daily. 09/10/24  Yes Carvin Arvella HERO, MD  fluticasone  (FLONASE ) 50 MCG/ACT nasal spray Place 1 spray into both nostrils daily. 08/23/24  Yes [place],  Darryle BRAVO, FNP  levothyroxine  (SYNTHROID ) 137 MCG tablet Take 1 tablet (137 mcg total) by mouth daily at 6 (six) AM. 12/15/23  Yes Victoria Ruts, MD  Multiple Vitamin (MULTI-VITAMIN) tablet Take 1 tablet by mouth daily.   Yes [provider]  propranolol  (INDERAL ) 10 MG tablet Take 1 tablet (10 mg total) by mouth 2 (two) times daily as needed. 07/24/24  Yes Carvin Arvella HERO, MD  sertraline  (ZOLOFT ) 100 MG tablet Take 2 tablets (200 mg total) by mouth daily. 09/10/24  Yes Carvin Arvella HERO, MD    Family History Family History  Problem Relation Age of Onset   Alzheimer's disease Mother    Hypertension Father    Heart failure Father     Social History Social History[1]   Allergies   Iodinated contrast media, Ambien [zolpidem], Prochlorperazine , Honey bee venom protein [honey bee venom], and Iodides   Review of Systems Review of Systems  HENT:  Positive for congestion.   Respiratory:  Positive for cough.   All other systems reviewed and are negative.    Physical Exam Triage Vital Signs ED Triage Vitals  Encounter Vitals Group     BP      Girls Systolic BP Percentile      Girls Diastolic BP Percentile      Boys Systolic BP Percentile      Boys Diastolic BP Percentile      Pulse  Resp      Temp      Temp src      SpO2      Weight      Height      Head Circumference      Peak Flow      Pain Score      Pain Loc      Pain Education      Exclude from Growth Chart    No data found.  Updated Vital Signs BP 111/70 (BP Location: Right Arm)   Pulse 62   Temp 99.6 F (37.6 C) (Oral)   Resp 18   Ht 5' 6 (1.676 m)   Wt 260 lb (117.9 kg)   SpO2 93%   BMI 41.97 kg/m    Physical Exam Vitals and nursing note reviewed.  Constitutional:      Appearance: Normal appearance. She is obese. She is ill-appearing.  HENT:     Head: Normocephalic and atraumatic.     Right Ear: Tympanic membrane and external ear normal.     Left Ear: Tympanic membrane and  external ear normal.     Ears:     Comments: Significant eustachian tube dysfunction noted bilateral    Mouth/Throat:     Mouth: Mucous membranes are moist.     Pharynx: Oropharynx is clear.  Eyes:     Extraocular Movements: Extraocular movements intact.     Conjunctiva/sclera: Conjunctivae normal.     Pupils: Pupils are equal, round, and reactive to light.  Cardiovascular:     Rate and Rhythm: Normal rate and regular rhythm.     Heart sounds: Normal heart sounds.  Pulmonary:     Effort: Pulmonary effort is normal.     Breath sounds: Normal breath sounds. No wheezing, rhonchi or rales.  Musculoskeletal:        General: Normal range of motion.  Skin:    General: Skin is warm and dry.  Neurological:     General: No focal deficit present.     Mental Status: She is alert and oriented to person, place, and time. Mental status is at baseline.  Psychiatric:        Mood and Affect: Mood normal.        Behavior: Behavior normal.        Thought Content: Thought content normal.      UC Treatments / Results  Labs (all labs ordered are listed, but only abnormal results are displayed) Labs Reviewed  POCT INFLUENZA A/B - Normal  POC SOFIA SARS ANTIGEN FIA - Normal    EKG   Radiology No results found.  Procedures Procedures (including critical Cruz time)  Medications Ordered in UC Medications - No data to display  Initial Impression / Assessment and Plan / UC Course  I have reviewed the triage vital signs and the nursing notes.  Pertinent labs & imaging results that were available during my Cruz of the patient were reviewed by me and considered in my medical decision making (see chart for details).     MDM: 1.  Acute non-recurrent maxillary sinusitis-Rx'd Augmentin  875/125 mg tablet: Take 1 tablet twice daily x 7 days. Advised patient take medication as directed with food to completion.  Encouraged to increase daily water  intake to 64 ounces per day while taking this  medication.  Advised if symptoms worsen and/or unresolved please follow-up with PCP or here for further evaluation.  Patient discharged home, hemodynamically stable.  Final Clinical Impressions(s) / UC Diagnoses  Final diagnoses:  Acute non-recurrent maxillary sinusitis     Discharge Instructions      Advised patient take medication as directed with food to completion.  Encouraged to increase daily water  intake to 64 ounces per day while taking this medication.  Advised if symptoms worsen and/or unresolved please follow-up with PCP or here for further evaluation.     ED Prescriptions     Medication Sig Dispense Auth. Provider   amoxicillin -clavulanate (AUGMENTIN ) 875-125 MG tablet Take 1 tablet by mouth 2 (two) times daily. 14 tablet Willi Borowiak, FNP      PDMP not reviewed this encounter.    [1]  Social History Tobacco Use   Smoking status: Never   Smokeless tobacco: Never  Vaping Use   Vaping status: Never Used  Substance Use Topics   Alcohol use: No   Drug use: No     Teddy Sharper, FNP 10/12/24 1550  "

## 2024-10-12 NOTE — ED Triage Notes (Signed)
 Patient c/o possible sinus and head congestion, febrile at times, slight nasal drainage and sore throat.  Productive cough at times.  Sx's have been going on about 3 days.  Patient has taken Tylenol .

## 2024-10-20 ENCOUNTER — Other Ambulatory Visit (HOSPITAL_COMMUNITY): Payer: Self-pay | Admitting: Psychiatry

## 2024-10-20 DIAGNOSIS — F411 Generalized anxiety disorder: Secondary | ICD-10-CM
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
## 2024-10-22 ENCOUNTER — Ambulatory Visit (HOSPITAL_COMMUNITY): Admitting: Psychiatry

## 2024-11-14 ENCOUNTER — Ambulatory Visit (HOSPITAL_COMMUNITY): Admitting: Psychiatry
